# Patient Record
Sex: Female | Born: 1975 | Race: Black or African American | Hispanic: No | Marital: Married | Smoking: Current some day smoker
Health system: Southern US, Community
[De-identification: ages and names within clinical notes are randomized; demographics above are authoritative.]

## PROBLEM LIST (undated history)

## (undated) DIAGNOSIS — M549 Dorsalgia, unspecified: Secondary | ICD-10-CM

## (undated) DIAGNOSIS — Z720 Tobacco use: Secondary | ICD-10-CM

## (undated) DIAGNOSIS — R7303 Prediabetes: Secondary | ICD-10-CM

## (undated) DIAGNOSIS — R0683 Snoring: Secondary | ICD-10-CM

## (undated) DIAGNOSIS — J302 Other seasonal allergic rhinitis: Secondary | ICD-10-CM

## (undated) DIAGNOSIS — M2550 Pain in unspecified joint: Secondary | ICD-10-CM

## (undated) DIAGNOSIS — B9689 Other specified bacterial agents as the cause of diseases classified elsewhere: Secondary | ICD-10-CM

## (undated) DIAGNOSIS — N939 Abnormal uterine and vaginal bleeding, unspecified: Secondary | ICD-10-CM

## (undated) DIAGNOSIS — D219 Benign neoplasm of connective and other soft tissue, unspecified: Secondary | ICD-10-CM

## (undated) DIAGNOSIS — N760 Acute vaginitis: Secondary | ICD-10-CM

## (undated) DIAGNOSIS — K5900 Constipation, unspecified: Secondary | ICD-10-CM

## (undated) DIAGNOSIS — E049 Nontoxic goiter, unspecified: Secondary | ICD-10-CM

## (undated) DIAGNOSIS — G4730 Sleep apnea, unspecified: Secondary | ICD-10-CM

## (undated) DIAGNOSIS — T7840XA Allergy, unspecified, initial encounter: Secondary | ICD-10-CM

## (undated) HISTORY — DX: Abnormal uterine and vaginal bleeding, unspecified: N93.9

## (undated) HISTORY — DX: Sleep apnea, unspecified: G47.30

## (undated) HISTORY — DX: Allergy, unspecified, initial encounter: T78.40XA

## (undated) HISTORY — DX: Pain in unspecified joint: M25.50

## (undated) HISTORY — DX: Tobacco use: Z72.0

## (undated) HISTORY — DX: Dorsalgia, unspecified: M54.9

## (undated) HISTORY — DX: Acute vaginitis: B96.89

## (undated) HISTORY — DX: Snoring: R06.83

## (undated) HISTORY — DX: Nontoxic goiter, unspecified: E04.9

## (undated) HISTORY — DX: Prediabetes: R73.03

## (undated) HISTORY — DX: Other specified bacterial agents as the cause of diseases classified elsewhere: N76.0

## (undated) HISTORY — PX: TUBAL LIGATION: SHX77

## (undated) HISTORY — DX: Constipation, unspecified: K59.00

---

## 2017-12-04 DIAGNOSIS — H40033 Anatomical narrow angle, bilateral: Secondary | ICD-10-CM | POA: Diagnosis not present

## 2017-12-04 DIAGNOSIS — H16223 Keratoconjunctivitis sicca, not specified as Sjogren's, bilateral: Secondary | ICD-10-CM | POA: Diagnosis not present

## 2017-12-05 DIAGNOSIS — H5213 Myopia, bilateral: Secondary | ICD-10-CM | POA: Diagnosis not present

## 2017-12-22 DIAGNOSIS — H1013 Acute atopic conjunctivitis, bilateral: Secondary | ICD-10-CM | POA: Diagnosis not present

## 2017-12-22 DIAGNOSIS — H5203 Hypermetropia, bilateral: Secondary | ICD-10-CM | POA: Diagnosis not present

## 2017-12-25 ENCOUNTER — Encounter (HOSPITAL_COMMUNITY): Payer: Self-pay | Admitting: Emergency Medicine

## 2017-12-25 ENCOUNTER — Ambulatory Visit (HOSPITAL_COMMUNITY)
Admission: EM | Admit: 2017-12-25 | Discharge: 2017-12-25 | Disposition: A | Discharge status: Home / Self Care | Payer: Medicaid Other | Attending: Emergency Medicine | Admitting: Emergency Medicine

## 2017-12-25 DIAGNOSIS — N898 Other specified noninflammatory disorders of vagina: Secondary | ICD-10-CM | POA: Diagnosis not present

## 2017-12-25 DIAGNOSIS — N760 Acute vaginitis: Secondary | ICD-10-CM

## 2017-12-25 DIAGNOSIS — Z792 Long term (current) use of antibiotics: Secondary | ICD-10-CM | POA: Diagnosis not present

## 2017-12-25 DIAGNOSIS — F17200 Nicotine dependence, unspecified, uncomplicated: Secondary | ICD-10-CM | POA: Insufficient documentation

## 2017-12-25 DIAGNOSIS — J029 Acute pharyngitis, unspecified: Secondary | ICD-10-CM

## 2017-12-25 LAB — POCT URINALYSIS DIP (DEVICE)
Bilirubin Urine: NEGATIVE
Glucose, UA: NEGATIVE mg/dL
Hgb urine dipstick: NEGATIVE
Ketones, ur: NEGATIVE mg/dL
Leukocytes, UA: NEGATIVE
Nitrite: NEGATIVE
Protein, ur: NEGATIVE mg/dL
Specific Gravity, Urine: >=1.03 (ref 1.005–1.030)
Urobilinogen, UA: 0.2 mg/dL (ref 0.0–1.0)
pH: 5.5 (ref 5.0–8.0)

## 2017-12-25 MED ORDER — METRONIDAZOLE 500 MG PO TABS: 500.0000 mg | ORAL_TABLET | Freq: Two times a day (BID) | ORAL | 0 refills | Status: AC

## 2017-12-25 NOTE — ED Triage Notes (Signed)
Pt c/o sore throat and vaginal discharge

## 2017-12-25 NOTE — Discharge Instructions (Addendum)
Negative rapid strep here in clinic tonight. Culture has been sent and is in process to confirm this. This appears likely a viral pharyngitis.  Throat lozenges, gargles, chloraseptic spray, warm teas, popsicles etc to help with throat pain.   Your urine is completely normal for indication of bladder or urinary tract infection. Vaginal cytology is pending. Will notify you of any positive findings and if any changes to treatment are needed.  I have sent for BV treatment to be started in the mean time. Do not drink alcohol while taking.

## 2017-12-25 NOTE — ED Provider Notes (Signed)
[place]    CSN: [id number] Arrival date & time: 12/25/17  1752     History   Chief Complaint Chief Complaint  Patient presents with  . Sore Throat  . Vaginal Discharge    HPI Chelsea Henson is a 42 y.o. female.   Chelsea Henson presents witch complaints of sore throat. Started out as itching approximately 1 week ago with runny nose and runny eyes. Felt well over the past few days but today at work her throat increased in pain, up to 6/10 in severity. Felt similar to strep she has had in the past. No known fevers. No gi/gu complaints. No cough. No ear pain. Mild back ache. Took tylenol yesterday which helped. She works in a [employer] and has had ill coworkers.   Also complaints of brown vaginal discharge which started 1 week ago. Similar to BV she has had in the past. States she is sensitive to public toilet paper and had to use it prior to onset of symptoms. No specific urinary symptoms. Denies concerns for STDs.     ROS per HPI.      History reviewed. No pertinent past medical history.  There are no active problems to display for this patient.   History reviewed. No pertinent surgical history.  OB History   None      Home Medications    Prior to Admission medications   Medication Sig Start Date End Date Taking? Authorizing Provider  metroNIDAZOLE (FLAGYL) 500 MG tablet Take 1 tablet (500 mg total) by mouth 2 (two) times daily for 7 days. 12/25/17 01/01/18  Zigmund Gottron, NP    Family History Family History  Problem Relation Age of Onset  . Healthy Mother   . Healthy Father     Social History Social History   Tobacco Use  . Smoking status: Current Some Day Smoker  Substance Use Topics  . Alcohol use: Yes  . Drug use: Never     Allergies   Patient has no known allergies.   Review of Systems Review of Systems   Physical Exam Triage Vital Signs ED Triage Vitals [12/25/17 1837]  Enc Vitals Group     BP 127/80     Pulse Rate  60     Resp 16     Temp 98 F (36.7 C)     Temp src      SpO2 100 %     Weight      Height      Head Circumference      Peak Flow      Pain Score 7     Pain Loc      Pain Edu?      Excl. in [place]?    No data found.  Updated Vital Signs BP 127/80   Pulse 60   Temp 98 F (36.7 C)   Resp 16   SpO2 100%    Physical Exam  Constitutional: She is oriented to person, place, and time. She appears well-developed and well-nourished. No distress.  HENT:  Head: Normocephalic and atraumatic.  Right Ear: Tympanic membrane, external ear and ear canal normal.  Left Ear: Tympanic membrane, external ear and ear canal normal.  Nose: Nose normal.  Mouth/Throat: Uvula is midline, oropharynx is clear and moist and mucous membranes are normal. Tonsils are 1+ on the right. Tonsils are 1+ on the left. No tonsillar exudate.  Eyes: Pupils are equal, round, and reactive to light. Conjunctivae and EOM are normal.  Cardiovascular:  Normal rate, regular rhythm and normal heart sounds.  Pulmonary/Chest: Effort normal and breath sounds normal.  Abdominal: Soft. There is no tenderness. There is no rigidity, no rebound, no guarding and no CVA tenderness.  Genitourinary:  Genitourinary Comments: Denies sores, lesions, vaginal bleeding; no pelvic pain; gu exam deferred at this time, vaginal self swab collected.    Neurological: She is alert and oriented to person, place, and time.  Skin: Skin is warm and dry.     UC Treatments / Results  Labs (all labs ordered are listed, but only abnormal results are displayed) Labs Reviewed  CULTURE, GROUP A STREP [facility])  POCT URINALYSIS DIP (DEVICE)  CERVICOVAGINAL ANCILLARY ONLY    EKG None  Radiology No results found.  Procedures Procedures (including critical care time)  Medications Ordered in UC Medications - No data to display  Initial Impression / Assessment and Plan / UC Course  I have reviewed the triage vital signs and the nursing  notes.  Pertinent labs & imaging results that were available during my care of the patient were reviewed by me and considered in my medical decision making (see chart for details).     Negative rapid strep, consistent with exam. History and physical consistent with viral illness.  Supportive cares recommended. Concern for BV, vaginal cytology pending, flagyl initiated. If symptoms worsen or do not improve in the next week to return to be seen or to follow up with PCP.  Patient verbalized understanding and agreeable to plan.   Final Clinical Impressions(s) / UC Diagnoses   Final diagnoses:  Pharyngitis, unspecified etiology  Acute vaginitis     Discharge Instructions     Negative rapid strep here in clinic tonight. Culture has been sent and is in process to confirm this. This appears likely a viral pharyngitis.  Throat lozenges, gargles, chloraseptic spray, warm teas, popsicles etc to help with throat pain.   Your urine is completely normal for indication of bladder or urinary tract infection. Vaginal cytology is pending. Will notify you of any positive findings and if any changes to treatment are needed.  I have sent for BV treatment to be started in the mean time. Do not drink alcohol while taking.      ED Prescriptions    Medication Sig Dispense Auth. Provider   metroNIDAZOLE (FLAGYL) 500 MG tablet Take 1 tablet (500 mg total) by mouth 2 (two) times daily for 7 days. 14 tablet Zigmund Gottron, NP     Controlled Substance Prescriptions [place] Controlled Substance Registry consulted? Not Applicable   Zigmund Gottron, NP 12/25/17 [phone]

## 2017-12-26 LAB — POCT RAPID STREP A: Streptococcus, Group A Screen (Direct): NEGATIVE

## 2017-12-27 LAB — CERVICOVAGINAL ANCILLARY ONLY
Bacterial vaginitis: NEGATIVE
Candida vaginitis: NEGATIVE
Chlamydia: NEGATIVE
Neisseria Gonorrhea: NEGATIVE
Trichomonas: NEGATIVE

## 2017-12-28 LAB — CULTURE, GROUP A STREP (THRC)

## 2018-02-26 ENCOUNTER — Other Ambulatory Visit: Payer: Self-pay

## 2018-02-26 ENCOUNTER — Ambulatory Visit (HOSPITAL_COMMUNITY)
Admission: EM | Admit: 2018-02-26 | Discharge: 2018-02-26 | Disposition: A | Discharge status: Home / Self Care | Payer: Medicaid Other | Attending: Family Medicine | Admitting: Family Medicine

## 2018-02-26 DIAGNOSIS — N760 Acute vaginitis: Secondary | ICD-10-CM | POA: Insufficient documentation

## 2018-02-26 MED ORDER — FLUCONAZOLE 150 MG PO TABS: 150.0000 mg | ORAL_TABLET | Freq: Every day | ORAL | 0 refills | Status: DC

## 2018-02-26 MED ORDER — METRONIDAZOLE 500 MG PO TABS: 500.0000 mg | ORAL_TABLET | Freq: Two times a day (BID) | ORAL | 1 refills | Status: DC

## 2018-02-26 NOTE — Discharge Instructions (Signed)
We will go ahead and treat you for BV today based on symptoms and history Also given Diflucan for yeast infection Follow up as needed for continued or worsening symptoms

## 2018-02-26 NOTE — ED Triage Notes (Signed)
Pt cc she thinks she has BV.

## 2018-02-28 NOTE — ED Provider Notes (Signed)
[place]    CSN: [id number] Arrival date & time: 02/26/18  1651     History   Chief Complaint Chief Complaint  Patient presents with  . Vaginitis    HPI Chelsea Henson is a 43 y.o. female.   Pt is a 43 year old that presents with vaginal discharge and odor. She has a hx of BV and this is a recurrent problem for her. Her symptoms started 2 days ago after using a specific toilet paper that she reports is allergic to. This has happened before. She has also had some mild itching and irritation. Her menstrual cycles are irregular and she is premenopausal. She is currently sexually active but denies any concern for STDs. She is not having any abdominal pain, back pain, pelvic pain. No fever, chill, nausea. No urinary symptoms.   ROS per HPI      No past medical history on file.  There are no active problems to display for this patient.   No past surgical history on file.  OB History   No obstetric history on file.      Home Medications    Prior to Admission medications   Medication Sig Start Date End Date Taking? Authorizing Provider  fluconazole (DIFLUCAN) 150 MG tablet Take 1 tablet (150 mg total) by mouth daily. 02/26/18   Loura Halt A, NP  metroNIDAZOLE (FLAGYL) 500 MG tablet Take 1 tablet (500 mg total) by mouth 2 (two) times daily. 02/26/18   Orvan July, NP    Family History Family History  Problem Relation Age of Onset  . Healthy Mother   . Healthy Father     Social History Social History   Tobacco Use  . Smoking status: Current Some Day Smoker  Substance Use Topics  . Alcohol use: Yes  . Drug use: Never     Allergies   Patient has no known allergies.   Review of Systems Review of Systems   Physical Exam Triage Vital Signs ED Triage Vitals  Enc Vitals Group     BP 02/26/18 1750 130/68     Pulse Rate 02/26/18 1750 68     Resp 02/26/18 1750 18     Temp 02/26/18 1750 97.9 F (36.6 C)     Temp src --      SpO2 02/26/18  1750 100 %     Weight 02/26/18 1749 220 lb (99.8 kg)     Height --      Head Circumference --      Peak Flow --      Pain Score 02/26/18 1750 6     Pain Loc --      Pain Edu? --      Excl. in [place]? --    No data found.  Updated Vital Signs BP 130/68 (BP Location: Right Arm)   Pulse 68   Temp 97.9 F (36.6 C)   Resp 18   Wt 224 lb (101.6 kg)   SpO2 100%   Visual Acuity Right Eye Distance:   Left Eye Distance:   Bilateral Distance:    Right Eye Near:   Left Eye Near:    Bilateral Near:     Physical Exam Vitals signs and nursing note reviewed.  Constitutional:      General: She is not in acute distress.    Appearance: She is well-developed.  HENT:     Head: Normocephalic and atraumatic.  Eyes:     Conjunctiva/sclera: Conjunctivae normal.  Neck:  Musculoskeletal: Neck supple.  Cardiovascular:     Rate and Rhythm: Normal rate and regular rhythm.     Heart sounds: No murmur.  Pulmonary:     Effort: Pulmonary effort is normal. No respiratory distress.     Breath sounds: Normal breath sounds.  Abdominal:     Palpations: Abdomen is soft.     Tenderness: There is no abdominal tenderness.  Genitourinary:    Comments: deferred Musculoskeletal: Normal range of motion.  Skin:    General: Skin is warm and dry.  Neurological:     Mental Status: She is alert.  Psychiatric:        Mood and Affect: Mood normal.      UC Treatments / Results  Labs (all labs ordered are listed, but only abnormal results are displayed) Labs Reviewed - No data to display  EKG None  Radiology No results found.  Procedures Procedures (including critical care time)  Medications Ordered in UC Medications - No data to display  Initial Impression / Assessment and Plan / UC Course  I have reviewed the triage vital signs and the nursing notes.  Pertinent labs & imaging results that were available during my care of the patient were reviewed by me and considered in my medical  decision making (see chart for details).     Treating for BV and yeast today No testing performed.  Follow up as needed for continued or worsening symptoms  Final Clinical Impressions(s) / UC Diagnoses   Final diagnoses:  Vaginitis and vulvovaginitis     Discharge Instructions     We will go ahead and treat you for BV today based on symptoms and history Also given Diflucan for yeast infection Follow up as needed for continued or worsening symptoms     ED Prescriptions    Medication Sig Dispense Auth. Provider   metroNIDAZOLE (FLAGYL) 500 MG tablet Take 1 tablet (500 mg total) by mouth 2 (two) times daily. 14 tablet Bast, Traci A, NP   fluconazole (DIFLUCAN) 150 MG tablet Take 1 tablet (150 mg total) by mouth daily. 2 tablet Orvan July, NP     Controlled Substance Prescriptions [place] Controlled Substance Registry consulted? no   Orvan July, NP 02/28/18 1501

## 2018-08-31 ENCOUNTER — Encounter (HOSPITAL_COMMUNITY): Payer: Self-pay

## 2018-08-31 ENCOUNTER — Ambulatory Visit (HOSPITAL_COMMUNITY)
Admission: EM | Admit: 2018-08-31 | Discharge: 2018-08-31 | Disposition: A | Discharge status: Home / Self Care | Payer: Medicaid Other | Attending: Family Medicine | Admitting: Family Medicine

## 2018-08-31 DIAGNOSIS — M542 Cervicalgia: Secondary | ICD-10-CM

## 2018-08-31 DIAGNOSIS — R0789 Other chest pain: Secondary | ICD-10-CM | POA: Diagnosis not present

## 2018-08-31 MED ORDER — TIZANIDINE HCL 4 MG PO TABS: 4.0000 mg | ORAL_TABLET | Freq: Four times a day (QID) | ORAL | 0 refills | Status: DC | PRN

## 2018-08-31 MED ORDER — NAPROXEN 500 MG PO TABS: 500.0000 mg | ORAL_TABLET | Freq: Two times a day (BID) | ORAL | 0 refills | Status: DC

## 2018-08-31 NOTE — ED Provider Notes (Signed)
[place]    CSN: [id number] Arrival date & time: 08/31/18  1146      History   Chief Complaint Chief Complaint  Patient presents with  . Chest Pain    HPI Chelsea Henson is a 43 y.o. female.   HPI Patient admits to a lot of stress at her job.  She states for the last several days she got stiffness in her neck.  Neck and shoulders.  Pain moving her neck.  She states this felt like muscle soreness.  She had to turn her whole body in order to look from one direction to the other.  No numbness or weakness into the arms.  No headache.  No injury.  She states that a couple days ago the pain seemed to radiate to her chest.  She gets a sharp pain that comes and goes.  Not associate with shortness of breath.  No radiation.  Not exertional. She has no cardiac risk factors.  Non-smoking.  No family history.  No hypertension diabetes or high cholesterol. History reviewed. No pertinent past medical history.  There are no active problems to display for this patient.   History reviewed. No pertinent surgical history.  OB History   No obstetric history on file.      Home Medications    Prior to Admission medications   Medication Sig Start Date End Date Taking? Authorizing Provider  naproxen (NAPROSYN) 500 MG tablet Take 1 tablet (500 mg total) by mouth 2 (two) times daily. 08/31/18   Raylene Everts, MD  tiZANidine (ZANAFLEX) 4 MG tablet Take 1-2 tablets (4-8 mg total) by mouth every 6 (six) hours as needed for muscle spasms. 08/31/18   Raylene Everts, MD    Family History Family History  Problem Relation Age of Onset  . Healthy Mother   . Healthy Father     Social History Social History   Tobacco Use  . Smoking status: Current Some Day Smoker  . Smokeless tobacco: Current User  Substance Use Topics  . Alcohol use: Yes  . Drug use: Never     Allergies   Patient has no known allergies.   Review of Systems Review of Systems  Constitutional: Negative  for chills and fever.  HENT: Negative for ear pain and sore throat.   Eyes: Negative for pain and visual disturbance.  Respiratory: Negative for cough and shortness of breath.   Cardiovascular: Positive for chest pain. Negative for palpitations and leg swelling.  Gastrointestinal: Negative for abdominal pain and vomiting.  Genitourinary: Negative for dysuria and hematuria.  Musculoskeletal: Positive for neck pain and neck stiffness. Negative for arthralgias and back pain.  Skin: Negative for color change and rash.  Neurological: Negative for seizures and syncope.  All other systems reviewed and are negative.    Physical Exam Triage Vital Signs ED Triage Vitals  Enc Vitals Group     BP 08/31/18 1227 107/65     Pulse Rate 08/31/18 1227 65     Resp -- 16     Temp 08/31/18 1227 99.1 F (37.3 C)     Temp Source 08/31/18 1227 Oral     SpO2 08/31/18 1227 99 %     Weight --      Height --      Head Circumference --      Peak Flow --      Pain Score 08/31/18 1304 0     Pain Loc --      Pain Edu? --  Excl. in [place]? --    No data found.  Updated Vital Signs BP 107/65 (BP Location: Left Arm)   Pulse 65   Temp 99.1 F (37.3 C) (Oral)   SpO2 99%      Physical Exam Constitutional:      General: She is not in acute distress.    Appearance: She is well-developed.     Comments: Overweight.  Calm in no distress  HENT:     Head: Normocephalic and atraumatic.  Eyes:     Conjunctiva/sclera: Conjunctivae normal.     Pupils: Pupils are equal, round, and reactive to light.  Neck:     Musculoskeletal: Normal range of motion.     Thyroid: No thyromegaly.     Comments: Slow but full range of motion.  Tenderness in the upper body of the trapezius bilaterally and in the paraspinous cervical muscles Cardiovascular:     Rate and Rhythm: Normal rate and regular rhythm.  No extrasystoles are present.    Heart sounds: Normal heart sounds. No murmur.     Comments: No chest wall tenderness  Pulmonary:     Effort: Pulmonary effort is normal. No respiratory distress.     Breath sounds: Normal breath sounds.  Abdominal:     General: Bowel sounds are normal. There is no distension.     Palpations: Abdomen is soft.     Tenderness: There is no abdominal tenderness.  Musculoskeletal: Normal range of motion.  Skin:    General: Skin is warm and dry.  Neurological:     General: No focal deficit present.     Mental Status: She is alert.  Psychiatric:        Behavior: Behavior normal.      UC Treatments / Results  Labs (all labs ordered are listed, but only abnormal results are displayed) Labs Reviewed - No data to display  EKG EKG is normal sinus rhythm.  Normal intervals.  No ST or T wave changes   Radiology No results found.  Procedures Procedures (including critical care time)  Medications Ordered in UC Medications - No data to display  Initial Impression / Assessment and Plan / UC Course  I have reviewed the triage vital signs and the nursing notes.  Pertinent labs & imaging results that were available during my care of the patient were reviewed by me and considered in my medical decision making (see chart for details).     I explained to the patient that this is not typical for cardiac chest pain.  I believe she is having musculoskeletal pain.  I think is related to her work stress.  Advised her to treat this conservatively.  She is going to be off for a couple of days.  To come to the ER if she is worse instead of better Final Clinical Impressions(s) / UC Diagnoses   Final diagnoses:  Chest wall pain  Musculoskeletal neck pain     Discharge Instructions     Take naproxen 2 times a day with food Take muscle relaxer at bedtime.  May use during the day if needed.  Caution drowsiness Ice or heat to painful muscles Call your doctor if not better by next week   ED Prescriptions    Medication Sig Dispense Auth. Provider   tiZANidine (ZANAFLEX) 4 MG  tablet Take 1-2 tablets (4-8 mg total) by mouth every 6 (six) hours as needed for muscle spasms. 21 tablet Raylene Everts, MD   naproxen (NAPROSYN) 500 MG tablet Take  1 tablet (500 mg total) by mouth 2 (two) times daily. 30 tablet Raylene Everts, MD     Controlled Substance Prescriptions [place] Controlled Substance Registry consulted? Not Applicable   Raylene Everts, MD 08/31/18 2046

## 2018-08-31 NOTE — ED Triage Notes (Signed)
Pt said her neck got stiff and started hurting then the pain radiated to the middle of her chest. A sharp pain that started about a week ago.

## 2018-08-31 NOTE — Discharge Instructions (Signed)
Take naproxen 2 times a day with food Take muscle relaxer at bedtime.  May use during the day if needed.  Caution drowsiness Ice or heat to painful muscles Call your doctor if not better by next week

## 2018-09-17 ENCOUNTER — Encounter: Payer: Self-pay | Admitting: Family Medicine

## 2018-09-17 ENCOUNTER — Ambulatory Visit: Payer: Medicaid Other | Admitting: Family Medicine

## 2018-09-17 ENCOUNTER — Other Ambulatory Visit: Payer: Self-pay

## 2018-09-17 VITALS — BP 112/78 | HR 71 | Ht 66.0 in | Wt 222.4 lb

## 2018-09-17 DIAGNOSIS — Z87891 Personal history of nicotine dependence: Secondary | ICD-10-CM

## 2018-09-17 DIAGNOSIS — R7303 Prediabetes: Secondary | ICD-10-CM | POA: Insufficient documentation

## 2018-09-17 DIAGNOSIS — E049 Nontoxic goiter, unspecified: Secondary | ICD-10-CM

## 2018-09-17 DIAGNOSIS — N6459 Other signs and symptoms in breast: Secondary | ICD-10-CM

## 2018-09-17 DIAGNOSIS — Z720 Tobacco use: Secondary | ICD-10-CM

## 2018-09-17 DIAGNOSIS — Z0000 Encounter for general adult medical examination without abnormal findings: Secondary | ICD-10-CM | POA: Insufficient documentation

## 2018-09-17 DIAGNOSIS — Z1239 Encounter for other screening for malignant neoplasm of breast: Secondary | ICD-10-CM

## 2018-09-17 DIAGNOSIS — N939 Abnormal uterine and vaginal bleeding, unspecified: Secondary | ICD-10-CM | POA: Diagnosis not present

## 2018-09-17 DIAGNOSIS — N611 Abscess of the breast and nipple: Secondary | ICD-10-CM | POA: Insufficient documentation

## 2018-09-17 DIAGNOSIS — N63 Unspecified lump in unspecified breast: Secondary | ICD-10-CM

## 2018-09-17 LAB — POCT GLYCOSYLATED HEMOGLOBIN (HGB A1C): Hemoglobin A1C: 5.8 % — AB (ref 4.0–5.6)

## 2018-09-17 NOTE — Assessment & Plan Note (Signed)
Currently in the action stage.  Will check in at each appointment to see if she has continued to abstain from smoking.  Will offer other resources as needed.

## 2018-09-17 NOTE — Patient Instructions (Addendum)
It was nice seeing you today Chelsea Henson!  Today, we are getting several labs, including checking your blood count, checking your cholesterol, checking your hemoglobin A1c, checking your kidney function, and checking for HIV.  We will discuss the results of these labs at your visit on Thursday.  I have also referred you to get a mammogram.  We will do a pelvic exam and a breast exam on Thursday and will likely order an ultrasound for further investigation of your abnormal uterine bleeding.  If you have any questions or concerns, please feel free to call the clinic.   Be well,  Dr. Shan Levans

## 2018-09-17 NOTE — Assessment & Plan Note (Addendum)
Since this was a new patient appointment, unable to perform pelvic exam today.  Have scheduled her to return on 8/13 for this exam to be performed.  Patient may also need an ultrasound for further evaluation.  We will also perform a wet prep and STI testing on 8/13.  Will obtain a CBC today.

## 2018-09-17 NOTE — Assessment & Plan Note (Signed)
Will perform breast exam on 8/13.  I have also referred patient to get a mammogram for further evaluation.

## 2018-09-17 NOTE — Assessment & Plan Note (Addendum)
Will obtain hemoglobin A1c today and use this result to guide whether or not patient should restart metformin or continue with lifestyle changes.  Will obtain BMP to assess kidney function as well.

## 2018-09-17 NOTE — Progress Notes (Signed)
Subjective:    Chelsea Henson - 43 y.o. female MRN [id number]  Date of birth: [birth year]  CC:  Chelsea Henson is here to establish care.  She would also like to discuss R breast lump and tenderness, frequent bacterial vaginosis, abnormal uterine bleeding.  HPI:  R breast lump Says that she knocked her right breast against the side of a door a couple weeks ago, and she has an area close to her nipple that is still tender and a little swollen  Frequent BV Has had frequent infections and will get a yeast infection when treated with metronidazole.  Would like a pelvic exam to check for BV.  Abnormal uterine bleeding Has been told that she may be perimenopausal, and she has had irregular menstrual periods since fall of last year.  However, she says that she is bleeding much more than normal now and periods will last up to 8 days.  Sometimes she will go through an overnight pad and as quickly as 15 minutes.  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  Smoked cigarettes off and on for the last 25 years, working on quitting now, using vape pen to quit currently, drinks alcohol occasionally, less than 3 drinks per week  Health Maintenance:  - patient was provided with mammogram information Health Maintenance Due  Topic Date Due  . HIV Screening  03/17/1989  . TETANUS/TDAP  03/17/1993  . INFLUENZA VACCINE  09/08/2018    -  reports that she has been smoking. She uses smokeless tobacco. Review of Systems  Constitutional: Negative for chills, fever and weight loss.  HENT: Negative for congestion.   Respiratory:  Negative for cough.   Cardiovascular: Negative for chest pain.  Gastrointestinal: Negative for abdominal pain.  Genitourinary: Negative for dysuria.  Musculoskeletal: Positive for neck pain.  Neurological: Negative for weakness.  Psychiatric/Behavioral: Negative for depression.     - Past Medical History: Patient Active Problem List   Diagnosis Date Noted  . Abnormal uterine bleeding   . Tobacco abuse   . Prediabetes   . Enlarged thyroid   . Breast lump   . Healthcare maintenance    - Medications: reviewed and updated   Objective:   Physical Exam BP 112/78   Pulse 71   Ht 5\' 6"  (1.676 m)   Wt 222 lb 6.4 oz (100.9 kg)   SpO2 96%   BMI 35.90 kg/m  Gen: NAD, alert, cooperative with exam, well-appearing, pleasant, obese HEENT: NCAT, PERRL, clear conjunctiva,  supple neck CV: RRR, good S1/S2, no murmur, no edema, capillary refill brisk  Resp: CTABL, no wheezes, non-labored Abd: SNTND, BS present, no guarding or organomegaly Skin: no rashes, normal turgor  Neuro: no gross deficits.  Psych: good insight, alert and oriented        Assessment & Plan:   Prediabetes Will obtain hemoglobin A1c today and use this result to guide whether or not patient should restart metformin or continue with lifestyle changes.  Will obtain BMP to assess kidney function as well.  Tobacco abuse Currently in the action stage.  Will check in at each  appointment to see if she has continued to abstain from smoking.  Will offer other resources as needed.  Abnormal uterine bleeding Since this was a new patient appointment, unable to perform pelvic exam today.  Have scheduled her to return on 8/13 for this exam to be performed.  Patient may also need an ultrasound for further evaluation.  We will also perform a wet prep and STI testing on 8/13.  Will obtain a CBC today.  Breast lump Will perform breast exam on 8/13.  I have also referred patient to get a mammogram for further evaluation.   Healthcare maintenance Will obtain BMP, CBC, TSH, lipid profile, hemoglobin A1c, and HIV today.    Maia Breslow, M.D. 09/17/2018, 11:51 AM PGY-3, [facility]

## 2018-09-17 NOTE — Assessment & Plan Note (Signed)
Will obtain BMP, CBC, TSH, lipid profile, hemoglobin A1c, and HIV today.

## 2018-09-18 LAB — BASIC METABOLIC PANEL
BUN/Creatinine Ratio: 20 (ref 9–23)
BUN: 14 mg/dL (ref 6–24)
CO2: 26 mmol/L (ref 20–29)
Calcium: 9.6 mg/dL (ref 8.7–10.2)
Chloride: 105 mmol/L (ref 96–106)
Creatinine, Ser: 0.71 mg/dL (ref 0.57–1.00)
GFR calc Af Amer: 120 mL/min/{1.73_m2} (ref >59)
GFR calc non Af Amer: 104 mL/min/{1.73_m2} (ref >59)
Glucose: 96 mg/dL (ref 65–99)
Potassium: 5.3 mmol/L — ABNORMAL HIGH (ref 3.5–5.2)
Sodium: 140 mmol/L (ref 134–144)

## 2018-09-18 LAB — LIPID PANEL
Chol/HDL Ratio: 4.7 ratio — ABNORMAL HIGH (ref 0.0–4.4)
Cholesterol, Total: 150 mg/dL (ref 100–199)
HDL: 32 mg/dL — ABNORMAL LOW (ref >39)
LDL Calculated: 101 mg/dL — ABNORMAL HIGH (ref 0–99)
Triglycerides: 86 mg/dL (ref 0–149)
VLDL Cholesterol Cal: 17 mg/dL (ref 5–40)

## 2018-09-18 LAB — CBC
Hematocrit: 35.2 % (ref 34.0–46.6)
Hemoglobin: 11.1 g/dL (ref 11.1–15.9)
MCH: 26.6 pg (ref 26.6–33.0)
MCHC: 31.5 g/dL (ref 31.5–35.7)
MCV: 84 fL (ref 79–97)
Platelets: 334 10*3/uL (ref 150–450)
RBC: 4.18 x10E6/uL (ref 3.77–5.28)
RDW: 13.9 % (ref 11.7–15.4)
WBC: 4.8 10*3/uL (ref 3.4–10.8)

## 2018-09-18 LAB — TSH: TSH: 0.397 u[IU]/mL — ABNORMAL LOW (ref 0.450–4.500)

## 2018-09-18 LAB — HIV ANTIBODY (ROUTINE TESTING W REFLEX): HIV Screen 4th Generation wRfx: NONREACTIVE

## 2018-09-20 ENCOUNTER — Other Ambulatory Visit: Payer: Self-pay | Admitting: Family Medicine

## 2018-09-20 ENCOUNTER — Encounter: Payer: Self-pay | Admitting: Family Medicine

## 2018-09-20 ENCOUNTER — Other Ambulatory Visit: Payer: Self-pay

## 2018-09-20 ENCOUNTER — Other Ambulatory Visit (HOSPITAL_COMMUNITY)
Admission: RE | Admit: 2018-09-20 | Discharge: 2018-09-20 | Disposition: A | Discharge status: Home / Self Care | Payer: Medicaid Other | Source: Ambulatory Visit | Attending: Family Medicine | Admitting: Family Medicine

## 2018-09-20 ENCOUNTER — Ambulatory Visit: Payer: Medicaid Other | Admitting: Family Medicine

## 2018-09-20 VITALS — BP 104/60 | HR 73 | Wt 219.4 lb

## 2018-09-20 DIAGNOSIS — Z113 Encounter for screening for infections with a predominantly sexual mode of transmission: Secondary | ICD-10-CM

## 2018-09-20 DIAGNOSIS — N939 Abnormal uterine and vaginal bleeding, unspecified: Secondary | ICD-10-CM | POA: Diagnosis not present

## 2018-09-20 DIAGNOSIS — Z0000 Encounter for general adult medical examination without abnormal findings: Secondary | ICD-10-CM

## 2018-09-20 DIAGNOSIS — E049 Nontoxic goiter, unspecified: Secondary | ICD-10-CM | POA: Diagnosis not present

## 2018-09-20 DIAGNOSIS — L680 Hirsutism: Secondary | ICD-10-CM

## 2018-09-20 DIAGNOSIS — N63 Unspecified lump in unspecified breast: Secondary | ICD-10-CM

## 2018-09-20 LAB — POCT WET PREP (WET MOUNT)
Clue Cells Wet Prep Whiff POC: NEGATIVE
Trichomonas Wet Prep HPF POC: ABSENT

## 2018-09-20 MED ORDER — FLUCONAZOLE 150 MG PO TABS: ORAL_TABLET | ORAL | 0 refills | Status: DC

## 2018-09-20 NOTE — Addendum Note (Signed)
Addended by: Maia Breslow C on: 09/20/2018 09:31 AM   Modules accepted: Orders

## 2018-09-20 NOTE — Assessment & Plan Note (Signed)
Hirsutism may be due to PCOS or an undiagnosed metabolic disorder.  Patient is prediabetic, which could support a diagnosis of PCOS.  Transvaginal ultrasound ultrasound will also help visualize any cysts in the ovaries.  Will obtain testosterone level today.

## 2018-09-20 NOTE — Assessment & Plan Note (Signed)
Reviewed patient's labs from her prior visit, including CBC, BMP, HIV, and lipid profile.  Although HDL is slightly low and LDL is slightly high, her ASCVD risk score is only about 1 to 2%, and this was reviewed with patient.  A1c was 5.8, so she does not currently need to start metformin.  Will obtain RPR today per patient request.

## 2018-09-20 NOTE — Patient Instructions (Addendum)
It was nice seeing you today Chelsea Henson!  Today, we tested you for bacterial vaginosis, yeast infection, trichomonas, gonorrhea, chlamydia, and syphilis.  We will also test your thyroid level and your testosterone level.  I will let you know what these results are when they return.  If you wish to have your unwanted hair permanently removed, [facility] at [address] [place], [place] [address] does laser hair removal, although this is not covered by insurance.   We have scheduled your transvaginal ultrasound to investigate for any source of your uterine bleeding.  Please schedule your mammogram at your convenience to further investigate your right breast pain.  If you have any questions or concerns, please feel free to call the clinic.   Be well,  Dr. Shan Levans

## 2018-09-20 NOTE — Assessment & Plan Note (Signed)
Wet prep and GC/chlamydia were collected today.  Transvaginal ultrasound was scheduled.  We will follow-up with patient regarding these results.

## 2018-09-20 NOTE — Progress Notes (Signed)
Subjective:    Chelsea Henson - 43 y.o. female MRN [id number]  Date of birth: December 19, 1975  CC:  Chelsea Henson is here for follow-up of abnormal uterine bleeding and right breast pain as well as discussing unwanted body hair.  We will also be discussing her recent labs.  HPI: Abnormal uterine bleeding Patient says that she continues to have a fishy odor and dark brown bleeding that lingers long after her period is over.  She frequently has bacterial vaginosis and thinks this could be a possible cause of her symptoms.  She denies abdominal pain or lightheadedness.  She is not currently sexually active but would like to be tested for STIs.  Right breast pain Patient continues to have some pain around her right areole a.  There are no skin changes, but she says that she can feel a small lump in that area.  She thinks that this is from hitting her breast against a door a few weeks ago but is concerned that her pain has lingered this long.  She denies nipple discharge.  Unwanted body hair Patient says that she has had hair on her neck and chin as well as on her chest for several years.  She says that her mother has also had some unwanted facial hair.  She currently waxes these areas.  Health Maintenance:  Health Maintenance Due  Topic Date Due  . TETANUS/TDAP  03/17/1993  . INFLUENZA VACCINE  09/08/2018    -  reports that she has been smoking. She uses smokeless tobacco. - Review of Systems: Per HPI. - Past Medical History: Patient Active Problem List   Diagnosis Date Noted  . Hirsutism 09/20/2018  . Abnormal uterine bleeding   . Tobacco abuse   . Prediabetes   . Enlarged thyroid   . Breast lump   . Healthcare maintenance    - Medications: reviewed and updated   Objective:   Physical Exam BP 104/60   Pulse 73   Wt 219 lb 6 oz (99.5 kg)   SpO2 98%   BMI 35.41 kg/m  Gen: NAD, alert, cooperative with exam, well-appearing, pleasant, obese Breast: Right and left  breasts appear normal, although some hair is noted on each breast.  No skin changes or nipple discharge is noted.  Some irregularity is noted in both breasts, but no discrete lumps are identified.  There is some tenderness to palpation on the upper and lower outer quadrants of the right breast.  Left breast is nontender to palpation.  No axillary lymphadenopathy is palpated. GU: Normal vulva, vagina, and cervix without lesions.  Scant amount of light brown discharge is noted from the cervical os.  No cervical motion tenderness.   Assessment & Plan:   Enlarged thyroid TSH is slightly low at 0.397.  Will obtain free T4 today.  Abnormal uterine bleeding Wet prep and GC/chlamydia were collected today.  Transvaginal ultrasound was scheduled.  We will follow-up with patient regarding these results.  Hirsutism Hirsutism may be due to PCOS or an undiagnosed metabolic disorder.  Patient is prediabetic, which could support a diagnosis of PCOS.  Transvaginal ultrasound ultrasound will also help visualize any cysts in the ovaries.  Will obtain testosterone level today.   Healthcare maintenance Reviewed patient's labs from her prior visit, including CBC, BMP, HIV, and lipid profile.  Although HDL is slightly low and LDL is slightly high, her ASCVD risk score is only about 1 to 2%, and this was reviewed with patient.  A1c was  5.8, so she does not currently need to start metformin.  Will obtain RPR today per patient request.    Maia Breslow, M.D. 09/20/2018, 9:23 AM PGY-3, [place]

## 2018-09-20 NOTE — Assessment & Plan Note (Signed)
TSH is slightly low at 0.397.  Will obtain free T4 today.

## 2018-09-21 LAB — CERVICOVAGINAL ANCILLARY ONLY
Chlamydia: NEGATIVE
Neisseria Gonorrhea: NEGATIVE

## 2018-09-21 LAB — RPR: RPR Ser Ql: NONREACTIVE

## 2018-09-21 LAB — TESTOSTERONE: Testosterone: 13 ng/dL (ref 8–48)

## 2018-09-21 LAB — T4, FREE: Free T4: 1.74 ng/dL (ref 0.82–1.77)

## 2018-09-24 ENCOUNTER — Telehealth: Payer: Self-pay

## 2018-09-24 NOTE — Telephone Encounter (Signed)
-----   Message from Kathrene Alu, MD sent at 09/24/2018  2:47 PM EDT ----- Please let Ms. Piloto know that her results are all normal.  Thanks.

## 2018-09-24 NOTE — Telephone Encounter (Signed)
LVM for pt to call the office. If pt calls, please have her speak to white team member or let her know her results are normal. Ottis Stain, [place]

## 2018-09-25 ENCOUNTER — Telehealth: Payer: Self-pay

## 2018-09-25 NOTE — Telephone Encounter (Signed)
Pt called back and I informed her of below and she said that she was already aware and also there were 2 different notes about this.  Also while on phone pt wanted to schedule an appointment and she is scheduled on 10/01/2018. April Zimmerman Rumple, CMA

## 2018-09-25 NOTE — Telephone Encounter (Signed)
2nd attempt to reach pt to inform her of below.April Zimmerman Rumple, CMA

## 2018-09-25 NOTE — Telephone Encounter (Signed)
LVM for pt to call the office. If pt calls, please let Ms. Demeter know that her results are all normal. Ottis Stain, CMA

## 2018-09-25 NOTE — Telephone Encounter (Signed)
-----   Message from Kathrene Alu, MD sent at 09/24/2018  2:47 PM EDT ----- Please let Ms. Doubrava know that her results are all normal.  Thanks.

## 2018-09-25 NOTE — Telephone Encounter (Signed)
Pt informed.  Jessica Fleeger, CMA  

## 2018-09-27 ENCOUNTER — Other Ambulatory Visit: Payer: Medicaid Other

## 2018-10-01 ENCOUNTER — Ambulatory Visit: Payer: Medicaid Other | Admitting: Family Medicine

## 2018-10-01 ENCOUNTER — Ambulatory Visit (INDEPENDENT_AMBULATORY_CARE_PROVIDER_SITE_OTHER): Payer: Medicaid Other | Admitting: Family Medicine

## 2018-10-01 ENCOUNTER — Other Ambulatory Visit: Payer: Self-pay

## 2018-10-01 ENCOUNTER — Encounter: Payer: Self-pay | Admitting: Family Medicine

## 2018-10-01 DIAGNOSIS — L299 Pruritus, unspecified: Secondary | ICD-10-CM

## 2018-10-01 MED ORDER — CETIRIZINE HCL 10 MG PO TABS: 10.0000 mg | ORAL_TABLET | Freq: Every day | ORAL | 1 refills | Status: DC

## 2018-10-01 NOTE — Progress Notes (Signed)
   Subjective:    Chelsea Henson - 43 y.o. female MRN [id number]  Date of birth: 01-05-1976  CC:  Chelsea Henson is here for episodic itching.  HPI: Has occurred for several years Is very intermittent without any pattern that patient can recall Sometimes exacerbated by a hot shower, but unknown if there are other exacerbating factors No rash appears when she has these episodes Feels like she is itching "from the inside out" Also will involve her vulvar area after she uses a certain type of toilet paper Has tried Benadryl in the past to alleviate her symptoms, which has been somewhat effective  Health Maintenance: Health Maintenance Due  Topic Date Due  . TETANUS/TDAP  03/17/1993  . INFLUENZA VACCINE  09/08/2018    -  reports that she has been smoking. She uses smokeless tobacco. - Review of Systems: Per HPI. - Past Medical History: Patient Active Problem List   Diagnosis Date Noted  . Itching 10/02/2018  . Hirsutism 09/20/2018  . Abnormal uterine bleeding   . Tobacco abuse   . Prediabetes   . Enlarged thyroid   . Breast lump   . Healthcare maintenance    - Medications: reviewed and updated   Objective:   Physical Exam BP 120/72   Pulse 64   Ht 5\' 6"  (1.676 m)   Wt 219 lb (99.3 kg)   SpO2 94%   BMI 35.35 kg/m  Gen: NAD, alert, cooperative with exam, well-appearing CV: RRR, good S1/S2, no murmur Resp: CTABL, no wheezes, non-labored Skin: no rashes, normal turgor  Psych: good insight, alert and oriented, mildly anxious affect     Assessment & Plan:   Itching Asked patient to record her itching episodes and write down what she was doing prior to developing them so that we can possibly find a few common causes.  Also discussed with her that anxiety could be playing a role, to which she agreed.  Will recommend Zyrtec 10 mg daily for now, and we can possibly try Atarax in the future to help with the anxiety component.    Maia Breslow, M.D.  10/02/2018, 8:34 AM PGY-3, [facility];

## 2018-10-02 ENCOUNTER — Ambulatory Visit
Admission: RE | Admit: 2018-10-02 | Discharge: 2018-10-02 | Disposition: A | Discharge status: Home / Self Care | Payer: Medicaid Other | Source: Ambulatory Visit | Attending: Family Medicine | Admitting: Family Medicine

## 2018-10-02 DIAGNOSIS — N939 Abnormal uterine and vaginal bleeding, unspecified: Secondary | ICD-10-CM

## 2018-10-02 DIAGNOSIS — L299 Pruritus, unspecified: Secondary | ICD-10-CM | POA: Insufficient documentation

## 2018-10-02 DIAGNOSIS — D259 Leiomyoma of uterus, unspecified: Secondary | ICD-10-CM | POA: Diagnosis not present

## 2018-10-02 NOTE — Assessment & Plan Note (Signed)
Asked patient to record her itching episodes and write down what she was doing prior to developing them so that we can possibly find a few common causes.  Also discussed with her that anxiety could be playing a role, to which she agreed.  Will recommend Zyrtec 10 mg daily for now, and we can possibly try Atarax in the future to help with the anxiety component.

## 2018-11-22 ENCOUNTER — Other Ambulatory Visit: Payer: Self-pay | Admitting: Family Medicine

## 2019-01-14 ENCOUNTER — Other Ambulatory Visit: Payer: Self-pay | Admitting: *Deleted

## 2019-01-15 ENCOUNTER — Telehealth: Payer: Self-pay | Admitting: Family Medicine

## 2019-01-15 MED ORDER — FLUCONAZOLE 150 MG PO TABS: ORAL_TABLET | ORAL | 0 refills | Status: DC

## 2019-01-15 NOTE — Telephone Encounter (Signed)
Please let the patient know that I will send in fluconazole for possible yeast infection, but if her symptoms persist, I would like to have her come in for testing.  I prefer not to send in an antibiotic without knowing if she has an infection.  Thanks.

## 2019-01-18 ENCOUNTER — Telehealth: Payer: Self-pay | Admitting: *Deleted

## 2019-01-18 NOTE — Telephone Encounter (Signed)
I would like to hold off on sending this antibiotic until patient can be tested.  Thanks.

## 2019-01-18 NOTE — Telephone Encounter (Signed)
Received fax requesting refill on metronidazole 500 mg tablet. Did not see on current med list. Chinyere Galiano Katharina Caper, CMA

## 2019-01-18 NOTE — Telephone Encounter (Signed)
LVM for pt to call office to inform her of below.Orvis Stann Zimmerman Rumple, CMA  

## 2019-01-21 NOTE — Telephone Encounter (Signed)
Contacted pt and scheduled appt on ATC for Wednesday.Chelsea Henson, CMA

## 2019-01-23 ENCOUNTER — Other Ambulatory Visit: Payer: Self-pay

## 2019-01-23 ENCOUNTER — Telehealth (INDEPENDENT_AMBULATORY_CARE_PROVIDER_SITE_OTHER): Payer: Medicaid Other | Admitting: Student in an Organized Health Care Education/Training Program

## 2019-01-23 DIAGNOSIS — N898 Other specified noninflammatory disorders of vagina: Secondary | ICD-10-CM

## 2019-01-23 DIAGNOSIS — N760 Acute vaginitis: Secondary | ICD-10-CM | POA: Insufficient documentation

## 2019-01-23 DIAGNOSIS — H43393 Other vitreous opacities, bilateral: Secondary | ICD-10-CM | POA: Diagnosis not present

## 2019-01-23 HISTORY — DX: Acute vaginitis: N76.0

## 2019-01-23 MED ORDER — METRONIDAZOLE 0.75 % VA GEL: 1.0000 | Freq: Two times a day (BID) | VAGINAL | 0 refills | Status: AC

## 2019-01-23 NOTE — Progress Notes (Signed)
[facility] Telemedicine Visit  Patient consented to have virtual visit. Method of visit: Video was attempted, but technology challenges prevented patient from using video, so visit was conducted via telephone.  Attempted to video call patient but patient only has home phone.  Encounter participants: Patient: Chelsea Henson - located at home Provider: Richarda Osmond - located at [facility] Others (if applicable): none  Chief Complaint: vaginal discharge  HPI: Was recently treated for yeast infection which improved symptoms temporarily and has had change in vaginal discharge character. Continues to have moist vaginal environment which is constantly draining with a slight odor described as sterile or fishy smell which is similar to previous BV infections.  Avoids scented soaps and only uses certain toilet paper, does not douche.  October last sexual encounter. No concerns for STDs, no period for 5 months. Has h/o fibroids. Denies dysuria/frequency/urgency, vaginal lesions, bleeding, cramping. Patient is just fed up with having frequent infections and would like to be seen by a specialist. Requests a referral for [facility].   ROS: per HPI  Pertinent PMHx: borderline diabetes, frequent yeast infections and BV.  Exam:  Respiratory: no conversational dyspnea.   Assessment/Plan:  Vaginal discharge Frequently occurring and similar to past infections. Recently treated for yeast and PCP preferred to not treat with Abx without testing, however, patient was screened from in-person appointment today for sore throat. Prescribed metrogel and referred to gyn as requested.  Discussed preventative measures for recurrent infections and patient would like to try boric acid.     Time spent during visit with patient: 13 minutes

## 2019-01-23 NOTE — Assessment & Plan Note (Signed)
Frequently occurring and similar to past infections. Recently treated for yeast and PCP preferred to not treat with Abx without testing, however, patient was screened from in-person appointment today for sore throat. Prescribed metrogel and referred to gyn as requested.  Discussed preventative measures for recurrent infections and patient would like to try boric acid.

## 2019-01-28 ENCOUNTER — Telehealth: Payer: Self-pay | Admitting: Radiology

## 2019-01-28 NOTE — Telephone Encounter (Signed)
Left message to call [facility] to schedule appointment from referral

## 2019-02-21 ENCOUNTER — Ambulatory Visit (INDEPENDENT_AMBULATORY_CARE_PROVIDER_SITE_OTHER): Payer: Medicaid Other | Admitting: Obstetrics & Gynecology

## 2019-02-21 ENCOUNTER — Other Ambulatory Visit: Payer: Self-pay

## 2019-02-21 ENCOUNTER — Other Ambulatory Visit (HOSPITAL_COMMUNITY)
Admission: RE | Admit: 2019-02-21 | Discharge: 2019-02-21 | Disposition: A | Discharge status: Home / Self Care | Payer: Medicaid Other | Source: Ambulatory Visit | Attending: Obstetrics & Gynecology | Admitting: Obstetrics & Gynecology

## 2019-02-21 ENCOUNTER — Encounter: Payer: Self-pay | Admitting: Obstetrics & Gynecology

## 2019-02-21 VITALS — BP 116/78 | HR 72 | Wt 237.1 lb

## 2019-02-21 DIAGNOSIS — Z01419 Encounter for gynecological examination (general) (routine) without abnormal findings: Secondary | ICD-10-CM

## 2019-02-21 DIAGNOSIS — Z0000 Encounter for general adult medical examination without abnormal findings: Secondary | ICD-10-CM | POA: Diagnosis not present

## 2019-02-21 DIAGNOSIS — D219 Benign neoplasm of connective and other soft tissue, unspecified: Secondary | ICD-10-CM | POA: Insufficient documentation

## 2019-02-21 DIAGNOSIS — N760 Acute vaginitis: Secondary | ICD-10-CM | POA: Insufficient documentation

## 2019-02-21 DIAGNOSIS — B9689 Other specified bacterial agents as the cause of diseases classified elsewhere: Secondary | ICD-10-CM

## 2019-02-21 DIAGNOSIS — N951 Menopausal and female climacteric states: Secondary | ICD-10-CM

## 2019-02-21 MED ORDER — GABAPENTIN 600 MG PO TABS: 600.0000 mg | ORAL_TABLET | Freq: Every day | ORAL | 3 refills | Status: DC

## 2019-02-21 MED ORDER — BORIC ACID CRYS: 600.0000 mg | CRYSTALS | Freq: Every day | 5 refills | Status: DC

## 2019-02-21 NOTE — Progress Notes (Signed)
GYNECOLOGY ANNUAL PREVENTATIVE CARE ENCOUNTER NOTE  History:     Chelsea Henson is a 44 y.o. female here for a routine annual gynecologic exam.  Current complaints: recurrent vaginitis episodes for ten years.  Observes proper vulvar hygiene but feels that her vagina is very sensitive.  Multiple courses of antifungal and antibiotics, but has not tried boric acid therapy.  Also reports bothersome hot flashes and night sweats, accompanied by mood swings. Also some abnormal hair growth under chin and chest.  No period in 6 months, was skipping periods prior to this. Feels she is about to go through menopause, other female members of her family went through same symptoms.   Denies abnormal vaginal bleeding, pelvic pain, problems with intercourse or other gynecologic concerns.    Gynecologic History No LMP recorded (lmp unknown). Patient is premenopausal. Contraception: tubal ligation Last Pap: 2019?[facility] Results were: normal Last mammogram: 2019. Results were: normal  Obstetric History OB History  No obstetric history on file.    Past Medical History:  Diagnosis Date  . Abnormal uterine bleeding   . Bacterial vaginosis   . Enlarged thyroid   . Prediabetes   . Tobacco abuse     Past Surgical History:  Procedure Laterality Date  . CESAREAN SECTION    . TUBAL LIGATION      Current Outpatient Medications on File Prior to Visit  Medication Sig Dispense Refill  . cetirizine (ZYRTEC) 10 MG tablet Take 1 tablet (10 mg total) by mouth daily. (Patient not taking: Reported on 02/21/2019) 30 tablet 1  . fluconazole (DIFLUCAN) 150 MG tablet These take first tablet on day 1 then the second tablet in 72 hours if symptoms persist. (Patient not taking: Reported on 02/21/2019) 2 tablet 0  . naproxen (NAPROSYN) 500 MG tablet Take 1 tablet (500 mg total) by mouth 2 (two) times daily. (Patient not taking: Reported on 02/21/2019) 30 tablet 0  . tiZANidine (ZANAFLEX) 4 MG tablet Take 1-2 tablets (4-8  mg total) by mouth every 6 (six) hours as needed for muscle spasms. (Patient not taking: Reported on 02/21/2019) 21 tablet 0   No current facility-administered medications on file prior to visit.    No Known Allergies  Social History:  reports that she has been smoking. She uses smokeless tobacco. She reports current alcohol use. She reports that she does not use drugs.  Family History  Problem Relation Age of Onset  . Hypertension Mother   . Diabetes Sister   . Depression Paternal Grandmother     The following portions of the patient's history were reviewed and updated as appropriate: allergies, current medications, past family history, past medical history, past social history, past surgical history and problem list.  Review of Systems Pertinent items noted in HPI and remainder of comprehensive ROS otherwise negative.  Physical Exam:  BP 116/78   Pulse 72   Wt 237 lb 1.6 oz (107.5 kg)   LMP  (LMP Unknown)   BMI 38.27 kg/m  CONSTITUTIONAL: Well-developed, well-nourished female in no acute distress.  HENT:  Normocephalic, atraumatic, External right and left ear normal. Oropharynx is clear and moist EYES: Conjunctivae and EOM are normal. Pupils are equal, round, and reactive to light. No scleral icterus.  NECK: Normal range of motion, supple, no masses.  Normal thyroid.  SKIN: Skin is warm and dry. No rash noted. Not diaphoretic. No erythema. No pallor. MUSCULOSKELETAL: Normal range of motion. No tenderness.  No cyanosis, clubbing, or edema.  2+ distal pulses.  NEUROLOGIC: Alert and oriented to person, place, and time. Normal reflexes, muscle tone coordination.  PSYCHIATRIC: Normal mood and affect. Normal behavior. Normal judgment and thought content. CARDIOVASCULAR: Normal heart rate noted, regular rhythm RESPIRATORY: Clear to auscultation bilaterally. Effort and breath sounds normal, no problems with respiration noted. BREASTS: Symmetric in size. No masses, tenderness, skin  changes, nipple drainage, or lymphadenopathy bilaterally. ABDOMEN: Soft, no distention noted.  No tenderness, rebound or guarding.  PELVIC: Normal appearing external genitalia and urethral meatus; normal appearing vaginal mucosa and cervix.  Clear discharge noted, testing sample obtained.  Pap smear obtained.  Unable to palpate uterus fully secondary to habitus, but no abnormalities noted.  No other palpable masses, no uterine or adnexal tenderness.   Assessment and Plan:    1. Recurrent vaginitis Proper vulvar hygiene re-emphasized.  Will check discharge analysis today, but had a long discussion about re-establishing proper vaginal pH balance.  Boric acid therapy prescribed, will reevaluate in one month. - Cervicovaginal ancillary only( [facility]) - Boric Acid CRYS; Place 600 mg vaginally at bedtime. Use vaginally every night for two weeks then twice a week for six months  Dispense: 500 g; Refill: 5  2. Perimenopausal vasomotor symptoms Patient with bothersome menopausal vasomotor symptoms. Discussed lifestyle interventions such as wearing light clothing, remaining in cool environments, having fan/air conditioner in the room, avoiding hot beverages etc.  Discussed using hormone therapy and concerns about increased risk of heart disease, cerebrovascular disease, thromboembolic disease,  andcancer.  Also discussed other medical options such as antidepressants or Neurontin.   Also discussed alternative therapies such as herbal remedies but cautioned that most of the products contained phytoestrogens (plant estrogens) in unregulated amounts which can have the same effects on the body as the pharmaceutical estrogen preparations. She wanted Neurontin for now, considering hormone therapy too given. - gabapentin (NEURONTIN) 600 MG tablet; Take 1 tablet (600 mg total) by mouth at bedtime.  Dispense: 30 tablet; Refill: 3  3. Well woman exam with routine gynecological exam - Cytology+HPV test - PAP ([facility]) Will follow up results of pap smear and manage accordingly. Mammogram is up to date. Routine preventative health maintenance measures emphasized. Please refer to After Visit Summary for other counseling recommendations.      Verita Schneiders, MD, [place] for [employer], [place]

## 2019-02-21 NOTE — Patient Instructions (Signed)
Vaginitis Vaginitis is a condition in which the vaginal tissue swells and becomes red (inflamed). This condition is most often caused by a change in the normal balance of bacteria and yeast that live in the vagina. This change causes an overgrowth of certain bacteria or yeast, which causes the inflammation. There are different types of vaginitis, but the most common types are:  Bacterial vaginosis.  Yeast infection (candidiasis).  Trichomoniasis vaginitis. This is a sexually transmitted disease (STD).  Viral vaginitis.  Atrophic vaginitis.  Allergic vaginitis. What are the causes? The cause of this condition depends on the type of vaginitis. It can be caused by:  Bacteria (bacterial vaginosis).  Yeast, which is a fungus (yeast infection).  A parasite (trichomoniasis vaginitis).  A virus (viral vaginitis).  Low hormone levels (atrophic vaginitis). Low hormone levels can occur during pregnancy, breastfeeding, or after menopause.  Irritants, such as bubble baths, scented tampons, and feminine sprays (allergic vaginitis). Other factors can change the normal balance of the yeast and bacteria that live in the vagina. These include:  Antibiotic medicines.  Poor hygiene.  Diaphragms, vaginal sponges, spermicides, birth control pills, and intrauterine devices (IUD).  Sex.  Infection.  Uncontrolled diabetes.  A weakened defense (immune) system. What increases the risk? This condition is more likely to develop in women who:  Smoke.  Use vaginal douches, scented tampons, or scented sanitary pads.  Wear tight-fitting pants.  Wear thong underwear.  Use oral birth control pills or an IUD.  Have sex without a condom.  Have multiple sex partners.  Have an STD.  Frequently use the spermicide nonoxynol-9.  Eat lots of foods high in sugar.  Have uncontrolled diabetes.  Have low estrogen levels.  Have a weakened immune system from an immune disorder or medical  treatment.  Are pregnant or breastfeeding. What are the signs or symptoms? Symptoms vary depending on the cause of the vaginitis. Common symptoms include:  Abnormal vaginal discharge. ? The discharge is white, gray, or yellow with bacterial vaginosis. ? The discharge is thick, white, and cheesy with a yeast infection. ? The discharge is frothy and yellow or greenish with trichomoniasis.  A bad vaginal smell. The smell is fishy with bacterial vaginosis.  Vaginal itching, pain, or swelling.  Sex that is painful.  Pain or burning when urinating. Sometimes there are no symptoms. How is this diagnosed? This condition is diagnosed based on your symptoms and medical history. A physical exam, including a pelvic exam, will also be done. You may also have other tests, including:  Tests to determine the pH level (acidity or alkalinity) of your vagina.  A whiff test, to assess the odor that results when a sample of your vaginal discharge is mixed with a potassium hydroxide solution.  Tests of vaginal fluid. A sample will be examined under a microscope. How is this treated? Treatment varies depending on the type of vaginitis you have. Your treatment may include:  Antibiotic creams or pills to treat bacterial vaginosis and trichomoniasis.  Antifungal medicines, such as vaginal creams or suppositories, to treat a yeast infection.  Medicine to ease discomfort if you have viral vaginitis. Your sexual partner should also be treated.  Estrogen delivered in a cream, pill, suppository, or vaginal ring to treat atrophic vaginitis. If vaginal dryness occurs, lubricants and moisturizing creams may help. You may need to avoid scented soaps, sprays, or douches.  Stopping use of a product that is causing allergic vaginitis. Then using a vaginal cream to treat the symptoms. Follow   these instructions at home: Lifestyle  Keep your genital area clean and dry. Avoid soap, and only rinse the area with  water.  Do not douche or use tampons until your health care provider says it is okay to do so. Use sanitary pads, if needed.  Do not have sex until your health care provider approves. When you can return to sex, practice safe sex and use condoms.  Wipe from front to back. This avoids the spread of bacteria from the rectum to the vagina. General instructions  Take over-the-counter and prescription medicines only as told by your health care provider.  If you were prescribed an antibiotic medicine, take or use it as told by your health care provider. Do not stop taking or using the antibiotic even if you start to feel better.  Keep all follow-up visits as told by your health care provider. This is important. How is this prevented?  Use mild, non-scented products. Do not use things that can irritate the vagina, such as fabric softeners. Avoid the following products if they are scented: ? Feminine sprays. ? Detergents. ? Tampons. ? Feminine hygiene products. ? Soaps or bubble baths.  Let air reach your genital area. ? Wear cotton underwear to reduce moisture buildup. ? Avoid wearing underwear while you sleep. ? Avoid wearing tight pants and underwear or nylons without a cotton panel. ? Avoid wearing thong underwear.  Take off any wet clothing, such as bathing suits, as soon as possible.  Practice safe sex and use condoms. Contact a health care provider if:  You have abdominal pain.  You have a fever.  You have symptoms that last for more than 2-3 days. Get help right away if:  You have a fever and your symptoms suddenly get worse. Summary  Vaginitis is a condition in which the vaginal tissue becomes inflamed.This condition is most often caused by a change in the normal balance of bacteria and yeast that live in the vagina.  Treatment varies depending on the type of vaginitis you have.  Do not douche, use tampons , or have sex until your health care provider approves. When  you can return to sex, practice safe sex and use condoms. This information is not intended to replace advice given to you by your health care provider. Make sure you discuss any questions you have with your health care provider. Document Revised: 01/06/2017 Document Reviewed: 03/01/2016 Elsevier Patient Education  2020 [place] is the normal time of life before and after menstrual periods stop completely (menopause). Perimenopause can begin 2-8 years before menopause, and it usually lasts for 1 year after menopause. During perimenopause, the ovaries may or may not produce an egg. What are the causes? This condition is caused by a natural change in hormone levels that happens as you get older. What increases the risk? This condition is more likely to start at an earlier age if you have certain medical conditions or treatments, including:  A tumor of the pituitary gland in the brain.  A disease that affects the ovaries and hormone production.  Radiation treatment for cancer.  Certain cancer treatments, such as chemotherapy or hormone (anti-estrogen) therapy.  Heavy smoking and excessive alcohol use.  Family history of early menopause. What are the signs or symptoms? Perimenopausal changes affect each woman differently. Symptoms of this condition may include:  Hot flashes.  Night sweats.  Irregular menstrual periods.  Decreased sex drive.  Vaginal dryness.  Headaches.  Mood swings.  Depression.  Memory problems or trouble concentrating.  Irritability.  Tiredness.  Weight gain.  Anxiety.  Trouble getting pregnant. How is this diagnosed? This condition is diagnosed based on your medical history, a physical exam, your age, your menstrual history, and your symptoms. Hormone tests may also be done. How is this treated? In some cases, no treatment is needed. You and your health care provider should make a decision together about  whether treatment is necessary. Treatment will be based on your individual condition and preferences. Various treatments are available, such as:  Menopausal hormone therapy (MHT).  Medicines to treat specific symptoms.  Acupuncture.  Vitamin or herbal supplements. Before starting treatment, make sure to let your health care provider know if you have a personal or family history of:  Heart disease.  Breast cancer.  Blood clots.  Diabetes.  Osteoporosis. Follow these instructions at home: Lifestyle  Do not use any products that contain nicotine or tobacco, such as cigarettes and e-cigarettes. If you need help quitting, ask your health care provider.  Eat a balanced diet that includes fresh fruits and vegetables, whole grains, soybeans, eggs, lean meat, and low-fat dairy.  Get at least 30 minutes of physical activity on 5 or more days each week.  Avoid alcoholic and caffeinated beverages, as well as spicy foods. This may help prevent hot flashes.  Get 7-8 hours of sleep each night.  Dress in layers that can be removed to help you manage hot flashes.  Find ways to manage stress, such as deep breathing, meditation, or journaling. General instructions  Keep track of your menstrual periods, including: ? When they occur. ? How heavy they are and how long they last. ? How much time passes between periods.  Keep track of your symptoms, noting when they start, how often you have them, and how long they last.  Take over-the-counter and prescription medicines only as told by your health care provider.  Take vitamin supplements only as told by your health care provider. These may include calcium, vitamin E, and vitamin D.  Use vaginal lubricants or moisturizers to help with vaginal dryness and improve comfort during sex.  Talk with your health care provider before starting any herbal supplements.  Keep all follow-up visits as told by your health care provider. This is  important. This includes any group therapy or counseling. Contact a health care provider if:  You have heavy vaginal bleeding or pass blood clots.  Your period lasts more than 2 days longer than normal.  Your periods are recurring sooner than 21 days.  You bleed after having sex. Get help right away if:  You have chest pain, trouble breathing, or trouble talking.  You have severe depression.  You have pain when you urinate.  You have severe headaches.  You have vision problems. Summary  Perimenopause is the time when a woman's body begins to move into menopause. This may happen naturally or as a result of other health problems or medical treatments.  Perimenopause can begin 2-8 years before menopause, and it usually lasts for 1 year after menopause.  Perimenopausal symptoms can be managed through medicines, lifestyle changes, and complementary therapies such as acupuncture. This information is not intended to replace advice given to you by your health care provider. Make sure you discuss any questions you have with your health care provider. Document Revised: 01/06/2017 Document Reviewed: 03/01/2016 Elsevier Patient Education  2020 [employer].

## 2019-02-22 LAB — CERVICOVAGINAL ANCILLARY ONLY
Bacterial Vaginitis (gardnerella): POSITIVE — AB
Candida Glabrata: NEGATIVE
Candida Vaginitis: NEGATIVE
Chlamydia: NEGATIVE
Comment: NEGATIVE
Comment: NEGATIVE
Comment: NEGATIVE
Comment: NEGATIVE
Comment: NEGATIVE
Comment: NORMAL
Neisseria Gonorrhea: NEGATIVE
Trichomonas: NEGATIVE

## 2019-02-22 LAB — CYTOLOGY - PAP
Comment: NEGATIVE
Diagnosis: NEGATIVE
High risk HPV: NEGATIVE

## 2019-02-26 MED ORDER — METRONIDAZOLE 500 MG PO TABS: 500.0000 mg | ORAL_TABLET | Freq: Two times a day (BID) | ORAL | 0 refills | Status: DC

## 2019-02-26 NOTE — Addendum Note (Signed)
Addended by: Verita Schneiders A on: 02/26/2019 08:57 AM   Modules accepted: Orders

## 2019-03-13 DIAGNOSIS — H04123 Dry eye syndrome of bilateral lacrimal glands: Secondary | ICD-10-CM | POA: Diagnosis not present

## 2019-03-26 ENCOUNTER — Telehealth: Payer: Self-pay | Admitting: Radiology

## 2019-03-26 ENCOUNTER — Telehealth: Payer: Medicaid Other | Admitting: Obstetrics & Gynecology

## 2019-03-26 NOTE — Progress Notes (Deleted)
   Patient did not show up today for her scheduled appointment.   Jabir Dahlem, MD, FACOG Obstetrician & Gynecologist, [facility], [facility]  

## 2019-03-26 NOTE — Telephone Encounter (Signed)
Called patient to start virtual visit with Dr Harolyn Rutherford, patient did not answer the phone, voicemail left for patient to call [id number].

## 2019-04-10 ENCOUNTER — Telehealth: Payer: Self-pay

## 2019-04-10 NOTE — Telephone Encounter (Signed)
Received call from patient, she reports starting her period yesterday after several months of not having one. She is concern with the amount of bleeding she is having  at this time. She reports soaking 2 overnight pads every 20 minutes and is requesting a megace be called into her pharmacy. I reach out to [facility] and she has advised patient to go to ED to be check out and to schedule a follow up with our office once she has been seen.

## 2019-04-15 ENCOUNTER — Other Ambulatory Visit: Payer: Self-pay

## 2019-04-15 ENCOUNTER — Emergency Department (HOSPITAL_COMMUNITY)
Admission: EM | Admit: 2019-04-15 | Discharge: 2019-04-15 | Disposition: A | Discharge status: Home / Self Care | Payer: Medicaid Other | Attending: Emergency Medicine | Admitting: Emergency Medicine

## 2019-04-15 DIAGNOSIS — F17228 Nicotine dependence, chewing tobacco, with other nicotine-induced disorders: Secondary | ICD-10-CM | POA: Insufficient documentation

## 2019-04-15 DIAGNOSIS — R42 Dizziness and giddiness: Secondary | ICD-10-CM | POA: Diagnosis not present

## 2019-04-15 DIAGNOSIS — N939 Abnormal uterine and vaginal bleeding, unspecified: Secondary | ICD-10-CM | POA: Insufficient documentation

## 2019-04-15 DIAGNOSIS — F17200 Nicotine dependence, unspecified, uncomplicated: Secondary | ICD-10-CM | POA: Diagnosis not present

## 2019-04-15 LAB — COMPREHENSIVE METABOLIC PANEL
ALT: 20 U/L (ref 0–44)
AST: 20 U/L (ref 15–41)
Albumin: 3.7 g/dL (ref 3.5–5.0)
Alkaline Phosphatase: 63 U/L (ref 38–126)
Anion gap: 4 — ABNORMAL LOW (ref 5–15)
BUN: 9 mg/dL (ref 6–20)
CO2: 27 mmol/L (ref 22–32)
Calcium: 8.6 mg/dL — ABNORMAL LOW (ref 8.9–10.3)
Chloride: 107 mmol/L (ref 98–111)
Creatinine, Ser: 0.59 mg/dL (ref 0.44–1.00)
GFR calc Af Amer: >60 mL/min (ref >60)
GFR calc non Af Amer: >60 mL/min (ref >60)
Glucose, Bld: 95 mg/dL (ref 70–99)
Potassium: 3.9 mmol/L (ref 3.5–5.1)
Sodium: 138 mmol/L (ref 135–145)
Total Bilirubin: 0.7 mg/dL (ref 0.3–1.2)
Total Protein: 6.5 g/dL (ref 6.5–8.1)

## 2019-04-15 LAB — CBC
HCT: 32.4 % — ABNORMAL LOW (ref 36.0–46.0)
Hemoglobin: 10.1 g/dL — ABNORMAL LOW (ref 12.0–15.0)
MCH: 28.1 pg (ref 26.0–34.0)
MCHC: 31.2 g/dL (ref 30.0–36.0)
MCV: 90 fL (ref 80.0–100.0)
Platelets: 268 10*3/uL (ref 150–400)
RBC: 3.6 MIL/uL — ABNORMAL LOW (ref 3.87–5.11)
RDW: 16 % — ABNORMAL HIGH (ref 11.5–15.5)
WBC: 5.3 10*3/uL (ref 4.0–10.5)
nRBC: 0 % (ref 0.0–0.2)

## 2019-04-15 LAB — I-STAT BETA HCG BLOOD, ED (MC, WL, AP ONLY): I-stat hCG, quantitative: <5 m[IU]/mL (ref <5)

## 2019-04-15 LAB — TYPE AND SCREEN
ABO/RH(D): O POS
Antibody Screen: NEGATIVE

## 2019-04-15 LAB — ABO/RH: ABO/RH(D): O POS

## 2019-04-15 MED ORDER — MEGESTROL ACETATE 40 MG PO TABS
40.0000 mg | ORAL_TABLET | Freq: Every day | ORAL | Status: DC
  Administered 2019-04-15: 40 mg via ORAL
  Filled 2019-04-15: qty 1

## 2019-04-15 MED ORDER — MEGESTROL ACETATE 40 MG PO TABS: 40.0000 mg | ORAL_TABLET | Freq: Every day | ORAL | 0 refills | Status: AC

## 2019-04-15 NOTE — ED Triage Notes (Signed)
Pt here for evaluation of heavy vaginal bleeding x 2 weeks. Followed by gyn for same-has not had period in 7 months d/t being premenopausal, but bleeding has returned. Hx fibroids as well. Passed clot larger than a golf ball this morning, feeling lightheaded, dizzy, and fatigued.

## 2019-04-15 NOTE — ED Provider Notes (Signed)
[facility] EMERGENCY DEPARTMENT Provider Note   CSN: [id number] Arrival date & time: 04/15/19  1248     History Chief Complaint  Patient presents with  . Vaginal Bleeding    Chelsea Henson is a 44 y.o. female with a history of uterine fibroids and heavy menstrual bleeding presenting to the ED with vaginal bleeding and passing clots.  Patient reports that she has not had a menstrual period in 7 months.  She was told that she was perimenopausal.  However beginning in mid-February of this year, she began having vaginal bleeding.  This occurs on a near daily basis.  Some days are heavier than others.  She reports passing clots frequently.  Today she was at work and pass a very sizable blood clot and became concerned.  She does at times feel lightheaded.  She does not feel short of breath.  She does not currently have any lightheadedness, shortness of breath or chest pain.  She has never needed a blood transfusion in the past.  [facility] and does have an OB/GYN doctor.  Her next appointment scheduled April 1.  Her doctor had offered her Megace during her last conversation, but she had declined the treatment.  She did have a pelvic U/S in late 2020 which showed fibroids and leiomyomas of the uterus  She reports a "normal" pap smear in the past year.   HPI     Past Medical History:  Diagnosis Date  . Abnormal uterine bleeding   . Bacterial vaginosis   . Enlarged thyroid   . Prediabetes   . Tobacco abuse     Patient Active Problem List   Diagnosis Date Noted  . Fibroids 02/21/2019  . Recurrent vaginitis 01/23/2019  . Hirsutism 09/20/2018  . Abnormal uterine bleeding   . Tobacco abuse   . Prediabetes   . Enlarged thyroid   . Breast lump     Past Surgical History:  Procedure Laterality Date  . CESAREAN SECTION    . TUBAL LIGATION       OB History   No obstetric history on file.     Family History  Problem  Relation Age of Onset  . Hypertension Mother   . Diabetes Sister   . Depression Paternal Grandmother     Social History   Tobacco Use  . Smoking status: Current Some Day Smoker  . Smokeless tobacco: Current User  Substance Use Topics  . Alcohol use: Yes  . Drug use: Never    Home Medications Prior to Admission medications   Medication Sig Start Date End Date Taking? Authorizing Provider  Boric Acid [facility] [facility] 600 mg vaginally at bedtime. Use vaginally every night for two weeks then twice a week for six months 02/21/19   Anyanwu, Sallyanne Havers, MD  cetirizine (ZYRTEC) 10 MG tablet Take 1 tablet (10 mg total) by mouth daily. Patient not taking: Reported on 02/21/2019 10/01/18   Kathrene Alu, MD  fluconazole (DIFLUCAN) 150 MG tablet These take first tablet on day 1 then the second tablet in 72 hours if symptoms persist. Patient not taking: Reported on 02/21/2019 01/15/19   Kathrene Alu, MD  gabapentin (NEURONTIN) 600 MG tablet Take 1 tablet (600 mg total) by mouth at bedtime. 02/21/19   Anyanwu, Sallyanne Havers, MD  megestrol (MEGACE) 40 MG tablet Take 1 tablet (40 mg total) by mouth daily for 14 days. 04/16/19 04/30/19  Wyvonnia Dusky, MD  metroNIDAZOLE (FLAGYL) 500  MG tablet Take 1 tablet (500 mg total) by mouth 2 (two) times daily. 02/26/19   Anyanwu, Sallyanne Havers, MD  naproxen (NAPROSYN) 500 MG tablet Take 1 tablet (500 mg total) by mouth 2 (two) times daily. Patient not taking: Reported on 02/21/2019 08/31/18   Raylene Everts, MD  tiZANidine (ZANAFLEX) 4 MG tablet Take 1-2 tablets (4-8 mg total) by mouth every 6 (six) hours as needed for muscle spasms. Patient not taking: Reported on 02/21/2019 08/31/18   Raylene Everts, MD    Allergies    Patient has no known allergies.  Review of Systems   Review of Systems  Constitutional: Negative for chills and fever.  Respiratory: Negative for cough and shortness of breath.   Cardiovascular: Negative for chest pain and palpitations.    Gastrointestinal: Negative for abdominal pain, nausea and vomiting.  Genitourinary: Positive for menstrual problem and vaginal bleeding. Negative for dysuria, hematuria, vaginal discharge and vaginal pain.  Musculoskeletal: Negative for arthralgias and back pain.  Skin: Negative for color change and rash.  Neurological: Positive for light-headedness. Negative for syncope.  All other systems reviewed and are negative.   Physical Exam Updated Vital Signs BP 125/71 (BP Location: Right Arm)   Pulse (!) 54   Temp 98.4 F (36.9 C) (Oral)   Resp 18   SpO2 100%   Physical Exam Vitals and nursing note reviewed.  Constitutional:      General: She is not in acute distress.    Appearance: She is well-developed.  HENT:     Head: Normocephalic and atraumatic.  Eyes:     Conjunctiva/sclera: Conjunctivae normal.  Cardiovascular:     Rate and Rhythm: Normal rate and regular rhythm.     Pulses: Normal pulses.     Heart sounds: No murmur.  Pulmonary:     Effort: Pulmonary effort is normal. No respiratory distress.     Breath sounds: Normal breath sounds.  Abdominal:     General: There is no distension.     Palpations: Abdomen is soft.     Tenderness: There is no abdominal tenderness. There is no guarding.  Musculoskeletal:     Cervical back: Neck supple.  Skin:    General: Skin is warm and dry.  Neurological:     General: No focal deficit present.     Mental Status: She is alert and oriented to person, place, and time.  Psychiatric:        Mood and Affect: Mood normal.        Behavior: Behavior normal.     ED Results / Procedures / Treatments   Labs (all labs ordered are listed, but only abnormal results are displayed) Labs Reviewed  COMPREHENSIVE METABOLIC PANEL - Abnormal; Notable for the following components:      Result Value   Calcium 8.6 (*)    Anion gap 4 (*)    All other components within normal limits  CBC - Abnormal; Notable for the following components:   RBC  3.60 (*)    Hemoglobin 10.1 (*)    HCT 32.4 (*)    RDW 16.0 (*)    All other components within normal limits  I-STAT BETA HCG BLOOD, ED ([facility], [facility], [facility] ONLY)  TYPE AND SCREEN  ABO/RH    EKG None  Radiology No results found.  Procedures Procedures (including critical care time)  Medications Ordered in ED Medications  megestrol (MEGACE) tablet 40 mg (40 mg Oral Given 04/15/19 1556)    ED Course  I  have reviewed the triage vital signs and the nursing notes.  Pertinent labs & imaging results that were available during my care of the patient were reviewed by me and considered in my medical decision making (see chart for details).  This is a pleasant 44 yo female presenting with suspected abnormal uterine bleeding for nearly a month, after a period of 7 months without menstrual bleeding.  She is quite upset to see this happening, and she tells me she was dealing with this situation in the past, and felt like she was "done with it all" for the past few months.  She follows with Dr Harolyn Rutherford and appears to be in discussions about initiating hormone therapy (eg. Megace) or surgical intervention for her uterine fibroids, confirmed on 2020 pelvic ultrasound.  I am strongly suspicious of these fibroids as the source of her bleeding, particularly with her passing large clots.  Her pregnancy is negative Her hgb is > 10 here.  With nearly 1 month of symptoms, I doubt that she is losing a significant amount of blood, and I don't feel she needs a transfusion at this time.  Her reports a normal pap smear recently.  We discussed further management for AUB, which will largely be managed by her OBGYN.  I did offer to start her on megace as this was also suggested by her provider.  We discussed the risk of clot formation vs the benefits of managing her bleeding, which she tells me is interfering with her life and her work.  She is amenable to trying a course of megace.    * Final Clinical Impression(s) /  ED Diagnoses Final diagnoses:  Abnormal uterine bleeding (AUB)    Rx / DC Orders ED Discharge Orders         Ordered    megestrol (MEGACE) 40 MG tablet  Daily     04/15/19 1538           Wyvonnia Dusky, MD 04/15/19 [phone]

## 2019-04-15 NOTE — Discharge Instructions (Signed)
Please follow up with your OBGYN provider's office by phone tomorrow to let them know about your ER visit today.   I prescribed you a hormone called Megace which may help with your uterine bleeding.  I prescribed you 2 weeks of this medication.  Your OBGYN doctor should guide your therapy beyond this period.

## 2019-04-24 ENCOUNTER — Other Ambulatory Visit: Payer: Self-pay

## 2019-04-24 ENCOUNTER — Ambulatory Visit (INDEPENDENT_AMBULATORY_CARE_PROVIDER_SITE_OTHER): Payer: Medicaid Other | Admitting: Family Medicine

## 2019-04-24 ENCOUNTER — Telehealth: Payer: Self-pay | Admitting: *Deleted

## 2019-04-24 VITALS — BP 120/70 | HR 65 | Ht 66.0 in | Wt 230.4 lb

## 2019-04-24 DIAGNOSIS — Z1231 Encounter for screening mammogram for malignant neoplasm of breast: Secondary | ICD-10-CM | POA: Diagnosis not present

## 2019-04-24 DIAGNOSIS — N611 Abscess of the breast and nipple: Secondary | ICD-10-CM | POA: Diagnosis not present

## 2019-04-24 NOTE — Telephone Encounter (Signed)
Left message, returning pt's call from this AM

## 2019-04-24 NOTE — Progress Notes (Signed)
    SUBJECTIVE:   CHIEF COMPLAINT: knot on breast  HPI:   Developed itching along breast this past weekend then developed what looked like a pimple just above her R nipple on Saturday which progressed with subsequent pain on Monday. She has been trying hot compresses and showers with some relief but denies active drainage. She has had to put cushion in her bra to take pressure off of it. Denies fevers, nipple discharge. Denies immediate FH of breast cancer (has non-blood related aunt with h/o breast cancer). Wants screening mammogram.   PERTINENT  PMH / PSH: KUB, tobacco use, prediabetes, fibroids, hirsutism  OBJECTIVE:   BP 120/70   Pulse 65   Ht 5\' 6"  (1.676 m)   Wt 230 lb 6 oz (104.5 kg)   SpO2 99%   BMI 37.18 kg/m   Gen: well appearing, in NAD Breasts: left breast normal without mass, skin or nipple changes or axillary nodes. 0.2cm fluctuant and indurated area just above R nipple within the areola. No other skin changes, dimpling, rash, nipple discharge, masses or axillary lymph nodes noted to R breast.   Diagnosis: abscess - Location: R breast Procedure: Incision & drainage Informed consent:  Discussed risks (bleeding, bruising, numbness, and recurrence of the condition) and benefits of the procedure, as well as the alternatives.  Informed consent was obtained. Anesthesia: local w/ 2% lidocaide w/o epi The area was prepared and draped in a standard fashion. After assessing anesthesia, a single puncture was made with an 11 blade scapel with immediate return of blood and purulent material. The wound was left open and covered with sterile dressing.  The patient tolerated the procedure well without immediate complications. The patient was instructed on post-op care.   ASSESSMENT/PLAN:   Breast abscess I&D performed without complication, see above procedure note. Post-op care instructions provided. Likely developed from bacterial introduction during local trauma from scratching  due to chronic itching. Breast exam otherwise wnl. Will order screening mammogram, instructed patient to wait at least 2 weeks prior to making appointment to allow sufficient healing. Follow up prn.    Rory Percy, [place]

## 2019-04-24 NOTE — Patient Instructions (Signed)
It was great to see you!  Our plans for today:  - We lanced your abscess today. Continue to use warm compresses as needed. You can take tylenol or ibuprofen as needed for pain. - Come back as needed. - If you develop redness, swelling, or fevers, let us know.  Take care and seek immediate care sooner if you develop any concerns.   Dr. [facility]

## 2019-04-24 NOTE — Assessment & Plan Note (Addendum)
I&D performed without complication, see above procedure note. Post-op care instructions provided. Likely developed from bacterial introduction during local trauma from scratching due to chronic itching. Breast exam otherwise wnl. Will order screening mammogram, instructed patient to wait at least 2 weeks prior to making appointment to allow sufficient healing. Follow up prn.

## 2019-05-01 ENCOUNTER — Other Ambulatory Visit: Payer: Self-pay

## 2019-05-01 ENCOUNTER — Ambulatory Visit
Admission: RE | Admit: 2019-05-01 | Discharge: 2019-05-01 | Disposition: A | Discharge status: Home / Self Care | Payer: Medicaid Other | Source: Ambulatory Visit | Attending: Family Medicine | Admitting: Family Medicine

## 2019-05-01 DIAGNOSIS — Z1231 Encounter for screening mammogram for malignant neoplasm of breast: Secondary | ICD-10-CM | POA: Diagnosis not present

## 2019-05-07 ENCOUNTER — Telehealth (INDEPENDENT_AMBULATORY_CARE_PROVIDER_SITE_OTHER): Payer: Medicaid Other | Admitting: Family Medicine

## 2019-05-07 ENCOUNTER — Other Ambulatory Visit: Payer: Self-pay

## 2019-05-07 ENCOUNTER — Other Ambulatory Visit: Payer: Medicaid Other

## 2019-05-07 DIAGNOSIS — N760 Acute vaginitis: Secondary | ICD-10-CM

## 2019-05-07 DIAGNOSIS — D219 Benign neoplasm of connective and other soft tissue, unspecified: Secondary | ICD-10-CM | POA: Diagnosis not present

## 2019-05-07 DIAGNOSIS — B9689 Other specified bacterial agents as the cause of diseases classified elsewhere: Secondary | ICD-10-CM

## 2019-05-07 DIAGNOSIS — N939 Abnormal uterine and vaginal bleeding, unspecified: Secondary | ICD-10-CM | POA: Diagnosis not present

## 2019-05-07 MED ORDER — METRONIDAZOLE 500 MG PO TABS: 500.0000 mg | ORAL_TABLET | Freq: Two times a day (BID) | ORAL | 0 refills | Status: DC

## 2019-05-07 MED ORDER — MEGESTROL ACETATE 40 MG PO TABS: 40.0000 mg | ORAL_TABLET | Freq: Every day | ORAL | 1 refills | Status: DC

## 2019-05-07 NOTE — Addendum Note (Signed)
Addended by: Danna Hefty on: 05/07/2019 10:31 AM   Modules accepted: Orders

## 2019-05-07 NOTE — Assessment & Plan Note (Addendum)
Patient returning today for very heavy menstrual bleeding that is persistent in the setting of known fibroids seen on pelvic ultrasound back in August 2020.  She has done a trial of Megace with improvement in her symptoms but her bleeding has since returned.  She also is developing symptoms concerning for anemia secondary to acute blood loss.  During virtual encounter today patient received phone call from Dr. Arther Abbott office and opted to call them back and try to get seen by them as soon as possible.  I do recommend that if she cannot be seen by Dr. Arther Abbott office today, she come in for evaluation and lab work.  Can consider another trial of Megace 40mg  BID x 14 days to help with her heavy bleeding.  Would likely benefit from long-term hormonal treatment to help with symptoms if she is going to defer definitive treatment. Given length of heavy bleeding, also recommend patient start an OTC iron supplement and be sure to stay well hydrated.

## 2019-05-07 NOTE — Telephone Encounter (Signed)
Received call from patient she is still bleeding and has did not start the megace as it was recommended by her gyn. She also thinks she has BV and would like something called into the pharmacy to clear this up as well. I have advised her to go ahead and restart the megace and continue taking it. She has a upcoming appointment on 05/09/2019. Refill on megace and flagyl

## 2019-05-07 NOTE — Progress Notes (Addendum)
[facility] Visit  Patient consented to have virtual visit. Method of visit: Video  Encounter participants: Patient: Chelsea Henson - located at home Provider: Danna Hefty - located at [facility] Others (if applicable): None  Chief Complaint: Heavy menstrual bleeding  HPI: Patient saw Dr. Shan Levans back in August 2020 and noted she had not had a period since July 2020. She was referred to Dr. Harolyn Rutherford and was found to have fibroids on pelvic ultrasound in 09/2018.  She notes she since then she has had heavy menstrual bleeding ever since 03/2019. They discussed management options including surgery vs hormonal treatment. She opted against treatment at that time and opted to just treat with vaginal boric acid to help maintain vaginal pH level. However her bleeding has persisted. She notes she went to the ED in March and was given Megace with much improvement in her symptoms. She developed a "Boil on my breast", she thought that was due to the Megace and discontinued it. Now the bleeding has restarted and is having heavy bleeding with large clots. She is also starting to have dizziness when she moves around too quickly.   Prior Imaging:  Pelvic Ultrasound: 10/02/18:  IMPRESSION: Multiple uterine leiomyomata within a mildly enlarged uterus.  CBC 04/15/19: Hgb 10.1  ROS: per HPI  Pertinent PMHx: Perimenopausal, known leiomyoma, abnormal uterine bleeding  Exam:  Respiratory: Breathing comfortably on room air, speaking in full sentences.  Assessment/Plan:  Abnormal uterine bleeding Patient returning today for very heavy menstrual bleeding that is persistent in the setting of known fibroids seen on pelvic ultrasound back in August 2020.  She has done a trial of Megace with improvement in her symptoms but her bleeding has since returned.  She also is developing symptoms concerning for anemia secondary to acute blood loss.  During virtual encounter today  patient received phone call from Dr. Arther Abbott office and opted to call them back and try to get seen by them as soon as possible.  I do recommend that if she cannot be seen by Dr. Arther Abbott office today, she come in for evaluation and lab work.  Can consider another trial of Megace 40mg  BID x 14 days to help with her heavy bleeding.  Would likely benefit from long-term hormonal treatment to help with symptoms if she is going to defer definitive treatment. Given length of heavy bleeding, also recommend patient start an OTC iron supplement and be sure to stay well hydrated.   Addendum: Patient was unable to be seen by Dr. Loni Muse until Thursday, but they have been called in Megace for patient. Patient to have CBC today to evaluate her level of anemia given extent of her heavy bleeding. As above recommended patient start iron supplement. She voiced understanding and agreement with plan.  Total time: 20 minutes. This includes time spent with the patient during the visit as well as time spent before and after the visit reviewing the chart, documenting the encounter, making phone calls, reviewing studies, etc.  Mina Marble, [place], PGY2 05/07/19

## 2019-05-08 LAB — CBC
Hematocrit: 30.3 % — ABNORMAL LOW (ref 34.0–46.6)
Hemoglobin: 9.7 g/dL — ABNORMAL LOW (ref 11.1–15.9)
MCH: 27.4 pg (ref 26.6–33.0)
MCHC: 32 g/dL (ref 31.5–35.7)
MCV: 86 fL (ref 79–97)
Platelets: 347 10*3/uL (ref 150–450)
RBC: 3.54 x10E6/uL — ABNORMAL LOW (ref 3.77–5.28)
RDW: 14.7 % (ref 11.7–15.4)
WBC: 6.4 10*3/uL (ref 3.4–10.8)

## 2019-05-09 ENCOUNTER — Ambulatory Visit (INDEPENDENT_AMBULATORY_CARE_PROVIDER_SITE_OTHER): Payer: Medicaid Other | Admitting: Obstetrics & Gynecology

## 2019-05-09 ENCOUNTER — Encounter: Payer: Self-pay | Admitting: Obstetrics & Gynecology

## 2019-05-09 ENCOUNTER — Encounter: Payer: Self-pay | Admitting: Radiology

## 2019-05-09 ENCOUNTER — Other Ambulatory Visit (HOSPITAL_COMMUNITY)
Admission: RE | Admit: 2019-05-09 | Discharge: 2019-05-09 | Disposition: A | Discharge status: Home / Self Care | Payer: Medicaid Other | Source: Ambulatory Visit | Attending: Obstetrics & Gynecology | Admitting: Obstetrics & Gynecology

## 2019-05-09 ENCOUNTER — Other Ambulatory Visit: Payer: Self-pay

## 2019-05-09 VITALS — BP 128/78 | HR 63 | Wt 225.0 lb

## 2019-05-09 DIAGNOSIS — D219 Benign neoplasm of connective and other soft tissue, unspecified: Secondary | ICD-10-CM | POA: Diagnosis not present

## 2019-05-09 DIAGNOSIS — N939 Abnormal uterine and vaginal bleeding, unspecified: Secondary | ICD-10-CM

## 2019-05-09 DIAGNOSIS — D500 Iron deficiency anemia secondary to blood loss (chronic): Secondary | ICD-10-CM | POA: Diagnosis not present

## 2019-05-09 MED ORDER — MEGESTROL ACETATE 40 MG PO TABS: 80.0000 mg | ORAL_TABLET | Freq: Every day | ORAL | 5 refills | Status: DC

## 2019-05-09 MED ORDER — ASCORBIC ACID 500 MG PO TABS: 500.0000 mg | ORAL_TABLET | Freq: Every day | ORAL | 2 refills | Status: DC

## 2019-05-09 MED ORDER — FERROUS SULFATE 325 (65 FE) MG PO TABS: 325.0000 mg | ORAL_TABLET | Freq: Two times a day (BID) | ORAL | 3 refills | Status: DC

## 2019-05-09 MED ORDER — MEGESTROL ACETATE 40 MG PO TABS: 40.0000 mg | ORAL_TABLET | Freq: Every day | ORAL | 1 refills | Status: DC

## 2019-05-09 MED ORDER — NAPROXEN 500 MG PO TABS: 500.0000 mg | ORAL_TABLET | Freq: Two times a day (BID) | ORAL | 2 refills | Status: DC

## 2019-05-09 MED ORDER — METRONIDAZOLE 500 MG PO TABS: 500.0000 mg | ORAL_TABLET | Freq: Two times a day (BID) | ORAL | 0 refills | Status: DC

## 2019-05-09 MED ORDER — DOCUSATE SODIUM 100 MG PO CAPS: 100.0000 mg | ORAL_CAPSULE | Freq: Two times a day (BID) | ORAL | 2 refills | Status: DC | PRN

## 2019-05-09 NOTE — Progress Notes (Signed)
GYNECOLOGY OFFICE VISIT NOTE  History:   Chelsea Henson is a 44 y.o. F here today for discussion about management of AUB.  Had did not have any periods for seven months and thought she was perimenopausal, but then bleeding restarted since 03/29/19 and has not stopped.  Ranges from spotting to heavy bleeding with large clots. Called prior to this appointment and was prescribed 40 mg daily which she reports only helped a little bit.  Also feels she has recurrent BV, feels the bleeding is disrupting her vaginal pH balance which she worked hard to restore recently with boric acid therapy.  She denies any other concerns.    Past Medical History:  Diagnosis Date  . Abnormal uterine bleeding   . Bacterial vaginosis   . Enlarged thyroid   . Prediabetes   . Tobacco abuse     Past Surgical History:  Procedure Laterality Date  . CESAREAN SECTION    . TUBAL LIGATION      The following portions of the patient's history were reviewed and updated as appropriate: allergies, current medications, past family history, past medical history, past social history, past surgical history and problem list.   Health Maintenance:  Normal pap and negative HRHPV on 02/21/2019.  Normal mammogram on 05/01/2019.   Review of Systems:  Pertinent items noted in HPI and remainder of comprehensive ROS otherwise negative.  Physical Exam:  BP 128/78   Pulse 63   Wt 225 lb (102.1 kg)   LMP 05/01/2019   BMI 36.32 kg/m  CONSTITUTIONAL: Well-developed, well-nourished female in no acute distress.  HEENT:  Normocephalic, atraumatic. External right and left ear normal. No scleral icterus.  NECK: Normal range of motion, supple, no masses noted on observation SKIN: No rash noted. Not diaphoretic. No erythema. No pallor. MUSCULOSKELETAL: Normal range of motion. No edema noted. NEUROLOGIC: Alert and oriented to person, place, and time. Normal muscle tone coordination. No cranial nerve deficit noted. PSYCHIATRIC: Normal  mood and affect. Normal behavior. Normal judgment and thought content. CARDIOVASCULAR: Normal heart rate noted RESPIRATORY: Effort and breath sounds normal, no problems with respiration noted ABDOMEN: No masses noted. No other overt distention noted.   PELVIC: Normal appearing external genitalia; normal urethral meatus; normal appearing vaginal mucosa and cervix.  Small amount of blood and clots in vagina.  No uterine or adnexal tenderness. Performed in the presence of a chaperone  Labs and Imaging Results for orders placed or performed in visit on 05/07/19 (from the past 168 hour(s))  CBC   Collection Time: 05/07/19  3:25 PM  Result Value Ref Range   WBC 6.4 3.4 - 10.8 x10E3/uL   RBC 3.54 (L) 3.77 - 5.28 x10E6/uL   Hemoglobin 9.7 (L) 11.1 - 15.9 g/dL   Hematocrit 30.3 (L) 34.0 - 46.6 %   MCV 86 79 - 97 fL   MCH 27.4 26.6 - 33.0 pg   MCHC 32.0 31.5 - 35.7 g/dL   RDW 14.7 11.7 - 15.4 %   Platelets 347 150 - 450 x10E3/uL   MM DIGITAL SCREENING BILATERAL  Result Date: 05/02/2019 CLINICAL DATA:  Screening. EXAM: DIGITAL SCREENING BILATERAL MAMMOGRAM WITH CAD COMPARISON:  None. ACR Breast Density Category b: There are scattered areas of fibroglandular density. FINDINGS: There are no findings suspicious for malignancy. Images were processed with CAD. IMPRESSION: No mammographic evidence of malignancy. A result letter of this screening mammogram will be mailed directly to the patient. RECOMMENDATION: Screening mammogram in one year. (Code:SM-B-01Y) BI-RADS CATEGORY  1: Negative.  Electronically Signed   By: Audie Pinto M.D.   On: 05/02/2019 09:33    10/02/2018 TRANSABDOMINAL AND TRANSVAGINAL ULTRASOUND OF PELVIS  CLINICAL DATA:  Abnormal uterine bleeding, heavy bleeding  TECHNIQUE: Both transabdominal and transvaginal ultrasound examinations of the pelvis were performed. Transabdominal technique was performed for global imaging of the pelvis including uterus, ovaries, adnexal regions,  and pelvic cul-de-sac. It was necessary to proceed with endovaginal exam following the transabdominal exam to visualize the uterus, endometrium and LEFT ovary.  COMPARISON:  None  FINDINGS: Uterus  Measurements: 11.7 x 5.4 x 7.3 cm = volume: 243 mL. Heterogeneous echogenicity. At least 3 uterine leiomyomata are identified. Large central anterior uterine leiomyoma likely submucosal 4.5 x 3.4 x 3.8 cm. Posterior subserosal leiomyoma mid uterus 2.4 x 1.8 x 1.7 cm. Anterior leiomyoma 3.5 x 2.5 x 2.7 cm.  Endometrium  Thickness: 9 mm. Tiny cyst within endometrium 5 mm diameter. No additional endometrial fluid.  Right ovary  Measurements: 3.7 x 1.5 x 2.4 cm = volume: 6.8 mL. Normal morphology without mass  Left ovary  Measurements: 2.9 x 1.3 x 2.1 cm = volume: 4.1 mL. Normal morphology without mass  Other findings  No free pelvic fluid.  No adnexal masses.  IMPRESSION: Multiple uterine leiomyomata within a mildly enlarged uterus.  No additional significant abnormalities.  Electronically Signed   By: Lavonia Dana M.D.   On: 10/02/2018 17:02      Assessment and Plan:    1. Iron deficiency anemia due to chronic blood loss Oral iron therapy prescribed for patient. Advise to take at same time as Vitamin C as this helps with iron absorption.  Colace as needed for constipation. - ferrous sulfate (FERROUSUL) 325 (65 FE) MG tablet; Take 1 tablet (325 mg total) by mouth 2 (two) times daily.  Dispense: 60 tablet; Refill: 3 - docusate sodium (COLACE) 100 MG capsule; Take 1 capsule (100 mg total) by mouth 2 (two) times daily as needed for mild constipation or moderate constipation.  Dispense: 30 capsule; Refill: 2 - vitamin C (VITAMIN C) 500 MG tablet; Take 1 tablet (500 mg total) by mouth daily.  Dispense: 30 tablet; Refill: 2  2. Abnormal uterine bleeding (AUB) 3. Fibroids Discussed medical management options for abnormal uterine bleeding including NSAIDs (Naproxen)  and oral progesterone.  Patient desires surgical management too, does not want hysterectomy. History of cesarean section and bilateral tubal ligation.  Recommended Hysteroscopic myomectomy, Dilation and Curettage, Hydrothermal Endometrial Ablation for possible submucosal fibroid, abnormal uterine bleeding.  She was told that she will be contacted by our surgical scheduler regarding the time and date of her surgery; routine preoperative instructions will be given to her by the preoperative nursing team.   She is aware of need for preoperative COVID testing; she will be given further preoperative instructions at that [place] screening visit.  Megace and Naproxen prescribed for now, bleeding precautions reviewed. Metronidazole also prescribed for recurrent BV, will also follow up cervicovaginal ancillary results. - megestrol (MEGACE) 40 MG tablet; Take 2 tablets (80 mg total) by mouth daily. Can increase to two tablets twice a day in the event of heavy bleeding  Dispense: 60 tablet; Refill: 5 - naproxen (NAPROSYN) 500 MG tablet; Take 1 tablet (500 mg total) by mouth 2 (two) times daily with a meal. As needed for pain/bleeding.  Dispense: 60 tablet; Refill: 2 - Cervicovaginal ancillary only( [place]) - metroNIDAZOLE (FLAGYL) 500 MG tablet; Take 1 tablet (500 mg total) by mouth 2 (two) times daily.  Dispense: 14 tablet; Refill: 0  Please refer to After Visit Summary for other counseling recommendations.   Return for any gynecologic concerns.    Total face-to-face time with patient: 30 minutes.  Over 50% of encounter was spent on counseling and coordination of care.   Verita Schneiders, MD, [place] for [employer] [facility], [place]

## 2019-05-09 NOTE — H&P (View-Only) (Signed)
GYNECOLOGY OFFICE VISIT NOTE  History:   Chelsea Henson is a 43 y.o. F here today for discussion about management of AUB.  Had did not have any periods for seven months and thought she was perimenopausal, but then bleeding restarted since 03/29/19 and has not stopped.  Ranges from spotting to heavy bleeding with large clots. Called prior to this appointment and was prescribed 40 mg daily which she reports only helped a little bit.  Also feels she has recurrent BV, feels the bleeding is disrupting her vaginal pH balance which she worked hard to restore recently with boric acid therapy.  She denies any other concerns.    Past Medical History:  Diagnosis Date  . Abnormal uterine bleeding   . Bacterial vaginosis   . Enlarged thyroid   . Prediabetes   . Tobacco abuse     Past Surgical History:  Procedure Laterality Date  . CESAREAN SECTION    . TUBAL LIGATION      The following portions of the patient's history were reviewed and updated as appropriate: allergies, current medications, past family history, past medical history, past social history, past surgical history and problem list.   Health Maintenance:  Normal pap and negative HRHPV on 02/21/2019.  Normal mammogram on 05/01/2019.   Review of Systems:  Pertinent items noted in HPI and remainder of comprehensive ROS otherwise negative.  Physical Exam:  BP 128/78   Pulse 63   Wt 225 lb (102.1 kg)   LMP 05/01/2019   BMI 36.32 kg/m  CONSTITUTIONAL: Well-developed, well-nourished female in no acute distress.  HEENT:  Normocephalic, atraumatic. External right and left ear normal. No scleral icterus.  NECK: Normal range of motion, supple, no masses noted on observation SKIN: No rash noted. Not diaphoretic. No erythema. No pallor. MUSCULOSKELETAL: Normal range of motion. No edema noted. NEUROLOGIC: Alert and oriented to person, place, and time. Normal muscle tone coordination. No cranial nerve deficit noted. PSYCHIATRIC: Normal  mood and affect. Normal behavior. Normal judgment and thought content. CARDIOVASCULAR: Normal heart rate noted RESPIRATORY: Effort and breath sounds normal, no problems with respiration noted ABDOMEN: No masses noted. No other overt distention noted.   PELVIC: Normal appearing external genitalia; normal urethral meatus; normal appearing vaginal mucosa and cervix.  Small amount of blood and clots in vagina.  No uterine or adnexal tenderness. Performed in the presence of a chaperone  Labs and Imaging Results for orders placed or performed in visit on 05/07/19 (from the past 168 hour(s))  CBC   Collection Time: 05/07/19  3:25 PM  Result Value Ref Range   WBC 6.4 3.4 - 10.8 x10E3/uL   RBC 3.54 (L) 3.77 - 5.28 x10E6/uL   Hemoglobin 9.7 (L) 11.1 - 15.9 g/dL   Hematocrit 30.3 (L) 34.0 - 46.6 %   MCV 86 79 - 97 fL   MCH 27.4 26.6 - 33.0 pg   MCHC 32.0 31.5 - 35.7 g/dL   RDW 14.7 11.7 - 15.4 %   Platelets 347 150 - 450 x10E3/uL   MM DIGITAL SCREENING BILATERAL  Result Date: 05/02/2019 CLINICAL DATA:  Screening. EXAM: DIGITAL SCREENING BILATERAL MAMMOGRAM WITH CAD COMPARISON:  None. ACR Breast Density Category b: There are scattered areas of fibroglandular density. FINDINGS: There are no findings suspicious for malignancy. Images were processed with CAD. IMPRESSION: No mammographic evidence of malignancy. A result letter of this screening mammogram will be mailed directly to the patient. RECOMMENDATION: Screening mammogram in one year. (Code:SM-B-01Y) BI-RADS CATEGORY  1: Negative.  Electronically Signed   By: Audie Pinto M.D.   On: 05/02/2019 09:33    10/02/2018 TRANSABDOMINAL AND TRANSVAGINAL ULTRASOUND OF PELVIS  CLINICAL DATA:  Abnormal uterine bleeding, heavy bleeding  TECHNIQUE: Both transabdominal and transvaginal ultrasound examinations of the pelvis were performed. Transabdominal technique was performed for global imaging of the pelvis including uterus, ovaries, adnexal regions,  and pelvic cul-de-sac. It was necessary to proceed with endovaginal exam following the transabdominal exam to visualize the uterus, endometrium and LEFT ovary.  COMPARISON:  None  FINDINGS: Uterus  Measurements: 11.7 x 5.4 x 7.3 cm = volume: 243 mL. Heterogeneous echogenicity. At least 3 uterine leiomyomata are identified. Large central anterior uterine leiomyoma likely submucosal 4.5 x 3.4 x 3.8 cm. Posterior subserosal leiomyoma mid uterus 2.4 x 1.8 x 1.7 cm. Anterior leiomyoma 3.5 x 2.5 x 2.7 cm.  Endometrium  Thickness: 9 mm. Tiny cyst within endometrium 5 mm diameter. No additional endometrial fluid.  Right ovary  Measurements: 3.7 x 1.5 x 2.4 cm = volume: 6.8 mL. Normal morphology without mass  Left ovary  Measurements: 2.9 x 1.3 x 2.1 cm = volume: 4.1 mL. Normal morphology without mass  Other findings  No free pelvic fluid.  No adnexal masses.  IMPRESSION: Multiple uterine leiomyomata within a mildly enlarged uterus.  No additional significant abnormalities.  Electronically Signed   By: Lavonia Dana M.D.   On: 10/02/2018 17:02      Assessment and Plan:    1. Iron deficiency anemia due to chronic blood loss Oral iron therapy prescribed for patient. Advise to take at same time as Vitamin C as this helps with iron absorption.  Colace as needed for constipation. - ferrous sulfate (FERROUSUL) 325 (65 FE) MG tablet; Take 1 tablet (325 mg total) by mouth 2 (two) times daily.  Dispense: 60 tablet; Refill: 3 - docusate sodium (COLACE) 100 MG capsule; Take 1 capsule (100 mg total) by mouth 2 (two) times daily as needed for mild constipation or moderate constipation.  Dispense: 30 capsule; Refill: 2 - vitamin C (VITAMIN C) 500 MG tablet; Take 1 tablet (500 mg total) by mouth daily.  Dispense: 30 tablet; Refill: 2  2. Abnormal uterine bleeding (AUB) 3. Fibroids Discussed medical management options for abnormal uterine bleeding including NSAIDs (Naproxen)  and oral progesterone.  Patient desires surgical management too, does not want hysterectomy. History of cesarean section and bilateral tubal ligation.  Recommended Hysteroscopic myomectomy, Dilation and Curettage, Hydrothermal Endometrial Ablation for possible submucosal fibroid, abnormal uterine bleeding.  She was told that she will be contacted by our surgical scheduler regarding the time and date of her surgery; routine preoperative instructions will be given to her by the preoperative nursing team.   She is aware of need for preoperative COVID testing; she will be given further preoperative instructions at that [place] screening visit.  Megace and Naproxen prescribed for now, bleeding precautions reviewed. Metronidazole also prescribed for recurrent BV, will also follow up cervicovaginal ancillary results. - megestrol (MEGACE) 40 MG tablet; Take 2 tablets (80 mg total) by mouth daily. Can increase to two tablets twice a day in the event of heavy bleeding  Dispense: 60 tablet; Refill: 5 - naproxen (NAPROSYN) 500 MG tablet; Take 1 tablet (500 mg total) by mouth 2 (two) times daily with a meal. As needed for pain/bleeding.  Dispense: 60 tablet; Refill: 2 - Cervicovaginal ancillary only( [place]) - metroNIDAZOLE (FLAGYL) 500 MG tablet; Take 1 tablet (500 mg total) by mouth 2 (two) times daily.  Dispense: 14 tablet; Refill: 0  Please refer to After Visit Summary for other counseling recommendations.   Return for any gynecologic concerns.    Total face-to-face time with patient: 30 minutes.  Over 50% of encounter was spent on counseling and coordination of care.   Verita Schneiders, MD, [place] for [employer] [facility] [employer], [place]

## 2019-05-09 NOTE — Patient Instructions (Signed)
Myomectomy    Myomectomy is a surgery in which a non-cancerous fibroid (myoma) is removed from the uterus. Myomas are tumors made up of fibrous tissue. They are often called fibroid tumors. Fibroid tumors can range from the size of a pea to the size of a grapefruit. In a myomectomy, the fibroid tumor is removed without removing the uterus. Because these tumors are rarely cancerous, this surgery is usually done only if the tumor is growing or causing symptoms such as pain, pressure, bleeding, or pain with intercourse.  Tell a health care provider about:  · Any allergies you have.  · All medicines you are taking, including vitamins, herbs, eye drops, creams, and over-the-counter medicines.  · Any problems you or family members have had with anesthetic medicines.  · Any blood disorders you have.  · Any surgeries you have had.  · Any medical conditions you have.  What are the risks?  Generally, this is a safe procedure. However, problems may occur, including:  · Bleeding.  · Infection.  · Allergic reactions to medicines.  · Damage to other structures or organs.  · Blood clots in the legs, chest, or brain.  · Scar tissue on other organs and in the pelvis. This may require another surgery to remove the scar tissue.  What happens before the procedure?  Staying hydrated  Follow instructions from your health care provider about hydration, which may include:  · Up to 2 hours before the procedure - you may continue to drink clear liquids, such as water, clear fruit juice, black coffee, and plain tea.  Eating and drinking restrictions  Follow instructions from your health care provider about eating and drinking, which may include:  · 8 hours before the procedure - stop eating heavy meals or foods such as meat, fried foods, or fatty foods.  · 6 hours before the procedure - stop eating light meals or foods, such as toast or cereal.  · 6 hours before the procedure - stop drinking milk or drinks that contain milk.  · 2 hours before  the procedure - stop drinking clear liquids.  General instructions  · Ask your health care provider about:  ? Changing or stopping your regular medicines. This is especially important if you are taking diabetes medicines or blood thinners.  ? Taking medicines such as aspirin and ibuprofen. These medicines can thin your blood. Do not take these medicines before your procedure if your health care provider instructs you not to.  · Do not drink alcohol the day before the surgery.  · Do not use any products that contain nicotine or tobacco, such as cigarettes and e-cigarettes, for 2 weeks before the procedure. If you need help quitting, ask your health care provider.  · Plan to have someone take you home from the hospital or clinic. Also arrange for someone to help you with activities during your recovery.  What happens during the procedure?  · To reduce your risk of infection:  ? Your health care team will wash or sanitize their hands.  ? Your skin will be washed with soap.  ? Hair may be removed from the surgical area.  · An IV tube will be inserted into one of your veins. Medicines will be able to flow directly into your body through this IV tube.  · You will be given one or more of the following:  ? A medicine to help you relax (sedative).  ? A medicine to make you fall asleep (general anesthetic).  ·   into your bladder to collect urine.  Your surgeon will use one of the following methods to perform the procedure. The method used will depend on the size, shape, location, and number of fibroids. **Hysteroscopic myomectomy** This method may be used when the fibroid tumor is inside the cavity of the uterus. A long, thin tube with a lens (hysteroscope) will be inserted into the  uterus through the vagina. A saline solution will be put into the uterus. This will expand the uterus and allow the surgeon to see the fibroids. Tools will be passed through the hysteroscope to remove the fibroid tumor in pieces. Laparoscopic myomectomy A few small incisions will be made in the lower abdomen. A thin, lighted tube with a camera (laparoscope) will be inserted through one of the incisions. This will give the surgeon a good view of the area. The fibroid tumor will be removed through the other incisions. The incisions will then be closed with stitches (sutures) or staples. Abdominal myomectomy This method is used when the fibroid tumor cannot be removed with a hysteroscope or laparoscope. The surgery will be done through a larger surgical incision in the abdomen. The fibroid tumor will be removed through this incision. The incision will be closed with sutures or staples. Recovery time will be longer if this method is used. The procedures may vary among health care providers and hospitals. What happens after the procedure?  Your blood pressure, heart rate, breathing rate, and blood oxygen level will be monitored until the medicines you were given have worn off.  The IV access tube and catheter will remain on your body for a period of time.  You may be given medicine for pain or to help you sleep.  You may be given an antibiotic medicine if needed.  Do not drive for 24 hours if you were given a sedative. Summary  Myomectomy is surgery to remove a noncancerous fibroid (myoma) from the uterus.  This surgery is usually done only if the tumor is growing or causing symptoms such as pain, pressure, bleeding, or pain during intercourse.  Follow instructions from your health care provider about eating and drinking before the procedure.  Recovery time from this procedure depends on the method. The abdominal method will require a longer recovery time. This information is not intended to  replace advice given to you by your health care provider. Make sure you discuss any questions you have with your health care provider. Document Revised: 05/18/2018 Document Reviewed: 02/25/2016 Elsevier Patient Education  2020 [place].     Endometrial Ablation Endometrial ablation is a procedure that destroys the thin inner layer of the lining of the uterus (endometrium). This procedure may be done:  To stop heavy periods.  To stop bleeding that is causing anemia.  To control irregular bleeding.  To treat bleeding caused by small tumors (fibroids) in the endometrium. This procedure is often an alternative to major surgery, such as removal of the uterus and cervix (hysterectomy). As a result of this procedure:  You may not be able to have children. However, if you are premenopausal (you have not gone through menopause): ? You may still have a small chance of getting pregnant. ? You will need to use a reliable method of birth control after the procedure to prevent pregnancy.  You may stop having a menstrual period, or you may have only a small amount of bleeding during your period. Menstruation may return several years after the procedure. Tell a health care provider about:  Any allergies you have.  All medicines you are taking, including vitamins, herbs, eye drops, creams, and over-the-counter medicines.  Any problems you or family members have had with the use of anesthetic medicines.  Any blood disorders you have.  Any surgeries you have had.  Any medical conditions you have. What are the risks? Generally, this is a safe procedure. However, problems may occur, including:  A hole (perforation) in the uterus or bowel.  Infection of the uterus, bladder, or vagina.  Bleeding.  Damage to other structures or organs.  An air bubble in the lung (air embolus).  Problems with pregnancy after the procedure.  Failure of the procedure.  Decreased ability to diagnose  cancer in the endometrium. What happens before the procedure?  You will have tests of your endometrium to make sure there are no pre-cancerous cells or cancer cells present.  You may have an ultrasound of the uterus.  You may be given medicines to thin the endometrium.  Ask your health care provider about: ? Changing or stopping your regular medicines. This is especially important if you take diabetes medicines or blood thinners. ? Taking medicines such as aspirin and ibuprofen. These medicines can thin your blood. Do not take these medicines before your procedure if your doctor tells you not to.  Plan to have someone take you home from the hospital or clinic. What happens during the procedure?   You will lie on an exam table with your feet and legs supported as in a pelvic exam.  To lower your risk of infection: ? Your health care team will wash or sanitize their hands and put on germ-free (sterile) gloves. ? Your genital area will be washed with soap.  An IV tube will be inserted into one of your veins.  You will be given a medicine to help you relax (sedative).  A surgical instrument with a light and camera (resectoscope) will be inserted into your vagina and moved into your uterus. This allows your surgeon to see inside your uterus.  Endometrial tissue will be removed using one of the following methods: ? Radiofrequency. This method uses a radiofrequency-alternating electric current to remove the endometrium. ? Cryotherapy. This method uses extreme cold to freeze the endometrium. ? Heated-free liquid. This method uses a heated saltwater (saline) solution to remove the endometrium. ? Microwave. This method uses high-energy microwaves to heat up the endometrium and remove it. ? Thermal balloon. This method involves inserting a catheter with a balloon tip into the uterus. The balloon tip is filled with heated fluid to remove the endometrium. The procedure may vary among health  care providers and hospitals. What happens after the procedure?  Your blood pressure, heart rate, breathing rate, and blood oxygen level will be monitored until the medicines you were given have worn off.  As tissue healing occurs, you may notice vaginal bleeding for 4-6 weeks after the procedure. You may also experience: ? Cramps. ? Thin, watery vaginal discharge that is light pink or brown in color. ? A need to urinate more frequently than usual. ? Nausea.  Do not drive for 24 hours if you were given a sedative.  Do not have sex or insert anything into your vagina until your health care provider approves. Summary  Endometrial ablation is done to treat the many causes of heavy menstrual bleeding.  The procedure may be done only after medications have been tried to control the bleeding.  Plan to have someone take you home from the hospital  or clinic. This information is not intended to replace advice given to you by your health care provider. Make sure you discuss any questions you have with your health care provider. Document Revised: 07/11/2017 Document Reviewed: 02/11/2016 Elsevier Patient Education  [place].

## 2019-05-10 LAB — CERVICOVAGINAL ANCILLARY ONLY
Bacterial Vaginitis (gardnerella): NEGATIVE
Candida Glabrata: NEGATIVE
Candida Vaginitis: NEGATIVE
Chlamydia: NEGATIVE
Comment: NEGATIVE
Comment: NEGATIVE
Comment: NEGATIVE
Comment: NEGATIVE
Comment: NEGATIVE
Comment: NORMAL
Neisseria Gonorrhea: NEGATIVE
Trichomonas: NEGATIVE

## 2019-05-15 ENCOUNTER — Other Ambulatory Visit: Payer: Self-pay

## 2019-05-15 ENCOUNTER — Encounter (HOSPITAL_BASED_OUTPATIENT_CLINIC_OR_DEPARTMENT_OTHER)
Admission: RE | Admit: 2019-05-15 | Discharge: 2019-05-15 | Disposition: A | Discharge status: Home / Self Care | Payer: Medicaid Other | Source: Ambulatory Visit | Attending: Obstetrics & Gynecology | Admitting: Obstetrics & Gynecology

## 2019-05-15 ENCOUNTER — Encounter (HOSPITAL_BASED_OUTPATIENT_CLINIC_OR_DEPARTMENT_OTHER): Payer: Self-pay | Admitting: Obstetrics & Gynecology

## 2019-05-15 DIAGNOSIS — Z01812 Encounter for preprocedural laboratory examination: Secondary | ICD-10-CM | POA: Insufficient documentation

## 2019-05-15 DIAGNOSIS — Z23 Encounter for immunization: Secondary | ICD-10-CM | POA: Diagnosis not present

## 2019-05-15 LAB — BASIC METABOLIC PANEL
Anion gap: 6 (ref 5–15)
BUN: 11 mg/dL (ref 6–20)
CO2: 24 mmol/L (ref 22–32)
Calcium: 8.8 mg/dL — ABNORMAL LOW (ref 8.9–10.3)
Chloride: 108 mmol/L (ref 98–111)
Creatinine, Ser: 0.73 mg/dL (ref 0.44–1.00)
GFR calc Af Amer: >60 mL/min (ref >60)
GFR calc non Af Amer: >60 mL/min (ref >60)
Glucose, Bld: 107 mg/dL — ABNORMAL HIGH (ref 70–99)
Potassium: 4.1 mmol/L (ref 3.5–5.1)
Sodium: 138 mmol/L (ref 135–145)

## 2019-05-15 LAB — CBC
HCT: 30.7 % — ABNORMAL LOW (ref 36.0–46.0)
Hemoglobin: 9.5 g/dL — ABNORMAL LOW (ref 12.0–15.0)
MCH: 26.3 pg (ref 26.0–34.0)
MCHC: 30.9 g/dL (ref 30.0–36.0)
MCV: 85 fL (ref 80.0–100.0)
Platelets: 332 10*3/uL (ref 150–400)
RBC: 3.61 MIL/uL — ABNORMAL LOW (ref 3.87–5.11)
RDW: 16.1 % — ABNORMAL HIGH (ref 11.5–15.5)
WBC: 7 10*3/uL (ref 4.0–10.5)
nRBC: 0 % (ref 0.0–0.2)

## 2019-05-15 LAB — POCT PREGNANCY, URINE: Preg Test, Ur: NEGATIVE

## 2019-05-15 NOTE — Progress Notes (Signed)

## 2019-05-15 NOTE — Progress Notes (Signed)
Reviewed patient's labs with Dr. Gifford Shave.  Surgical procedure will continue as scheduled.

## 2019-05-18 ENCOUNTER — Other Ambulatory Visit (HOSPITAL_COMMUNITY)
Admission: RE | Admit: 2019-05-18 | Discharge: 2019-05-18 | Disposition: A | Discharge status: Home / Self Care | Payer: Medicaid Other | Source: Ambulatory Visit | Attending: Obstetrics & Gynecology | Admitting: Obstetrics & Gynecology

## 2019-05-18 DIAGNOSIS — Z01812 Encounter for preprocedural laboratory examination: Secondary | ICD-10-CM | POA: Insufficient documentation

## 2019-05-18 DIAGNOSIS — Z20822 Contact with and (suspected) exposure to covid-19: Secondary | ICD-10-CM | POA: Insufficient documentation

## 2019-05-19 LAB — SARS CORONAVIRUS 2 (TAT 6-24 HRS): SARS Coronavirus 2: NEGATIVE

## 2019-05-22 ENCOUNTER — Ambulatory Visit (HOSPITAL_BASED_OUTPATIENT_CLINIC_OR_DEPARTMENT_OTHER): Payer: Medicaid Other | Admitting: Anesthesiology

## 2019-05-22 ENCOUNTER — Ambulatory Visit (HOSPITAL_BASED_OUTPATIENT_CLINIC_OR_DEPARTMENT_OTHER)
Admission: RE | Admit: 2019-05-22 | Discharge: 2019-05-22 | Disposition: A | Discharge status: Home / Self Care | Payer: Medicaid Other | Attending: Obstetrics & Gynecology | Admitting: Obstetrics & Gynecology

## 2019-05-22 ENCOUNTER — Encounter (HOSPITAL_BASED_OUTPATIENT_CLINIC_OR_DEPARTMENT_OTHER)
Admission: RE | Disposition: A | Discharge status: Home / Self Care | Payer: Self-pay | Source: Home / Self Care | Attending: Obstetrics & Gynecology

## 2019-05-22 ENCOUNTER — Encounter (HOSPITAL_BASED_OUTPATIENT_CLINIC_OR_DEPARTMENT_OTHER): Payer: Self-pay | Admitting: Obstetrics & Gynecology

## 2019-05-22 ENCOUNTER — Other Ambulatory Visit: Payer: Self-pay

## 2019-05-22 DIAGNOSIS — R7303 Prediabetes: Secondary | ICD-10-CM | POA: Diagnosis not present

## 2019-05-22 DIAGNOSIS — D219 Benign neoplasm of connective and other soft tissue, unspecified: Secondary | ICD-10-CM | POA: Diagnosis present

## 2019-05-22 DIAGNOSIS — F17200 Nicotine dependence, unspecified, uncomplicated: Secondary | ICD-10-CM | POA: Diagnosis not present

## 2019-05-22 DIAGNOSIS — D500 Iron deficiency anemia secondary to blood loss (chronic): Secondary | ICD-10-CM | POA: Insufficient documentation

## 2019-05-22 DIAGNOSIS — N939 Abnormal uterine and vaginal bleeding, unspecified: Secondary | ICD-10-CM | POA: Diagnosis not present

## 2019-05-22 DIAGNOSIS — D250 Submucous leiomyoma of uterus: Secondary | ICD-10-CM | POA: Diagnosis not present

## 2019-05-22 HISTORY — PX: HYSTEROSCOPY: SHX211

## 2019-05-22 HISTORY — PX: DILATATION & CURETTAGE/HYSTEROSCOPY WITH MYOSURE: SHX6511

## 2019-05-22 HISTORY — DX: Other seasonal allergic rhinitis: J30.2

## 2019-05-22 SURGERY — ABLATION, ENDOMETRIUM, HYSTEROSCOPIC
Anesthesia: General | Site: Vagina

## 2019-05-22 MED ORDER — LIDOCAINE 2% (20 MG/ML) 5 ML SYRINGE
INTRAMUSCULAR | Status: AC
  Filled 2019-05-22: qty 5

## 2019-05-22 MED ORDER — SUCCINYLCHOLINE CHLORIDE 200 MG/10ML IV SOSY
PREFILLED_SYRINGE | INTRAVENOUS | Status: AC
  Filled 2019-05-22: qty 10

## 2019-05-22 MED ORDER — ACETAMINOPHEN 500 MG PO TABS
1000.0000 mg | ORAL_TABLET | Freq: Once | ORAL | Status: AC
  Administered 2019-05-22: 1000 mg via ORAL

## 2019-05-22 MED ORDER — LIDOCAINE 2% (20 MG/ML) 5 ML SYRINGE
INTRAMUSCULAR | Status: DC | PRN
  Administered 2019-05-22: 80 mg via INTRAVENOUS

## 2019-05-22 MED ORDER — FENTANYL CITRATE (PF) 100 MCG/2ML IJ SOLN
INTRAMUSCULAR | Status: AC
  Filled 2019-05-22: qty 2

## 2019-05-22 MED ORDER — GABAPENTIN 300 MG PO CAPS
ORAL_CAPSULE | ORAL | Status: AC
  Filled 2019-05-22: qty 1

## 2019-05-22 MED ORDER — FENTANYL CITRATE (PF) 100 MCG/2ML IJ SOLN
25.0000 ug | INTRAMUSCULAR | Status: DC | PRN
  Administered 2019-05-22: 50 ug via INTRAVENOUS

## 2019-05-22 MED ORDER — GABAPENTIN 300 MG PO CAPS
300.0000 mg | ORAL_CAPSULE | ORAL | Status: AC
  Administered 2019-05-22: 300 mg via ORAL

## 2019-05-22 MED ORDER — OXYCODONE-ACETAMINOPHEN 5-325 MG PO TABS: 1.0000 | ORAL_TABLET | Freq: Four times a day (QID) | ORAL | 0 refills | Status: DC | PRN

## 2019-05-22 MED ORDER — DIPHENHYDRAMINE HCL 50 MG/ML IJ SOLN
INTRAMUSCULAR | Status: AC
  Filled 2019-05-22: qty 1

## 2019-05-22 MED ORDER — PROPOFOL 10 MG/ML IV BOLUS
INTRAVENOUS | Status: DC | PRN
  Administered 2019-05-22: 200 mg via INTRAVENOUS

## 2019-05-22 MED ORDER — SOD CITRATE-CITRIC ACID 500-334 MG/5ML PO SOLN
ORAL | Status: AC
  Filled 2019-05-22: qty 30

## 2019-05-22 MED ORDER — PHENYLEPHRINE HCL (PRESSORS) 10 MG/ML IV SOLN
INTRAVENOUS | Status: DC | PRN
  Administered 2019-05-22: 40 ug via INTRAVENOUS

## 2019-05-22 MED ORDER — DEXAMETHASONE SODIUM PHOSPHATE 10 MG/ML IJ SOLN
INTRAMUSCULAR | Status: AC
  Filled 2019-05-22: qty 1

## 2019-05-22 MED ORDER — DIPHENHYDRAMINE HCL 50 MG/ML IJ SOLN
INTRAMUSCULAR | Status: DC | PRN
  Administered 2019-05-22: 12.5 mg via INTRAVENOUS

## 2019-05-22 MED ORDER — PHENYLEPHRINE 40 MCG/ML (10ML) SYRINGE FOR IV PUSH (FOR BLOOD PRESSURE SUPPORT)
PREFILLED_SYRINGE | INTRAVENOUS | Status: AC
  Filled 2019-05-22: qty 10

## 2019-05-22 MED ORDER — ONDANSETRON HCL 4 MG/2ML IJ SOLN
INTRAMUSCULAR | Status: AC
  Filled 2019-05-22: qty 2

## 2019-05-22 MED ORDER — OXYCODONE HCL 5 MG PO TABS
5.0000 mg | ORAL_TABLET | Freq: Once | ORAL | Status: AC
  Administered 2019-05-22: 5 mg via ORAL

## 2019-05-22 MED ORDER — KETOROLAC TROMETHAMINE 30 MG/ML IJ SOLN
INTRAMUSCULAR | Status: DC | PRN
  Administered 2019-05-22: 30 mg via INTRAVENOUS

## 2019-05-22 MED ORDER — DEXAMETHASONE SODIUM PHOSPHATE 4 MG/ML IJ SOLN
INTRAMUSCULAR | Status: DC | PRN
  Administered 2019-05-22: 10 mg via INTRAVENOUS

## 2019-05-22 MED ORDER — KETOROLAC TROMETHAMINE 30 MG/ML IJ SOLN
INTRAMUSCULAR | Status: AC
  Filled 2019-05-22: qty 1

## 2019-05-22 MED ORDER — BUPIVACAINE HCL 0.5 % IJ SOLN
INTRAMUSCULAR | Status: DC | PRN
  Administered 2019-05-22: 30 mL

## 2019-05-22 MED ORDER — FENTANYL CITRATE (PF) 100 MCG/2ML IJ SOLN
50.0000 ug | INTRAMUSCULAR | Status: DC | PRN
  Administered 2019-05-22: 100 ug via INTRAVENOUS
  Administered 2019-05-22: 50 ug via INTRAVENOUS

## 2019-05-22 MED ORDER — PROPOFOL 500 MG/50ML IV EMUL
INTRAVENOUS | Status: AC
  Filled 2019-05-22: qty 50

## 2019-05-22 MED ORDER — LACTATED RINGERS IV SOLN: INTRAVENOUS | Status: DC

## 2019-05-22 MED ORDER — SOD CITRATE-CITRIC ACID 500-334 MG/5ML PO SOLN
30.0000 mL | ORAL | Status: AC
  Administered 2019-05-22: 30 mL via ORAL

## 2019-05-22 MED ORDER — SODIUM CHLORIDE 0.9 % IR SOLN
Status: DC | PRN
  Administered 2019-05-22 (×2): 3000 mL

## 2019-05-22 MED ORDER — ONDANSETRON HCL 4 MG/2ML IJ SOLN
INTRAMUSCULAR | Status: DC | PRN
  Administered 2019-05-22: 4 mg via INTRAVENOUS

## 2019-05-22 MED ORDER — MIDAZOLAM HCL 2 MG/2ML IJ SOLN
1.0000 mg | INTRAMUSCULAR | Status: DC | PRN
  Administered 2019-05-22: 2 mg via INTRAVENOUS

## 2019-05-22 MED ORDER — MIDAZOLAM HCL 2 MG/2ML IJ SOLN
INTRAMUSCULAR | Status: AC
  Filled 2019-05-22: qty 2

## 2019-05-22 MED ORDER — OXYCODONE HCL 5 MG PO TABS
ORAL_TABLET | ORAL | Status: AC
  Filled 2019-05-22: qty 1

## 2019-05-22 MED ORDER — EPHEDRINE SULFATE 50 MG/ML IJ SOLN
INTRAMUSCULAR | Status: DC | PRN
  Administered 2019-05-22: 10 mg via INTRAVENOUS

## 2019-05-22 MED ORDER — ACETAMINOPHEN 500 MG PO TABS
ORAL_TABLET | ORAL | Status: AC
  Filled 2019-05-22: qty 2

## 2019-05-22 MED ORDER — DIPHENHYDRAMINE HCL 50 MG/ML IJ SOLN: INTRAMUSCULAR | Status: DC | PRN

## 2019-05-22 MED ORDER — EPHEDRINE 5 MG/ML INJ
INTRAVENOUS | Status: AC
  Filled 2019-05-22: qty 10

## 2019-05-22 SURGICAL SUPPLY — 22 items
CANISTER SUCT 3000ML PPV (MISCELLANEOUS) ×3 IMPLANT
CATH ROBINSON RED A/P 16FR (CATHETERS) ×3 IMPLANT
DEVICE MYOSURE LITE (MISCELLANEOUS) IMPLANT
DEVICE MYOSURE REACH (MISCELLANEOUS) ×3 IMPLANT
GAUZE 4X4 16PLY RFD (DISPOSABLE) ×3 IMPLANT
GLOVE BIOGEL PI IND STRL 7.0 (GLOVE) ×6 IMPLANT
GLOVE BIOGEL PI INDICATOR 7.0 (GLOVE) ×3
GLOVE ECLIPSE 7.0 STRL STRAW (GLOVE) ×3 IMPLANT
GOWN STRL REUS W/TWL LRG LVL3 (GOWN DISPOSABLE) ×6 IMPLANT
HIBICLENS CHG 4% 4OZ BTL (MISCELLANEOUS) ×3 IMPLANT
KIT PROCEDURE FLUENT (KITS) ×3 IMPLANT
KIT TURNOVER KIT B (KITS) ×3 IMPLANT
MYOSURE XL FIBROID (MISCELLANEOUS)
NEEDLE SPNL 20GX3.5 QUINCKE YW (NEEDLE) ×3 IMPLANT
PACK VAGINAL MINOR WOMEN LF (CUSTOM PROCEDURE TRAY) ×3 IMPLANT
PAD OB MATERNITY 4.3X12.25 (PERSONAL CARE ITEMS) ×3 IMPLANT
PAD PREP 24X48 CUFFED NSTRL (MISCELLANEOUS) ×3 IMPLANT
SEAL ROD LENS SCOPE MYOSURE (ABLATOR) ×3 IMPLANT
SET GENESYS HTA PROCERVA (MISCELLANEOUS) ×3 IMPLANT
SLEEVE SCD COMPRESS KNEE MED (MISCELLANEOUS) ×3 IMPLANT
SYSTEM TISS REMOVAL MYOSURE XL (MISCELLANEOUS) IMPLANT
TOWEL GREEN STERILE FF (TOWEL DISPOSABLE) ×6 IMPLANT

## 2019-05-22 NOTE — Anesthesia Postprocedure Evaluation (Signed)
Anesthesia Post Note  Patient: Chelsea Henson  Procedure(s) Performed: Hysteroscopy with Hydrothermal Ablation  (N/A Vagina ) DILATATION & CURETTAGE/HYSTEROSCOPY WITH MYOSURE (N/A Vagina )     Patient location during evaluation: PACU Anesthesia Type: General Level of consciousness: awake and alert Pain management: pain level controlled Vital Signs Assessment: post-procedure vital signs reviewed and stable Respiratory status: spontaneous breathing, nonlabored ventilation, respiratory function stable and patient connected to nasal cannula oxygen Cardiovascular status: blood pressure returned to baseline and stable Postop Assessment: no apparent nausea or vomiting Anesthetic complications: no    Last Vitals:  Vitals:   05/22/19 1255 05/22/19 1350  BP: 138/90 (!) 144/90  Pulse: 60 61  Resp: 14 16  Temp:  36.7 C  SpO2: 100% 100%    Last Pain:  Vitals:   05/22/19 1350  TempSrc:   PainSc: 4                  Tonie Vizcarrondo L Elisah Parmer

## 2019-05-22 NOTE — Op Note (Signed)
PREOPERATIVE DIAGNOSES:  Abnormal uterine bleeding, submucosal fibroid POSTOPERATIVE DIAGNOSES: The same PROCEDURE: Hysteroscopy, Dilation and Curettage, Myosure Myomectomy, Hydrothermal Endometrial Ablation SURGEON:  Dr. Verita Schneiders  INDICATIONS: 44 y.o. F here for scheduled surgery for the aforementioned diagnoses. Risks of surgery were discussed with the patient including but not limited to: bleeding; infection which may require antibiotics; injury to uterus leading to risk of injury to surrounding intraperitoneal organs, burn injury to vagina or other organs, need for additional procedures including laparoscopy or laparotomy, inability to complete ablation due to uterine or mechanical anomaly, need for trial of another ablation modality, and other postoperative/anesthesia complications.  Patient was informed that there is a high likelihood of success of controlling her symptoms; however about 5% of patients may require further intervention.  Written informed consent was obtained.    FINDINGS:  A 11 week size uterus.  Diffuse proliferative endometrium. Small submucosal fibroid portion in central posterior portion of endometrium.  Normal ostia bilaterally.  ANESTHESIA:   General, paracervical block. ESTIMATED BLOOD LOSS:  10 ml SPECIMENS: Endometrial curettings sent to pathology COMPLICATIONS:  None immediate.  PROCEDURE DETAILS:  The patient was then taken to the operating room where general anesthesia was administered and was found to be adequate.  After an adequate timeout was performed, she was placed in the dorsal lithotomy position and examined; then prepped and draped in the sterile manner.   Her bladder was catheterized for clear, yellow urine. A speculum was then placed in the patient's vagina and a single tooth tenaculum was applied to the anterior lip of the cervix.   A paracervical block using 30 ml of 0.5% Marcaine was administered.  The cervix was sounded to 11 cm and dilated  manually with metal dilators to accommodate the Myosure hysteroscopic apparatus.   Once the cervix was dilated, the Myosure hysteroscope was inserted using normal saline as a suspension medium and the findings above were noted.  Using the Myosure REACH device under direct visualization,  the small submucosal portion of the fibroid was resected.  The device was also used to obtain curettings from all parts of the endometrial cavity under direct visualization.  The Myosure device and hysteroscope were then removed and replaced with the hydrothermal ablation hysteroscopic apparatus. The hysteroscope was inserted under direct visualization also using saline as a medium.  The hydrothermal ablation was then carried out as per protocol.   Complete ablation of the endometrium was observed and the hysteroscope was removed under direct visualization.  No complications were observed.  The tenaculum was removed from the anterior lip of the cervix, and the vaginal speculum was removed after noting good hemostasis.  The patient tolerated the procedure well and was taken to the recovery area awake, extubated and in stable condition.  The patient will be discharged to home as per PACU criteria.  Routine postoperative instructions given.  She was prescribed Percocet, Ibuprofen and Colace.  She will follow up in the office in 2-3 weeks for postoperative evaluation.    Verita Schneiders, MD, [place] for [facility], [place]

## 2019-05-22 NOTE — Discharge Instructions (Signed)
Hysteroscopy, Care After This sheet gives you information about how to care for yourself after your procedure. Your health care provider may also give you more specific instructions. If you have problems or questions, contact your health care provider. What can I expect after the procedure? After the procedure, it is common to have:  Cramping.  Bleeding. This can vary from light spotting to menstrual-like bleeding. Follow these instructions at home: Activity  Rest for 1-2 days after the procedure.  Do not douche, use tampons, or have sex for 2 weeks after the procedure, or until your health care provider approves.  Do not drive for 24 hours after the procedure, or for as long as told by your health care provider.  Do not drive, use heavy machinery, or drink alcohol while taking prescription pain medicines. Medicines   Take over-the-counter and prescription medicines only as told by your health care provider.  Do not take aspirin during recovery. It can increase the risk of bleeding. General instructions  Do not take baths, swim, or use a hot tub until your health care provider approves. Take showers instead of baths for 2 weeks, or for as long as told by your health care provider.  To prevent or treat constipation while you are taking prescription pain medicine, your health care provider may recommend that you: ? Drink enough fluid to keep your urine clear or pale yellow. ? Take over-the-counter or prescription medicines. ? Eat foods that are high in fiber, such as fresh fruits and vegetables, whole grains, and beans. ? Limit foods that are high in fat and processed sugars, such as fried and sweet foods.  Keep all follow-up visits as told by your health care provider. This is important. Contact a health care provider if:  You feel dizzy or lightheaded.  You feel nauseous.  You have abnormal vaginal discharge.  You have a rash.  You have pain that does not get better  with medicine.  You have chills. Get help right away if:  You have bleeding that is heavier than a normal menstrual period.  You have a fever.  You have pain or cramps that get worse.  You develop new abdominal pain.  You faint.  You have pain in your shoulders.  You have shortness of breath. Summary  After the procedure, you may have cramping and some vaginal bleeding.  Do not douche, use tampons, or have sex for 2 weeks after the procedure, or until your health care provider approves.  Do not take baths, swim, or use a hot tub until your health care provider approves. Take showers instead of baths for 2 weeks, or for as long as told by your health care provider.  Report any unusual symptoms to your health care provider.  Keep all follow-up visits as told by your health care provider. This is important. This information is not intended to replace advice given to you by your health care provider. Make sure you discuss any questions you have with your health care provider. Document Revised: 01/06/2017 Document Reviewed: 02/23/2016 Elsevier Patient Education  [employer].   Next Dose of Tylenol at 3:30 PM Next Dose of Ibuprofen at 5:30 PM    Post Anesthesia Home Care Instructions  Activity: Get plenty of rest for the remainder of the day. A responsible individual must stay with you for 24 hours following the procedure.  For the next 24 hours, DO NOT: -Drive a car -Paediatric nurse -Drink alcoholic beverages -Take any medication unless instructed  by your physician -Make any legal decisions or sign important papers.  Meals: Start with liquid foods such as gelatin or soup. Progress to regular foods as tolerated. Avoid greasy, spicy, heavy foods. If nausea and/or vomiting occur, drink only clear liquids until the nausea and/or vomiting subsides. Call your physician if vomiting continues.  Special Instructions/Symptoms: Your throat may feel dry or sore from the  anesthesia or the breathing tube placed in your throat during surgery. If this causes discomfort, gargle with warm salt water. The discomfort should disappear within 24 hours.  If you had a scopolamine patch placed behind your ear for the management of post- operative nausea and/or vomiting:  1. The medication in the patch is effective for 72 hours, after which it should be removed.  Wrap patch in a tissue and discard in the trash. Wash hands thoroughly with soap and water. 2. You may remove the patch earlier than 72 hours if you experience unpleasant side effects which may include dry mouth, dizziness or visual disturbances. 3. Avoid touching the patch. Wash your hands with soap and water after contact with the patch.

## 2019-05-22 NOTE — Anesthesia Procedure Notes (Signed)
Procedure Name: LMA Insertion Date/Time: 05/22/2019 10:58 AM Performed by: Willa Frater, CRNA Pre-anesthesia Checklist: Patient identified, Emergency Drugs available, Suction available and Patient being monitored Patient Re-evaluated:Patient Re-evaluated prior to induction Oxygen Delivery Method: Circle system utilized Preoxygenation: Pre-oxygenation with 100% oxygen Induction Type: IV induction Ventilation: Mask ventilation without difficulty LMA: LMA inserted LMA Size: 5.0 Number of attempts: 1 Airway Equipment and Method: Bite block Placement Confirmation: positive ETCO2 Tube secured with: Tape Dental Injury: Teeth and Oropharynx as per pre-operative assessment

## 2019-05-22 NOTE — Anesthesia Preprocedure Evaluation (Addendum)
Anesthesia Evaluation  Patient identified by MRN, date of birth, ID band Patient awake    Reviewed: Allergy & Precautions, NPO status , Patient's Chart, lab work & pertinent test results  Airway Mallampati: III  TM Distance: >3 FB Neck ROM: Full    Dental no notable dental hx. (+) Teeth Intact, Dental Advisory Given   Pulmonary neg pulmonary ROS, Current Smoker and Patient abstained from smoking.,    Pulmonary exam normal breath sounds clear to auscultation       Cardiovascular negative cardio ROS Normal cardiovascular exam Rhythm:Regular Rate:Normal     Neuro/Psych negative neurological ROS  negative psych ROS   GI/Hepatic negative GI ROS, Neg liver ROS,   Endo/Other  negative endocrine ROS  Renal/GU negative Renal ROS  negative genitourinary   Musculoskeletal negative musculoskeletal ROS (+)   Abdominal   Peds  Hematology  (+) Blood dyscrasia (Hgb 9.5), anemia ,   Anesthesia Other Findings   Reproductive/Obstetrics                            Anesthesia Physical Anesthesia Plan  ASA: II  Anesthesia Plan: General   Post-op Pain Management:    Induction: Intravenous  PONV Risk Score and Plan: 2 and Ondansetron, Dexamethasone and Midazolam  Airway Management Planned: LMA  Additional Equipment:   Intra-op Plan:   Post-operative Plan: Extubation in OR  Informed Consent: I have reviewed the patients History and Physical, chart, labs and discussed the procedure including the risks, benefits and alternatives for the proposed anesthesia with the patient or authorized representative who has indicated his/her understanding and acceptance.     Dental advisory given  Plan Discussed with: CRNA  Anesthesia Plan Comments:         Anesthesia Quick Evaluation

## 2019-05-22 NOTE — Transfer of Care (Signed)
Immediate Anesthesia Transfer of Care Note  Patient: Chelsea Henson  Procedure(s) Performed: DILATATION & CURETTAGE/HYSTEROSCOPY WITH HYDROTHERMAL ABLATION AND VAGINAL MYOMECTOMY WITH MYOSURE (N/A Vagina ) DILATATION & CURETTAGE/HYSTEROSCOPY WITH MYOSURE (N/A Vagina )  Patient Location: PACU  Anesthesia Type:General  Level of Consciousness: awake, alert , oriented and drowsy  Airway & Oxygen Therapy: Patient Spontanous Breathing and Patient connected to face mask oxygen  Post-op Assessment: Report given to RN and Post -op Vital signs reviewed and stable  Post vital signs: Reviewed and stable  Last Vitals:  Vitals Value Taken Time  BP    Temp    Pulse 75 05/22/19 1159  Resp    SpO2 99 % 05/22/19 1159  Vitals shown include unvalidated device data.  Last Pain:  Vitals:   05/22/19 0927  TempSrc: Oral  PainSc: 0-No pain      Patients Stated Pain Goal: 4 ([id number])  Complications: No apparent anesthesia complications

## 2019-05-22 NOTE — Interval H&P Note (Signed)
History and Physical Interval Note 05/22/2019 10:16 AM  Chelsea Henson  has presented today for surgery, with the diagnosis of SUBMUCOSAL FIBROID, AUB.  The various methods of treatment have been discussed with the patient and family. After consideration of risks, benefits and other options for treatment, the patient has consented to  Procedure(s) with comments: [place] as a surgical intervention.  The patient's history has been reviewed, patient examined, no change in status, stable for surgery.  I have reviewed the patient's chart and labs.  Questions were answered to the patient's satisfaction.  To OR when ready.   Verita Schneiders, MD, [place] for [facility], [place]

## 2019-05-23 LAB — SURGICAL PATHOLOGY

## 2019-05-24 ENCOUNTER — Encounter: Payer: Self-pay | Admitting: *Deleted

## 2019-05-29 ENCOUNTER — Encounter: Payer: Self-pay | Admitting: Family Medicine

## 2019-05-29 ENCOUNTER — Ambulatory Visit (INDEPENDENT_AMBULATORY_CARE_PROVIDER_SITE_OTHER): Payer: Medicaid Other | Admitting: Family Medicine

## 2019-05-29 ENCOUNTER — Telehealth: Payer: Self-pay

## 2019-05-29 ENCOUNTER — Other Ambulatory Visit: Payer: Self-pay

## 2019-05-29 VITALS — BP 112/78 | HR 65 | Ht 65.0 in | Wt 222.0 lb

## 2019-05-29 DIAGNOSIS — L508 Other urticaria: Secondary | ICD-10-CM

## 2019-05-29 DIAGNOSIS — E049 Nontoxic goiter, unspecified: Secondary | ICD-10-CM | POA: Diagnosis not present

## 2019-05-29 DIAGNOSIS — R7303 Prediabetes: Secondary | ICD-10-CM | POA: Diagnosis not present

## 2019-05-29 LAB — POCT GLYCOSYLATED HEMOGLOBIN (HGB A1C): Hemoglobin A1C: 5.9 % — AB (ref 4.0–5.6)

## 2019-05-29 MED ORDER — CETIRIZINE HCL 10 MG PO TABS: 10.0000 mg | ORAL_TABLET | Freq: Every day | ORAL | 11 refills | Status: DC

## 2019-05-29 MED ORDER — FAMOTIDINE 40 MG PO TABS: 40.0000 mg | ORAL_TABLET | Freq: Every day | ORAL | 11 refills | Status: DC

## 2019-05-29 NOTE — Progress Notes (Signed)
    SUBJECTIVE:   CHIEF COMPLAINT / HPI:   Hives Has been occurring off and on over the past 20 years Patient cannot identify any triggers Zyrtec was not helpful when prescribed previously Will be itchy, feels like it comes from the "inside" Accompanied by a burning sensation No swelling or shortness of breath Will "chew" benadryl for relief, which is effective  Enlarged thyroid Patient is concerned about her thyroid since she has a history of thyroid nodules.  She has had no heat or cold intolerance, hair or skin changes, or unintentional weight loss or gain.  She is interested in a referral to endocrinology.  PERTINENT  PMH / PSH: Prediabetes, enlarged thyroid, hirsutism, seasonal allergies, tobacco abuse  OBJECTIVE:   BP 112/78   Pulse 65   Ht 5\' 5"  (1.651 m)   Wt 222 lb (100.7 kg)   LMP 05/01/2019   SpO2 100%   BMI 36.94 kg/m   General: well appearing, appears stated age, obese HEENT: Mildly enlarged thyroid on exam, no goiter visualized Cardiac: RRR, no MRG Respiratory: CTAB, no rhonchi, rales, or wheezing, normal work of breathing Skin: no rashes or other lesions, warm and well perfused Psych: appropriate mood and affect   ASSESSMENT/PLAN:   Enlarged thyroid TSH was slightly low in 2020, although T4 was normal.  Will recheck TSH and T4 today.  Will refer to endocrinology per patient request.  Prediabetes Hemoglobin A1c today is 5.9, which continues to be in the prediabetic range.  Patient would be a candidate for Metformin, can discuss this at her next visit.  Chronic urticaria Counseled patient that unfortunately, a cause for chronic urticaria is often not found.  Reassured her that she does not have a history of life-threatening symptoms including swelling of the lips or throat or shortness of breath, but if she does experience the symptoms, she needs to go to the emergency department.  Discussed about medication such as H1 and H2 blockers can help treat this  condition, and she is willing to try them.  Will represcribe Zyrtec and prescribed Pepcid.  Per her request, will refer her to allergy for any further testing if they deem necessary.     Kathrene Alu, MD [place]

## 2019-05-29 NOTE — Telephone Encounter (Signed)
Received call from patient she reports some a new onset burning and pressure on her right side. This pain started two days ago. She has not taken any medication to help with this. I have advised patient to try Tylenol to see if she gets any relief for her pain. I will reach out to the provider to see if there is anything else that can done.

## 2019-05-29 NOTE — Patient Instructions (Signed)
It was nice seeing you today Ms. [place]!  Your hives may respond well to Pepcid and Zyrtec daily.  Although we often do not find a cause for hives, I will refer you to allergy today at your request.  I would also like for you to record each episode that you experience and write down what was occurring and what you ate prior to the episode.  We will use this to see if there are any common factors that could be causing your reaction.  We are checking thyroid levels and will refer you to endocrinology today.  We will also check your hemoglobin A1c.  I will be in touch regarding these results.  If you have any questions or concerns, please feel free to call the clinic.   Be well,  Dr. Shan Levans

## 2019-05-30 DIAGNOSIS — L508 Other urticaria: Secondary | ICD-10-CM | POA: Insufficient documentation

## 2019-05-30 LAB — TSH+FREE T4
Free T4: 1.7 ng/dL (ref 0.82–1.77)
TSH: 0.315 u[IU]/mL — ABNORMAL LOW (ref 0.450–4.500)

## 2019-05-30 NOTE — Assessment & Plan Note (Signed)
TSH was slightly low in 2020, although T4 was normal.  Will recheck TSH and T4 today.  Will refer to endocrinology per patient request.

## 2019-05-30 NOTE — Assessment & Plan Note (Signed)
Counseled patient that unfortunately, a cause for chronic urticaria is often not found.  Reassured her that she does not have a history of life-threatening symptoms including swelling of the lips or throat or shortness of breath, but if she does experience the symptoms, she needs to go to the emergency department.  Discussed about medication such as H1 and H2 blockers can help treat this condition, and she is willing to try them.  Will represcribe Zyrtec and prescribed Pepcid.  Per her request, will refer her to allergy for any further testing if they deem necessary.

## 2019-05-30 NOTE — Assessment & Plan Note (Signed)
Hemoglobin A1c today is 5.9, which continues to be in the prediabetic range.  Patient would be a candidate for Metformin, can discuss this at her next visit.

## 2019-05-31 NOTE — Progress Notes (Signed)
Left a voice message about patient's TSH and free T4.  Her TSH continues to be lower than the normal range, although her free T4 is normal.  This is consistent with a diagnosis of subclinical hyperthyroidism.  Since she is less than 89 years and age, does not have osteoporosis, and does not have heart disease, I would recommend continuing to observe this.  It is also possible that her TSH is normal for her body.  However, she has been referred to endocrinology, so further work-up can be performed there if they think that is necessary.

## 2019-05-31 NOTE — Progress Notes (Signed)
Left voice message regarding hemoglobin A1c result of 5.9.  Told patient that this is in the prediabetic range, so she may benefit from Metformin 500 mg once daily.  I told her that she can call and let me know if she would like for me to send in this medication, and I will be happy to do so.  We can also hold off and continue to observe her hemoglobin A1c.

## 2019-06-13 ENCOUNTER — Encounter: Payer: Self-pay | Admitting: Obstetrics & Gynecology

## 2019-06-13 ENCOUNTER — Ambulatory Visit (INDEPENDENT_AMBULATORY_CARE_PROVIDER_SITE_OTHER): Payer: Medicaid Other | Admitting: Obstetrics & Gynecology

## 2019-06-13 ENCOUNTER — Other Ambulatory Visit: Payer: Self-pay

## 2019-06-13 VITALS — BP 127/84 | HR 74 | Wt 222.0 lb

## 2019-06-13 DIAGNOSIS — Z4889 Encounter for other specified surgical aftercare: Secondary | ICD-10-CM

## 2019-06-13 DIAGNOSIS — N760 Acute vaginitis: Secondary | ICD-10-CM

## 2019-06-13 DIAGNOSIS — Z9889 Other specified postprocedural states: Secondary | ICD-10-CM

## 2019-06-13 MED ORDER — TINIDAZOLE 500 MG PO TABS: 2.0000 g | ORAL_TABLET | Freq: Every day | ORAL | 2 refills | Status: DC

## 2019-06-13 MED ORDER — BORIC ACID CRYS: 600.0000 mg | CRYSTALS | Freq: Every day | 7 refills | Status: DC

## 2019-06-13 NOTE — Progress Notes (Signed)
Subjective:     Chelsea Henson is a 44 y.o. female who presents to the clinic 3 weeks status post Hysteroscopy, Dilation and Curettage, Myosure Myomectomy, Hydrothermal Endometrial Ablation for abnormal uterine bleeding, submucosal fibroid. Having brown, foul-smelling discharge. Eating a regular diet without difficulty. Bowel movements are normal. The patient is not having any pain.  The following portions of the patient's history were reviewed and updated as appropriate: allergies, current medications, past family history, past medical history, past social history, past surgical history and problem list. Normal pap and negative HRHPV on 02/21/2019.  Review of Systems Pertinent items noted in HPI and remainder of comprehensive ROS otherwise negative.    Objective:    BP 127/84   Pulse 74   Wt 222 lb (100.7 kg)   BMI 36.94 kg/m  General:  alert and no distress  Abdomen: soft, bowel sounds active, non-tender  Pelvic:   brown, foul-smelling malodorous discharge   05/22/2019 Surgical Pathology ENDOMETRIUM, CURETTAGE:  - Benign endomyometrium with submucosal leiomyoma.     Assessment:    Doing well postoperatively. Has recurrent BV. Operative findings again reviewed. Pathology report discussed.    Plan:    1. Continue any current medications. 2. Wound care discussed. 3. Activity restrictions: none 4. Anticipated return to work: not applicable. 5.Metronidazole prescribed for recurrent BV. She will also get back to using boric acid therapy long term as prescribed prior to surgery given recurrent vaginitis.  5. Follow up as needed.     Verita Schneiders, MD, [place] for [employer], [place]

## 2019-06-19 ENCOUNTER — Other Ambulatory Visit: Payer: Self-pay | Admitting: Obstetrics & Gynecology

## 2019-06-19 DIAGNOSIS — D219 Benign neoplasm of connective and other soft tissue, unspecified: Secondary | ICD-10-CM

## 2019-06-19 DIAGNOSIS — N939 Abnormal uterine and vaginal bleeding, unspecified: Secondary | ICD-10-CM

## 2019-07-05 ENCOUNTER — Ambulatory Visit: Payer: Medicaid Other | Admitting: Allergy

## 2019-07-28 ENCOUNTER — Emergency Department (HOSPITAL_COMMUNITY)
Admission: EM | Admit: 2019-07-28 | Discharge: 2019-07-28 | Disposition: A | Discharge status: Home / Self Care | Payer: Medicaid Other | Attending: Emergency Medicine | Admitting: Emergency Medicine

## 2019-07-28 ENCOUNTER — Ambulatory Visit (HOSPITAL_COMMUNITY)
Admission: EM | Admit: 2019-07-28 | Discharge: 2019-07-28 | Disposition: A | Discharge status: Home / Self Care | Payer: Medicaid Other

## 2019-07-28 ENCOUNTER — Encounter (HOSPITAL_COMMUNITY): Payer: Self-pay

## 2019-07-28 ENCOUNTER — Emergency Department (HOSPITAL_COMMUNITY): Payer: Medicaid Other

## 2019-07-28 ENCOUNTER — Encounter (HOSPITAL_COMMUNITY): Payer: Self-pay | Admitting: Emergency Medicine

## 2019-07-28 ENCOUNTER — Other Ambulatory Visit: Payer: Self-pay

## 2019-07-28 DIAGNOSIS — R1013 Epigastric pain: Secondary | ICD-10-CM | POA: Diagnosis not present

## 2019-07-28 DIAGNOSIS — R1084 Generalized abdominal pain: Secondary | ICD-10-CM | POA: Diagnosis not present

## 2019-07-28 DIAGNOSIS — Z20822 Contact with and (suspected) exposure to covid-19: Secondary | ICD-10-CM | POA: Insufficient documentation

## 2019-07-28 DIAGNOSIS — F17228 Nicotine dependence, chewing tobacco, with other nicotine-induced disorders: Secondary | ICD-10-CM | POA: Diagnosis not present

## 2019-07-28 DIAGNOSIS — F17200 Nicotine dependence, unspecified, uncomplicated: Secondary | ICD-10-CM | POA: Diagnosis not present

## 2019-07-28 DIAGNOSIS — R1033 Periumbilical pain: Secondary | ICD-10-CM | POA: Insufficient documentation

## 2019-07-28 DIAGNOSIS — R001 Bradycardia, unspecified: Secondary | ICD-10-CM | POA: Diagnosis not present

## 2019-07-28 DIAGNOSIS — F1210 Cannabis abuse, uncomplicated: Secondary | ICD-10-CM | POA: Diagnosis not present

## 2019-07-28 DIAGNOSIS — R109 Unspecified abdominal pain: Secondary | ICD-10-CM | POA: Diagnosis not present

## 2019-07-28 DIAGNOSIS — Z03818 Encounter for observation for suspected exposure to other biological agents ruled out: Secondary | ICD-10-CM | POA: Diagnosis not present

## 2019-07-28 DIAGNOSIS — R1010 Upper abdominal pain, unspecified: Secondary | ICD-10-CM | POA: Diagnosis present

## 2019-07-28 HISTORY — DX: Benign neoplasm of connective and other soft tissue, unspecified: D21.9

## 2019-07-28 LAB — CBC
HCT: 39.5 % (ref 36.0–46.0)
Hemoglobin: 11.5 g/dL — ABNORMAL LOW (ref 12.0–15.0)
MCH: 23.5 pg — ABNORMAL LOW (ref 26.0–34.0)
MCHC: 29.1 g/dL — ABNORMAL LOW (ref 30.0–36.0)
MCV: 80.8 fL (ref 80.0–100.0)
Platelets: 313 10*3/uL (ref 150–400)
RBC: 4.89 MIL/uL (ref 3.87–5.11)
RDW: 20.1 % — ABNORMAL HIGH (ref 11.5–15.5)
WBC: 6 10*3/uL (ref 4.0–10.5)
nRBC: 0 % (ref 0.0–0.2)

## 2019-07-28 LAB — URINALYSIS, ROUTINE W REFLEX MICROSCOPIC
Bilirubin Urine: NEGATIVE
Glucose, UA: NEGATIVE mg/dL
Hgb urine dipstick: NEGATIVE
Ketones, ur: NEGATIVE mg/dL
Leukocytes,Ua: NEGATIVE
Nitrite: NEGATIVE
Protein, ur: NEGATIVE mg/dL
Specific Gravity, Urine: 1.025 (ref 1.005–1.030)
pH: 5 (ref 5.0–8.0)

## 2019-07-28 LAB — COMPREHENSIVE METABOLIC PANEL
ALT: 27 U/L (ref 0–44)
AST: 29 U/L (ref 15–41)
Albumin: 3.8 g/dL (ref 3.5–5.0)
Alkaline Phosphatase: 71 U/L (ref 38–126)
Anion gap: 7 (ref 5–15)
BUN: 9 mg/dL (ref 6–20)
CO2: 27 mmol/L (ref 22–32)
Calcium: 8.9 mg/dL (ref 8.9–10.3)
Chloride: 105 mmol/L (ref 98–111)
Creatinine, Ser: 0.7 mg/dL (ref 0.44–1.00)
GFR calc Af Amer: >60 mL/min (ref >60)
GFR calc non Af Amer: >60 mL/min (ref >60)
Glucose, Bld: 91 mg/dL (ref 70–99)
Potassium: 3.8 mmol/L (ref 3.5–5.1)
Sodium: 139 mmol/L (ref 135–145)
Total Bilirubin: 0.7 mg/dL (ref 0.3–1.2)
Total Protein: 7.1 g/dL (ref 6.5–8.1)

## 2019-07-28 LAB — LIPASE, BLOOD: Lipase: 33 U/L (ref 11–51)

## 2019-07-28 LAB — I-STAT BETA HCG BLOOD, ED (MC, WL, AP ONLY): I-stat hCG, quantitative: <5 m[IU]/mL (ref <5)

## 2019-07-28 LAB — SARS CORONAVIRUS 2 BY RT PCR (HOSPITAL ORDER, PERFORMED IN ~~LOC~~ HOSPITAL LAB): SARS Coronavirus 2: NEGATIVE

## 2019-07-28 MED ORDER — MORPHINE SULFATE (PF) 4 MG/ML IV SOLN
4.0000 mg | Freq: Once | INTRAVENOUS | Status: AC
  Administered 2019-07-28: 4 mg via INTRAVENOUS
  Filled 2019-07-28: qty 1

## 2019-07-28 MED ORDER — FENTANYL CITRATE (PF) 100 MCG/2ML IJ SOLN
50.0000 ug | Freq: Once | INTRAMUSCULAR | Status: AC
  Administered 2019-07-28: 50 ug via INTRAVENOUS
  Filled 2019-07-28: qty 2

## 2019-07-28 MED ORDER — SODIUM CHLORIDE 0.9% FLUSH
3.0000 mL | Freq: Once | INTRAVENOUS | Status: AC
  Administered 2019-07-28: 3 mL via INTRAVENOUS

## 2019-07-28 MED ORDER — HYDROCODONE-ACETAMINOPHEN 5-325 MG PO TABS: 1.0000 | ORAL_TABLET | Freq: Four times a day (QID) | ORAL | 0 refills | Status: DC | PRN

## 2019-07-28 MED ORDER — ONDANSETRON HCL 4 MG/2ML IJ SOLN
4.0000 mg | Freq: Once | INTRAMUSCULAR | Status: AC
  Administered 2019-07-28: 4 mg via INTRAVENOUS
  Filled 2019-07-28: qty 2

## 2019-07-28 MED ORDER — METHOCARBAMOL 500 MG PO TABS: 500.0000 mg | ORAL_TABLET | Freq: Two times a day (BID) | ORAL | 0 refills | Status: DC

## 2019-07-28 MED ORDER — IOHEXOL 300 MG/ML  SOLN
100.0000 mL | Freq: Once | INTRAMUSCULAR | Status: AC | PRN
  Administered 2019-07-28: 100 mL via INTRAVENOUS

## 2019-07-28 NOTE — ED Notes (Signed)
Pt was sent to the ED by the provider. Pt verbalized understanding and agreed. Pt wnet to the ED with family member.

## 2019-07-28 NOTE — ED Notes (Signed)
Urine sent down to lab WITH culture 

## 2019-07-28 NOTE — ED Provider Notes (Signed)
[facility]    CSN: [id number] Arrival date & time: 07/28/19  1238      History   Chief Complaint Chief Complaint  Patient presents with  . Abdominal Pain    HPI Chelsea Henson is a 43 y.o. female.   Patient reports for evaluation of abdominal pain.  She reports yesterday she was doing in the pool when she went to get out she abruptly began to have severe upper abdominal pain.  She reports this was a cramping and aching sensation.  She reports" 20 out of 10" at the time.  She reports she took some Tylenol and ibuprofen and ate some and then laid down for the night.  She reports she woke this morning with continued 8-9 out of 10 pain.  She has not had nausea or vomiting.  Food did not seem to change his pain.  Denies shortness of breath.  Denies being sweaty when having this.  Points directly to the epigastric and left and right upper quadrants regarding pain.  Certain movements make it worse.  Laying down makes it worse.  Denies feeling like it radiates through her.  Does have a somewhat recent laparoscopic procedure for fibroids.  Otherwise no abdominal surgeries.  She reports she has been moving her bowels as usual without much pain.     Past Medical History:  Diagnosis Date  . Abnormal uterine bleeding   . Bacterial vaginosis   . Enlarged thyroid   . Prediabetes   . Seasonal allergies   . Tobacco abuse     Patient Active Problem List   Diagnosis Date Noted  . Chronic urticaria 05/30/2019  . Iron deficiency anemia due to chronic blood loss 05/09/2019  . Fibroids 02/21/2019  . Recurrent vaginitis 01/23/2019  . Hirsutism 09/20/2018  . Abnormal uterine bleeding   . Tobacco abuse   . Prediabetes   . Enlarged thyroid   . Breast abscess     Past Surgical History:  Procedure Laterality Date  . CESAREAN SECTION    . DILATATION & CURETTAGE/HYSTEROSCOPY WITH MYOSURE N/A 05/22/2019   Procedure: DILATATION & CURETTAGE/HYSTEROSCOPY WITH MYOSURE;  Surgeon:  Osborne Oman, MD;  Location: [place] [facility];  Service: Gynecology;  Laterality: N/A;  . HYSTEROSCOPY N/A 05/22/2019   Procedure: Hysteroscopy with Hydrothermal Ablation ;  Surgeon: Osborne Oman, MD;  Location: [facility];  Service: Gynecology;  Laterality: N/A;  . TUBAL LIGATION      OB History   No obstetric history on file.      Home Medications    Prior to Admission medications   Medication Sig Start Date End Date Taking? Authorizing Provider  Boric Acid [facility] 600 mg vaginally at bedtime. Use vaginally every night for two weeks then twice a week 06/13/19   Anyanwu, Sallyanne Havers, MD  cetirizine (ZYRTEC) 10 MG tablet Take 1 tablet (10 mg total) by mouth daily. 05/29/19   Kathrene Alu, MD  tinidazole (TINDAMAX) 500 MG tablet Take 4 tablets (2,000 mg total) by mouth daily with breakfast. For two days 06/13/19   Anyanwu, Sallyanne Havers, MD    Family History Family History  Problem Relation Age of Onset  . Hypertension Mother   . Diabetes Sister   . Depression Paternal Grandmother     Social History Social History   Tobacco Use  . Smoking status: Current Some Day Smoker  . Smokeless tobacco: Current User  Substance Use Topics  . Alcohol use: Yes  Comment: rare  . Drug use: Yes    Types: Marijuana     Allergies   Patient has no known allergies.   Review of Systems Review of Systems   Physical Exam Triage Vital Signs ED Triage Vitals  Enc Vitals Group     BP 07/28/19 1353 136/72     Pulse Rate 07/28/19 1353 (!) 52     Resp 07/28/19 1353 18     Temp 07/28/19 1353 98.2 F (36.8 C)     Temp Source 07/28/19 1353 Oral     SpO2 07/28/19 1353 100 %     Weight --      Height --      Head Circumference --      Peak Flow --      Pain Score 07/28/19 1354 9     Pain Loc --      Pain Edu? --      Excl. in [place]? --    No data found.  Updated Vital Signs BP 136/72 (BP Location: Right Arm)   Pulse (!) 52   Temp 98.2 F (36.8 C)  (Oral)   Resp 18   SpO2 100%   Visual Acuity Right Eye Distance:   Left Eye Distance:   Bilateral Distance:    Right Eye Near:   Left Eye Near:    Bilateral Near:     Physical Exam Vitals and nursing note reviewed.  Constitutional:      General: She is not in acute distress.    Appearance: She is well-developed.     Comments: Patient is visibly in pain  HENT:     Head: Normocephalic and atraumatic.  Eyes:     Conjunctiva/sclera: Conjunctivae normal.  Cardiovascular:     Rate and Rhythm: Regular rhythm. Bradycardia present.     Heart sounds: No murmur heard.   Pulmonary:     Effort: Pulmonary effort is normal. No respiratory distress.     Breath sounds: Normal breath sounds. No wheezing, rhonchi or rales.  Abdominal:     General: Bowel sounds are normal.     Palpations: Abdomen is soft.     Tenderness: There is abdominal tenderness (Exquisitely tender across the upper abdomen and left quadrants.  Tender to light palpation.  ). There is guarding. There is no right CVA tenderness, left CVA tenderness or rebound.     Comments: Tenderness with light percussion around the abdomen.  It is soft however very tender.  Musculoskeletal:     Cervical back: Neck supple.  Skin:    General: Skin is warm and dry.  Neurological:     Mental Status: She is alert.      UC Treatments / Results  Labs (all labs ordered are listed, but only abnormal results are displayed) Labs Reviewed - No data to display  EKG   Radiology No results found.  Procedures Procedures (including critical care time)  Medications Ordered in UC Medications - No data to display  Initial Impression / Assessment and Plan / UC Course  I have reviewed the triage vital signs and the nursing notes.  Pertinent labs & imaging results that were available during my care of the patient were reviewed by me and considered in my medical decision making (see chart for details).     #Epigastric pain Patient is a  44 year old presenting with epigastric pain.  Given exam will refer to the emergency department for further evaluation of severe abdominal pain.  She is nonrigid, doubt surgical abdomen.  Stable vital signs.  Given tenderness on exam doubt cardiac.  However unable to rule out intra-abdominal pathology here in the urgent care setting, such as biliary or pancreatic causes of epigastric pain..  Discussed this with the patient and she agrees to report to [facility] emergency department following discharge.  Feel she can self transport with her husband there Final Clinical Impressions(s) / UC Diagnoses   Final diagnoses:  Epigastric pain     Discharge Instructions     Due to your severe abdominal pain you need to be further evaluated in the ED    ED Prescriptions    None     PDMP not reviewed this encounter.   Purnell Shoemaker, PA-C 07/28/19 1511

## 2019-07-28 NOTE — ED Triage Notes (Signed)
Pt present abdominal pain in the epigastric area. Pt states the pain came while at the pool and constantly getting tight. She also felt a knot in the epigastric area of her stomach.

## 2019-07-28 NOTE — ED Triage Notes (Signed)
C/o mid upper abd pain since yesterday.  Denies nausea, vomiting, and diarrhea.  States she was seen at [facility] and sent to ED.  Pain started yesterday while at the pool.

## 2019-07-28 NOTE — ED Provider Notes (Signed)
[facility] EMERGENCY DEPARTMENT Provider Note   CSN: [id number] Arrival date & time: 07/28/19  1458     History Chief Complaint  Patient presents with  . Abdominal Pain    Chelsea Henson is a 44 y.o. female past medical history of abnormal uterine bleeding, enlarged thyroid, fibroids, prediabetes who presents for evaluation of abdominal pain that began yesterday.  She states that she spent the day swimming with her family.  While she was in the pool, she went to get out and started having upper abdominal pain.  She reports a cramping and sharp pain in her upper abdomen that she felt like "felt like a charley horse in her stomach."  She got out of the pool and rested but the sensation continued.  She reports that since then, it has remained constant and has never gone away but "she was just able to get used to it."  She states that throughout the night, the pain persisted.  She was able to eat dinner and did not feel like eating affected her pain.  She had some mild nausea but denies any vomiting.  Her last bowel movement was this morning was normal.  She states she took Tylenol and ibuprofen with no improvement in her symptoms.  She states that her pain is worse when she moves, bends, when you touch it.  She went to urgent care and was evaluated and sent to the ER for further evaluation.  She denies any fevers, shortness of breath, chest pain, diarrhea, dysuria, hematuria.  She reports she had a recent surgery to remove some fibroids but states it was laparoscopic.  No other recent surgeries on abdomen.  She does not recall any specific trauma, injury preceding her symptoms.  The history is provided by the patient.       Past Medical History:  Diagnosis Date  . Abnormal uterine bleeding   . Bacterial vaginosis   . Enlarged thyroid   . Fibroids   . Prediabetes   . Seasonal allergies   . Tobacco abuse     Patient Active Problem List   Diagnosis Date Noted  .  Chronic urticaria 05/30/2019  . Iron deficiency anemia due to chronic blood loss 05/09/2019  . Fibroids 02/21/2019  . Recurrent vaginitis 01/23/2019  . Hirsutism 09/20/2018  . Abnormal uterine bleeding   . Tobacco abuse   . Prediabetes   . Enlarged thyroid   . Breast abscess     Past Surgical History:  Procedure Laterality Date  . CESAREAN SECTION    . DILATATION & CURETTAGE/HYSTEROSCOPY WITH MYOSURE N/A 05/22/2019   Procedure: DILATATION & CURETTAGE/HYSTEROSCOPY WITH MYOSURE;  Surgeon: Osborne Oman, MD;  Location: [place];  Service: Gynecology;  Laterality: N/A;  . HYSTEROSCOPY N/A 05/22/2019   Procedure: Hysteroscopy with Hydrothermal Ablation ;  Surgeon: Osborne Oman, MD;  Location: [place];  Service: Gynecology;  Laterality: N/A;  . TUBAL LIGATION       OB History   No obstetric history on file.     Family History  Problem Relation Age of Onset  . Hypertension Mother   . Diabetes Sister   . Depression Paternal Grandmother     Social History   Tobacco Use  . Smoking status: Current Some Day Smoker  . Smokeless tobacco: Current User  Substance Use Topics  . Alcohol use: Yes    Comment: rare  . Drug use: Yes    Types: Marijuana  Home Medications Prior to Admission medications   Medication Sig Start Date End Date Taking? Authorizing Provider  acetaminophen (TYLENOL) 500 MG tablet Take 500 mg by mouth daily as needed (pain).   Yes [provider]  Boric Acid CRYS Place 600 mg vaginally at bedtime. Use vaginally every night for two weeks then twice a week Patient taking differently: Place 600 mg vaginally at bedtime as needed (BV itching/odor). Compounded at [facility] 06/13/19  Yes Anyanwu, Sallyanne Havers, MD  cetirizine (ZYRTEC) 10 MG tablet Take 1 tablet (10 mg total) by mouth daily. Patient taking differently: Take 10 mg by mouth daily as needed for allergies.  05/29/19  Yes Winfrey, Alcario Drought, MD    diphenhydrAMINE (BENADRYL) 25 MG tablet Take 25 mg by mouth daily as needed for itching.   Yes [provider]  ibuprofen (ADVIL) 200 MG tablet Take 200 mg by mouth daily as needed (pain).   Yes [provider]  HYDROcodone-acetaminophen (NORCO/VICODIN) 5-325 MG tablet Take 1-2 tablets by mouth every 6 (six) hours as needed. 07/28/19   Volanda Napoleon, PA-C  methocarbamol (ROBAXIN) 500 MG tablet Take 1 tablet (500 mg total) by mouth 2 (two) times daily. 07/28/19   Volanda Napoleon, PA-C    Allergies    Patient has no known allergies.  Review of Systems   Review of Systems  Constitutional: Negative for fever.  Respiratory: Negative for cough and shortness of breath.   Cardiovascular: Negative for chest pain.  Gastrointestinal: Positive for abdominal pain and nausea. Negative for vomiting.  Genitourinary: Negative for dysuria and hematuria.  Neurological: Negative for weakness, numbness and headaches.  All other systems reviewed and are negative.   Physical Exam Updated Vital Signs BP 133/62   Pulse (!) 52   Temp 98.4 F (36.9 C) (Oral)   Resp 20   SpO2 100%   Physical Exam Vitals and nursing note reviewed.  Constitutional:      Appearance: Normal appearance. She is well-developed.     Comments: Appears uncomfortable but NAD.   HENT:     Head: Normocephalic and atraumatic.  Eyes:     General: Lids are normal.     Conjunctiva/sclera: Conjunctivae normal.     Pupils: Pupils are equal, round, and reactive to light.  Cardiovascular:     Rate and Rhythm: Normal rate and regular rhythm.     Pulses: Normal pulses.     Heart sounds: Normal heart sounds. No murmur heard.  No friction rub. No gallop.   Pulmonary:     Effort: Pulmonary effort is normal.     Breath sounds: Normal breath sounds.     Comments: Lungs clear to auscultation bilaterally.  Symmetric chest rise.  No wheezing, rales, rhonchi. Abdominal:     Palpations: Abdomen is soft. Abdomen is not  rigid.     Tenderness: There is abdominal tenderness in the epigastric area and periumbilical area. There is guarding. There is no right CVA tenderness or left CVA tenderness. Negative signs include Murphy's sign.     Comments: Abdomen is soft, nondistended.  Tenderness in epigastric, periumbilical region.  She does have some voluntary guarding noted.  No rigidity.  No CVA tenderness noted bilaterally.  Negative Murphy sign.  No tenderness noted McBurney's point.  Musculoskeletal:        General: Normal range of motion.     Cervical back: Full passive range of motion without pain.  Skin:    General: Skin is warm and dry.  Capillary Refill: Capillary refill takes less than 2 seconds.  Neurological:     Mental Status: She is alert and oriented to person, place, and time.  Psychiatric:        Speech: Speech normal.     ED Results / Procedures / Treatments   Labs (all labs ordered are listed, but only abnormal results are displayed) Labs Reviewed  CBC - Abnormal; Notable for the following components:      Result Value   Hemoglobin 11.5 (*)    MCH 23.5 (*)    MCHC 29.1 (*)    RDW 20.1 (*)    All other components within normal limits  URINALYSIS, ROUTINE W REFLEX MICROSCOPIC - Abnormal; Notable for the following components:   APPearance HAZY (*)    All other components within normal limits  SARS CORONAVIRUS 2 BY RT PCR (HOSPITAL ORDER, [facility])  LIPASE, BLOOD  COMPREHENSIVE METABOLIC PANEL  I-STAT BETA HCG BLOOD, ED ([facility], [facility], [facility] ONLY)    EKG EKG Interpretation  Date/Time:  Sunday July 28 2019 15:14:34 EDT Ventricular Rate:  56 PR Interval:  158 QRS Duration: 72 QT Interval:  420 QTC Calculation: 405 R Axis:   74 Text Interpretation: Sinus bradycardia Otherwise normal ECG No significant change since prior 7/20 Confirmed by Aletta Edouard [phone]) on 07/28/2019 5:17:15 PM   Radiology CT ABDOMEN PELVIS W CONTRAST  Result Date:  07/28/2019 CLINICAL DATA:  44 year old female with abdominal pain. EXAM: CT ABDOMEN AND PELVIS WITH CONTRAST TECHNIQUE: Multidetector CT imaging of the abdomen and pelvis was performed using the standard protocol following bolus administration of intravenous contrast. CONTRAST:  130mL OMNIPAQUE IOHEXOL 300 MG/ML  SOLN COMPARISON:  None. FINDINGS: Lower chest: The visualized lung bases are clear. No intra-abdominal free air or free fluid. Hepatobiliary: The liver is unremarkable. No intrahepatic biliary ductal dilatation. No calcified gallstone or pericholecystic fluid. Pancreas: Unremarkable. No pancreatic ductal dilatation or surrounding inflammatory changes. Spleen: Normal in size without focal abnormality. Adrenals/Urinary Tract: Mild left adrenal thickening/hyperplasia. The right adrenal gland is unremarkable. The kidneys, visualized ureters, and urinary bladder appear unremarkable. Stomach/Bowel: There is no bowel obstruction or active inflammation. The appendix is normal. Vascular/Lymphatic: The abdominal aorta and IVC unremarkable. No portal venous gas. There is no adenopathy. Reproductive: The uterus is anteverted. Multiple uterine fibroids noted. Left tubal ligation clips. The right tubal ligation clip is located in the posterior pelvis. No adnexal masses identified. Other: Small fat containing supraumbilical hernia. Musculoskeletal: No acute osseous pathology. IMPRESSION: 1. No acute intra-abdominal or pelvic pathology. No bowel obstruction. Normal appendix. 2. Uterine fibroids. Electronically Signed   By: Anner Crete M.D.   On: 07/28/2019 19:25    Procedures Procedures (including critical care time)  Medications Ordered in ED Medications  sodium chloride flush (NS) 0.9 % injection 3 mL (3 mLs Intravenous Given 07/28/19 1802)  ondansetron (ZOFRAN) injection 4 mg (4 mg Intravenous Given 07/28/19 1800)  morphine 4 MG/ML injection 4 mg (4 mg Intravenous Given 07/28/19 1759)  iohexol (OMNIPAQUE)  300 MG/ML solution 100 mL (100 mLs Intravenous Contrast Given 07/28/19 1842)  fentaNYL (SUBLIMAZE) injection 50 mcg (50 mcg Intravenous Given 07/28/19 1941)  ondansetron (ZOFRAN) injection 4 mg (4 mg Intravenous Given 07/28/19 1940)    ED Course  I have reviewed the triage vital signs and the nursing notes.  Pertinent labs & imaging results that were available during my care of the patient were reviewed by me and considered in my medical decision making (see  chart for details).    MDM Rules/Calculators/A&P                          44 year old female who presents for evaluation of abdominal pain that began yesterday.  She states she first noticed the pain while she was in the pool.  No preceding trauma, injury.  She states it has not been affected by food.  She has not any chest pain, difficulty breathing.  Denies any dysuria, hematuria.  She reports that she has continued experiencing pain.  She went to urgent care and was prompted to go to the ED for further evaluation.  On initially arrival, she is afebrile, nontoxic-appearing.  Vital signs are stable.  She does appear uncomfortable but no acute distress.  On exam, she has tenderness palpation noted to her epigastric and periumbilical region.  She does have some guarding noted but no rigidity.  Concern for infectious process though seems less likely versus musculoskeletal injury versus other intra-abdominal pathology.  Low suspicion for hepatobiliary etiology.  History/physical exam is not concerning for ACS etiology.  Do not suspect dissection.  Labs ordered at triage.  We will plan for CT on pelvis for further evaluation.  I-STAT beta negative.  CMP is unremarkable.  CBC shows no leukocytosis.  Hemoglobin stable at 11.5.  Lipase is normal.  UA is negative for any infectious etiology.  Will obtain CT on pelvis for evaluation of any intra abdominal pathology.  CT on pelvis shows no acute abnormalities.  No evidence of bowel obstruction.  Normal  appendix.  She does have mention of uterine fibroids.  At this time, suspect this may be musculoskeletal related.  She does report that the pain is worse when she moves, bends which leads me to think it is more musculoskeletal in etiology.  She is hemodynamically stable and otherwise well-appearing.  We will plan to treat with muscle relaxers, pain medication.  I discussed results with patient.  She is agreeable to plan.  Her repeat abdominal exam still show she has some mild tenderness.  She does report some improvement in pain after analgesics. At this time, patient exhibits no emergent life-threatening condition that require further evaluation in ED or admission. Discussed patient with Dr. Melina Copa who is agreeable. Patient had ample opportunity for questions and discussion. All patient's questions were answered with full understanding. Strict return precautions discussed. Patient expresses understanding and agreement to plan.   Portions of this note were generated with [employer]. Dictation errors may occur despite best attempts at proofreading.  Final Clinical Impression(s) / ED Diagnoses Final diagnoses:  Generalized abdominal pain    Rx / DC Orders ED Discharge Orders         Ordered    methocarbamol (ROBAXIN) 500 MG tablet  2 times daily     Discontinue  Reprint     07/28/19 2031    HYDROcodone-acetaminophen (NORCO/VICODIN) 5-325 MG tablet  Every 6 hours PRN     Discontinue  Reprint     07/28/19 2031           Volanda Napoleon, PA-C 07/28/19 2220    Hayden Rasmussen, MD 07/29/19 1150

## 2019-07-28 NOTE — Discharge Instructions (Signed)
As we discussed, your work-up today looked reassuring.  Your CT scan did not show any acute abnormalities that would be the cause of your symptoms.  Take Robaxin as prescribed. This medication will make you drowsy so do not drive or drink alcohol when taking it.  Take pain medications as directed for break through pain. Do not drive or operate machinery while taking this medication.   You can take Tylenol or Ibuprofen as directed for pain. You can alternate Tylenol and Ibuprofen every 4 hours. If you take Tylenol at 1pm, then you can take Ibuprofen at 5pm. Then you can take Tylenol again at 9pm.   Follow-up with your primary care doctor.  Return the emergency department for any worsening abdominal pain, vomiting, shortness of breath, fevers or any other worsening concerning symptoms.

## 2019-07-28 NOTE — Discharge Instructions (Addendum)
Due to your severe abdominal pain you need to be further evaluated in the ED

## 2019-07-28 NOTE — ED Notes (Signed)
Discharge instructions including prescription and pain management discussed with pt. Pt verbalized understanding with no questions at this time. Pt to go home with spouse.

## 2019-09-13 ENCOUNTER — Ambulatory Visit: Payer: Medicaid Other | Admitting: Allergy

## 2019-10-04 ENCOUNTER — Ambulatory Visit: Payer: Self-pay

## 2019-10-04 ENCOUNTER — Other Ambulatory Visit: Payer: Self-pay

## 2019-10-04 ENCOUNTER — Ambulatory Visit (INDEPENDENT_AMBULATORY_CARE_PROVIDER_SITE_OTHER): Payer: Medicaid Other | Admitting: *Deleted

## 2019-10-04 ENCOUNTER — Other Ambulatory Visit (HOSPITAL_COMMUNITY)
Admission: RE | Admit: 2019-10-04 | Discharge: 2019-10-04 | Disposition: A | Discharge status: Home / Self Care | Payer: Medicaid Other | Source: Ambulatory Visit | Attending: Obstetrics & Gynecology | Admitting: Obstetrics & Gynecology

## 2019-10-04 VITALS — BP 142/84 | HR 64 | Wt 217.0 lb

## 2019-10-04 DIAGNOSIS — N760 Acute vaginitis: Secondary | ICD-10-CM | POA: Diagnosis not present

## 2019-10-04 NOTE — Progress Notes (Signed)
SUBJECTIVE:  44 y.o. female complains of recurrent BV and just wanted to be checked today. Denies abnormal vaginal bleeding or significant pelvic pain or fever. No UTI symptoms. Denies history of known exposure to STD.  No LMP recorded. Patient is premenopausal.  OBJECTIVE:  She appears well, afebrile. Urine dipstick: not done.  ASSESSMENT:  Recurrent Vaginitis     PLAN:  GC, chlamydia, trichomonas, BVAG, CVAG probe sent to lab. Treatment: To be determined once lab results are received ROV prn if symptoms persist or worsen.

## 2019-10-04 NOTE — Progress Notes (Signed)
Patient was assessed and managed by nursing staff during this encounter. I have reviewed the chart and agree with the documentation and plan. I have also made any necessary editorial changes.  Mora Bellman, MD 10/04/2019 11:31 AM

## 2019-10-07 LAB — CERVICOVAGINAL ANCILLARY ONLY
Bacterial Vaginitis (gardnerella): NEGATIVE
Candida Glabrata: NEGATIVE
Candida Vaginitis: NEGATIVE
Chlamydia: NEGATIVE
Comment: NEGATIVE
Comment: NEGATIVE
Comment: NEGATIVE
Comment: NEGATIVE
Comment: NEGATIVE
Comment: NORMAL
Neisseria Gonorrhea: NEGATIVE
Trichomonas: NEGATIVE

## 2019-10-10 ENCOUNTER — Ambulatory Visit (INDEPENDENT_AMBULATORY_CARE_PROVIDER_SITE_OTHER): Payer: Medicaid Other | Admitting: Obstetrics & Gynecology

## 2019-10-10 ENCOUNTER — Other Ambulatory Visit: Payer: Self-pay

## 2019-10-10 ENCOUNTER — Encounter: Payer: Self-pay | Admitting: Obstetrics & Gynecology

## 2019-10-10 VITALS — BP 134/88 | HR 58 | Wt 218.0 lb

## 2019-10-10 DIAGNOSIS — R102 Pelvic and perineal pain: Secondary | ICD-10-CM | POA: Diagnosis not present

## 2019-10-10 DIAGNOSIS — D219 Benign neoplasm of connective and other soft tissue, unspecified: Secondary | ICD-10-CM

## 2019-10-10 NOTE — Progress Notes (Signed)
GYNECOLOGY OFFICE VISIT NOTE  History:   Nevaen Tredway is a 44 y.o. F here today reporting pelvic and vaginal pressure. This was present before HTA in April 2021, but went away with surgery.  It has now returned. Mild-moderate, sometimes has pain on left or right side. Has known fibroids.  No urinary symptoms, had negative UTI evaluation. Also had recent negative discharge analysis. She denies any current abnormal vaginal discharge, bleeding, pelvic pain or other concerns,  No periods/bleeding since ablation and patient is very happy.    Past Medical History:  Diagnosis Date  . Abnormal uterine bleeding   . Bacterial vaginosis   . Enlarged thyroid   . Fibroids   . Prediabetes   . Seasonal allergies   . Tobacco abuse     Past Surgical History:  Procedure Laterality Date  . CESAREAN SECTION    . DILATATION & CURETTAGE/HYSTEROSCOPY WITH MYOSURE N/A 05/22/2019   Procedure: DILATATION & CURETTAGE/HYSTEROSCOPY WITH MYOSURE;  Surgeon: Osborne Oman, MD;  Location: [place];  Service: Gynecology;  Laterality: N/A;  . HYSTEROSCOPY N/A 05/22/2019   Procedure: Hysteroscopy with Hydrothermal Ablation ;  Surgeon: Osborne Oman, MD;  Location: [place];  Service: Gynecology;  Laterality: N/A;  . TUBAL LIGATION      The following portions of the patient's history were reviewed and updated as appropriate: allergies, current medications, past family history, past medical history, past social history, past surgical history and problem list.   Health Maintenance:  Normal pap and negative HRHPV on 02/21/2019.  Normal mammogram on 05/01/2019.   Review of Systems:  Pertinent items noted in HPI and remainder of comprehensive ROS otherwise negative.  Physical Exam:  BP 134/88   Pulse (!) 58   Wt 218 lb (98.9 kg)   BMI 36.28 kg/m  CONSTITUTIONAL: Well-developed, well-nourished female in no acute distress.  HEENT:  Normocephalic, atraumatic. External  right and left ear normal. No scleral icterus.  NECK: Normal range of motion, supple, no masses noted on observation SKIN: No rash noted. Not diaphoretic. No erythema. No pallor. MUSCULOSKELETAL: Normal range of motion. No edema noted. NEUROLOGIC: Alert and oriented to person, place, and time. Normal muscle tone coordination. No cranial nerve deficit noted. PSYCHIATRIC: Normal mood and affect. Normal behavior. Normal judgment and thought content. CARDIOVASCULAR: Normal heart rate noted RESPIRATORY: Effort and breath sounds normal, no problems with respiration noted ABDOMEN: No masses noted. No other overt distention noted.   PELVIC: Normal appearing external genitalia; normal urethral meatus; normal appearing vaginal mucosa and cervix.  No abnormal discharge noted.  Enlarged uterine size due to fibroids, no other palpable masses, no uterine or adnexal tenderness. Performed in the presence of a chaperone  Labs and Imaging Results for orders placed or performed in visit on 10/04/19 (from the past 168 hour(s))  Cervicovaginal ancillary only   Collection Time: 10/04/19  8:45 AM  Result Value Ref Range   Neisseria Gonorrhea Negative    Chlamydia Negative    Trichomonas Negative    Bacterial Vaginitis (gardnerella) Negative    Candida Vaginitis Negative    Candida Glabrata Negative    Comment Normal Reference Range Candida Species - Negative    Comment Normal Reference Range Candida Galbrata - Negative    Comment Normal Reference Range Trichomonas - Negative    Comment      Normal Reference Range Bacterial Vaginosis - Negative   Comment Normal Reference Ranger Chlamydia - Negative    Comment  Normal Reference Range Neisseria Gonorrhea - Negative   No results found.    Assessment and Plan:      1. Fibroids 2. Pelvic pressure in female Likely secondary to fibroids, will obtain ultrasound and compare fibroid sizes to last one in 09/2018.  No other concerns.  No acute intervention needed  for now.  - US PELVIC COMPLETE WITH TRANSVAGINAL; Future  Routine preventative health maintenance measures emphasized. Please refer to After Visit Summary for other counseling recommendations.   Return for any gynecologic concerns.    Total face-to-face time with patient: 15 minutes.  Over 50% of encounter was spent on counseling and coordination of care.   Verita Schneiders, MD, [place] for [employer], [place]

## 2019-10-11 DIAGNOSIS — Z20822 Contact with and (suspected) exposure to covid-19: Secondary | ICD-10-CM | POA: Diagnosis not present

## 2019-10-11 LAB — POCT URINALYSIS DIPSTICK
Blood, UA: NEGATIVE
Leukocytes, UA: NEGATIVE

## 2019-10-11 NOTE — Addendum Note (Signed)
Addended by: Crosby Oyster on: 10/11/2019 08:12 AM   Modules accepted: Orders

## 2019-10-18 ENCOUNTER — Ambulatory Visit (INDEPENDENT_AMBULATORY_CARE_PROVIDER_SITE_OTHER): Payer: Medicaid Other | Admitting: Allergy

## 2019-10-18 ENCOUNTER — Encounter: Payer: Self-pay | Admitting: Allergy

## 2019-10-18 ENCOUNTER — Ambulatory Visit: Payer: Medicaid Other

## 2019-10-18 ENCOUNTER — Other Ambulatory Visit: Payer: Self-pay

## 2019-10-18 VITALS — BP 130/70 | HR 68 | Temp 97.8°F | Resp 16 | Ht 64.0 in | Wt 216.0 lb

## 2019-10-18 DIAGNOSIS — L299 Pruritus, unspecified: Secondary | ICD-10-CM | POA: Diagnosis not present

## 2019-10-18 DIAGNOSIS — L2489 Irritant contact dermatitis due to other agents: Secondary | ICD-10-CM

## 2019-10-18 DIAGNOSIS — L503 Dermatographic urticaria: Secondary | ICD-10-CM | POA: Diagnosis not present

## 2019-10-18 MED ORDER — LEVOCETIRIZINE DIHYDROCHLORIDE 5 MG PO TABS: 5.0000 mg | ORAL_TABLET | Freq: Every evening | ORAL | 5 refills | Status: DC

## 2019-10-18 NOTE — Progress Notes (Signed)
New Patient Note  RE: Chelsea Henson MRN: [id number] DOB: Feb 10, 1975 Date of Office Visit: 10/18/2019  Referring provider: Kathrene Alu, MD Primary care provider: Donney Dice, DO  Chief Complaint: itch  History of present illness: Chelsea Henson is a 44 y.o. female presenting today for consultation for chronic pruritus.  She states for years she has been having itching that is very random in onset.  She has noted if she is around dusty things and since moving to [place] 2 years ago sometimes being outside can trigger her to itch.  She states the itch can worsen to feel like burning sensation  She states she does not have any warning signs prior to itch.  Can occur in any environment.  She has tried a bland diet to see if it was spices or other seasoning and this did not change symptoms. Has not noted any particular foods that trigger itch.  She has been chewing benadryl when she gets itchy and after about 30 minutes she is better.  No associated swelling.  She reports once she scratches she can develop hives.  She did provide a picture that was consistent with an urticarial lesion. She has tried doing hot and cold showers which has not helped.  No other household members with similar symptoms.  No fevers. No weight changes.  No travel or preceding illness.  She states she has not changed any soaps in decades or her body products.  She reports her PCP did recommend she take zyrtec however did not try this as reports she doesn't like to take medication consistently.    She states she carries her own toilet paper as certain toilet paper can cause her to itch after use.  She uses cottonelle purple brand that she knows does not bother her.    No history of asthma or eczema.  May have runny nose when abrupt temperature changes.     Review of systems: Review of Systems  Constitutional: Negative.   HENT: Negative.   Eyes: Negative.   Respiratory: Negative.   Cardiovascular:  Negative.   Gastrointestinal: Negative.   Musculoskeletal: Negative.   Skin: Positive for itching.  Neurological: Negative.     All other systems negative unless noted above in HPI  Past medical history: Past Medical History:  Diagnosis Date  . Abnormal uterine bleeding   . Bacterial vaginosis   . Enlarged thyroid   . Fibroids   . Prediabetes   . Seasonal allergies   . Tobacco abuse     Past surgical history: Past Surgical History:  Procedure Laterality Date  . CESAREAN SECTION    . DILATATION & CURETTAGE/HYSTEROSCOPY WITH MYOSURE N/A 05/22/2019   Procedure: DILATATION & CURETTAGE/HYSTEROSCOPY WITH MYOSURE;  Surgeon: Osborne Oman, MD;  Location: [facility];  Service: Gynecology;  Laterality: N/A;  . HYSTEROSCOPY N/A 05/22/2019   Procedure: Hysteroscopy with Hydrothermal Ablation ;  Surgeon: Osborne Oman, MD;  Location: [facility];  Service: Gynecology;  Laterality: N/A;  . TUBAL LIGATION      Family history:  Family History  Problem Relation Age of Onset  . Hypertension Mother   . Diabetes Sister   . Depression Paternal Grandmother   . Allergic rhinitis Neg Hx   . Angioedema Neg Hx   . Asthma Neg Hx   . Eczema Neg Hx   . Immunodeficiency Neg Hx   . Urticaria Neg Hx     Social history: Lives in a home  with carpeting in the bedroom with electric heating and central cooling.  No pets in the home.  Cats outside the home.  No concern for water damage, mildew or roaches in the home.  She is a Art therapist.  She is a current smoker of 1/2 pk per week starting in '95/96.     Medication List: Current Outpatient Medications  Medication Sig Dispense Refill  . diphenhydrAMINE (BENADRYL) 25 MG tablet Take 25 mg by mouth daily as needed for itching.    Marland Kitchen acetaminophen (TYLENOL) 500 MG tablet Take 500 mg by mouth daily as needed (pain).    Marland Kitchen levocetirizine (XYZAL) 5 MG tablet Take 1 tablet (5 mg total) by mouth every evening. 30 tablet  5   No current facility-administered medications for this visit.    Known medication allergies: No Known Allergies   Physical examination: Blood pressure 130/70, pulse 68, temperature 97.8 F (36.6 C), temperature source Temporal, resp. rate 16, height 5\' 4"  (1.626 m), weight 216 lb (98 kg), SpO2 98 %.  General: Alert, interactive, in no acute distress. HEENT: PERRLA, TMs pearly gray, turbinates non-edematous without discharge, post-pharynx non erythematous. Neck: Supple without lymphadenopathy. Lungs: Clear to auscultation without wheezing, rhonchi or rales. {no increased work of breathing. CV: Normal S1, S2 without murmurs. Abdomen: Nondistended, nontender. Skin: Warm and dry, without lesions or rashes. Extremities:  No clubbing, cyanosis or edema. Neuro:   Grossly intact.  Diagnositics/Labs: Labs:  Component     Latest Ref Rng & Units 05/29/2019 07/28/2019  Sodium     135 - 145 mmol/L  139  Potassium     3.5 - 5.1 mmol/L  3.8  Chloride     98 - 111 mmol/L  105  CO2     22 - 32 mmol/L  27  Glucose     70 - 99 mg/dL  91  BUN     6 - 20 mg/dL  9  Creatinine     0.44 - 1.00 mg/dL  0.70  Calcium     8.9 - 10.3 mg/dL  8.9  Total Protein     6.5 - 8.1 g/dL  7.1  Albumin     3.5 - 5.0 g/dL  3.8  AST     15 - 41 U/L  29  ALT     0 - 44 U/L  27  Alkaline Phosphatase     38 - 126 U/L  71  Total Bilirubin     0.3 - 1.2 mg/dL  0.7  GFR, Est Non African American     >60 mL/min  >60  GFR, Est African American     >60 mL/min  >60  Anion gap     5 - 15  7  WBC     4.0 - 10.5 K/uL  6.0  RBC     3.87 - 5.11 MIL/uL  4.89  Hemoglobin     12.0 - 15.0 g/dL  11.5 (L)  HCT     36 - 46 %  39.5  MCV     80.0 - 100.0 fL  80.8  MCH     26.0 - 34.0 pg  23.5 (L)  MCHC     30.0 - 36.0 g/dL  29.1 (L)  RDW     11.5 - 15.5 %  20.1 (H)  Platelets     150 - 400 K/uL  313  nRBC     0.0 - 0.2 %  0.0  TSH  0.450 - 4.500 uIU/mL 0.315 (L)   T4,Free(Direct)     0.82 -  1.77 ng/dL 1.70    Allergy testing: environmental allergy skin prick testing is positive to [place], meadow fescue, hickory, curvularia, dust mite. Common food allergy skin prick testing is negative.  Allergy testing results were read and interpreted by provider, documented by clinical staff.   Assessment and plan:   Chronic pruritus Dermatographia ?Contact dermatitis  - episodic itching with no identifiable trigger at this time with development of hives with scratching.  Development of hives with pressure applied is called dermatographia.   - you have had good relief of symptoms with use of histamine thus this is a histaminergic process  - environmental allergy testing today is positive to [place] grass pollen, meadow fescue grass pollen, hickory tree pollen, curvularia mold, dust mite.  Allergen avoidance measures discussed/handouts provided  - food allergy testing today is negative  - would recommend taking a long-acting antihistamine like Zyrtec 10mg , Allegra 180mg  or Xyzal 5mg  daily to see if this prevent the episodes.  Take until your next appointment.    - can continue benadryl use as needed for breakthrough symptoms  - we discussed today contact dermatitis/contact allergy as well.  This is likely what is occurring with toilet paper.  Contact dermatitis is a delayed type allergy that does cause symptoms (typically itch and rash) that can occur quicker and quicker with exposure.  Contact dermatitis is best assess with patch testing.  We can perform the TRUE test patch test (see below for what it test for) if wanting to perform this type of test as well.  Patch testing is best placed on  Monday with return to office on Wednesday and Friday of same week for readings.  Once patches are in place they should not get wet.  You do not have to stop any medication for patch testing.    Follow-up: 3 months   I appreciate the opportunity to take part in [place] care. Please do not hesitate to  contact me with questions.  Sincerely,   Prudy Feeler, MD Allergy/Immunology [facility]

## 2019-10-18 NOTE — Patient Instructions (Signed)
Chronic itch  - episodic itching with no identifiable trigger at this time with development of hives with scratching.  Development of hives with pressure applied is called dermatographia.   - you have had good relief of symptoms with use of histamine thus this is a histaminergic process  - environmental allergy testing today is positive to [place] grass pollen, meadow fescue grass pollen, hickory tree pollen, curvularia mold, dust mite.  Allergen avoidance measures discussed/handouts provided  - food allergy testing today is negative  - would recommend taking a long-acting antihistamine like Zyrtec 10mg , Allegra 180mg  or Xyzal 5mg  daily to see if this prevent the episodes.  Take until your next appointment.    - can continue benadryl use as needed for breakthrough symptoms  - we discussed today contact dermatitis/contact allergy as well.  This is likely what is occurring with toilet paper.  Contact dermatitis is a delayed type allergy that does cause symptoms (typically itch and rash) that can occur quicker and quicker with exposure.  Contact dermatitis is best assess with patch testing.  We can perform the TRUE test patch test (see below for what it test for) if wanting to perform this type of test as well.  Patch testing is best placed on  Monday with return to office on Wednesday and Friday of same week for readings.  Once patches are in place they should not get wet.  You do not have to stop any medication for patch testing.    Follow-up: 3 months    True Test looks for the following sensitivities:

## 2019-10-25 ENCOUNTER — Ambulatory Visit
Admission: RE | Admit: 2019-10-25 | Discharge: 2019-10-25 | Disposition: A | Discharge status: Home / Self Care | Payer: Medicaid Other | Source: Ambulatory Visit | Attending: Obstetrics & Gynecology | Admitting: Obstetrics & Gynecology

## 2019-10-25 DIAGNOSIS — R102 Pelvic and perineal pain unspecified side: Secondary | ICD-10-CM

## 2019-10-25 DIAGNOSIS — D219 Benign neoplasm of connective and other soft tissue, unspecified: Secondary | ICD-10-CM

## 2019-10-25 DIAGNOSIS — D259 Leiomyoma of uterus, unspecified: Secondary | ICD-10-CM | POA: Diagnosis not present

## 2019-10-28 ENCOUNTER — Telehealth: Payer: Self-pay

## 2019-10-28 NOTE — Telephone Encounter (Signed)
-----   Message from Osborne Oman, MD sent at 10/25/2019  6:40 PM EDT ----- Small fibroids seen, smaller than previous ultrasound. No reason for increased pressure seen. Please inform patient of results.

## 2019-10-28 NOTE — Telephone Encounter (Signed)
Called pt to make aware of U/S results, no answer, left VM to call back for results.

## 2019-11-14 ENCOUNTER — Telehealth: Payer: Self-pay | Admitting: Obstetrics & Gynecology

## 2019-11-14 NOTE — Telephone Encounter (Signed)
      [facility] OB/GYN Physician Phone Call Documentation  I called  Chelsea Henson about her ultrasound results, done for pelvic pressure.  Unable to get patient on the phone, left her a detailed voice message.  Discussed that small fibroids (smaller than previous scans,  about 2 cm in size) were seen. This is not consistent with her reported pelvic pressure, no GYN etiology for her pelvic pressure seen.  She was advised to call office to make appointment if she wants to discuss this further.     Verita Schneiders, MD, [place] for [employer], [place]

## 2019-11-15 ENCOUNTER — Other Ambulatory Visit: Payer: Self-pay

## 2019-11-15 ENCOUNTER — Ambulatory Visit (INDEPENDENT_AMBULATORY_CARE_PROVIDER_SITE_OTHER): Payer: Medicaid Other | Admitting: Family Medicine

## 2019-11-15 ENCOUNTER — Encounter: Payer: Self-pay | Admitting: Family Medicine

## 2019-11-15 VITALS — BP 124/78 | HR 62 | Wt 211.8 lb

## 2019-11-15 DIAGNOSIS — F321 Major depressive disorder, single episode, moderate: Secondary | ICD-10-CM

## 2019-11-15 DIAGNOSIS — F419 Anxiety disorder, unspecified: Secondary | ICD-10-CM | POA: Diagnosis not present

## 2019-11-15 DIAGNOSIS — Z23 Encounter for immunization: Secondary | ICD-10-CM

## 2019-11-15 DIAGNOSIS — Z0000 Encounter for general adult medical examination without abnormal findings: Secondary | ICD-10-CM

## 2019-11-15 DIAGNOSIS — F32A Depression, unspecified: Secondary | ICD-10-CM

## 2019-11-15 DIAGNOSIS — R0789 Other chest pain: Secondary | ICD-10-CM

## 2019-11-15 DIAGNOSIS — F5102 Adjustment insomnia: Secondary | ICD-10-CM

## 2019-11-15 HISTORY — DX: Depression, unspecified: F32.A

## 2019-11-15 MED ORDER — FLUOXETINE HCL 20 MG PO TABS: 20.0000 mg | ORAL_TABLET | Freq: Every day | ORAL | 2 refills | Status: DC

## 2019-11-15 NOTE — Assessment & Plan Note (Signed)
Began after death of close friend 09/14/2019. Difficulty falling and staying asleep because of racing thoughts and inability to "shut mind off". Would rather not take medications if she can help it. Recommended extended-release melatonin and sleepy time tea with valerian root. Also discussed importance of sleep hygiene. Will follow up in one month.

## 2019-11-15 NOTE — Assessment & Plan Note (Addendum)
Most likely present for some time now, but exacerbated by death of a close friend September 27, 2019. Open to rx and therapy. Provided resources on finding a therapist or counselor who takes her insurance. Also started on Prozac 20 mg with instructions to titrate up to 40 mg in two weeks. Will follow up in one month.

## 2019-11-15 NOTE — Patient Instructions (Signed)
It was wonderful to meet you today. Thank you for allowing me to be a part of your care. Below is a short summary of what we discussed at your visit today:  Hepatits B vaccine and health maintenance Today we will give you your hepatitis B vaccine booster.  You only need 1.  Come back in 1 month for follow-up blood work to ensure you are immune.  Today we will also give you your Tdap booster, but she will only need once.  You will be due for another dose in 10 years.  Lastly, we are also giving you your flu shot today.  You may expect to feel under the weather for a couple days but this should improve as your body "revs up" your immune system and practices fighting the flu.  Depression and anxiety For therapy, you may go to https://www.psychologytoday.com/us to find resources in [place].  You may also use list below as a starting point to find a therapist or counselor.  Remember to try melatonin and or sleepy time tea at bedtime to help with your sleep.  We will start Prozac today.  I sent the prescription to pharmacy.  For the first 2 weeks, take just 1 tablet a day.  If you are feeling well and do not have any nausea, vomiting, or diarrhea you may increase the dose to 2 tablets daily.  These instructions are also on your medicine bottle.  Musculoskeletal chest pain Good news is that based on your symptoms, I believe this is musculoskeletal and not cardiac in nature.  This will most likely respond to over-the-counter Tylenol or ibuprofen.  Remember to do the stretches we talked about to "open up your chest".   If you have any questions or concerns, please do not hesitate to contact us via phone or MyChart message.   Ezequiel Essex, MD    Psychiatry Resource List (Adults and Children) Most of these providers will take Medicaid. please consult your insurance for a complete and updated list of available providers. When calling to make an appointment have your insurance information  available to confirm you are covered.   [facility]iatry and Counseling [address] [place]. [place], [place] [address] [phone]  [facility]  [place], [place]:   [facility]: [address]     [phone]   Linna Hoff: [place]. [place],        [phone] [place]: [place],    [place]: [address],                   [facility]: [place] [facility] [place]         [place]  (Psychiatry only; Adults /children 12 and over, will take Medicaid)  [place], [place], [place] [address]       [phone]   [facility] (Psychiatry & counseling ; adults & children ; will take Medicaid [address]  [address]  [place] [address]   Go on-line to complete referral ( [email or web address]/make-[email or web address] [phone]   (Spanish therapist)  [facility]  Psychiatry & counseling; Adults and children;  Call Registration prior to scheduling an appointment [phone] [address]    [place], [place] [address]    [phone]  [facility] (Psychiatry & counseling; adults & children; Medicare no Medicaid)  [place], [place]  [address]      [phone]    [facility] (up to age 73)  Psychiatry & counseling ,will take Medicaid, must do counseling to receive psychiatry services  [address] [place] [address]        ([place] (Psychiatry & counseling; adults & children; will take Medicaid) Will need a referral from provider [address],  [place], [place]  [phone]   [facility] --- Walk-In Mon-Friday 8am-3pm ( will take Medicaid, Psychiatry, Adults &  children,  [address] [place], [place]   [phone]   [facility]--, Walk-in M-F 8am-12pm and 1pm -3pm   (Counseling, Psychiatry, will take Medicaid, adults & children)  [address] [place], [place]  [phone]

## 2019-11-15 NOTE — Assessment & Plan Note (Addendum)
Hep B vaccine: Booster administered today. Will recheck titers in one month to check for immune response. If no immune response from Hep B booster, consider testing for HBsAg and hepatitis B core antigen (anti-HBc) to evaluate for undiagnosed chronic HBV infection, although unlikely.  TDaP: Boosterix administered today. Next due 2031.   Flu vaccine: Administered today. Next due 2022.

## 2019-11-15 NOTE — Progress Notes (Signed)
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 She is starting a new job soon. She can't easily obtain her vaccine records for work, specifically regarding the Hep B vaccine. 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 However, she had a workplace needlestick injury "a couple years ago" and subsequent lab work showed Hep B vaccine non-response. She wonders if she can get her titers tested today. Given her previously proven non-response, I recommended getting a Hep B booster dose today with follow up in one month to check her titers.   New onset depression and anxiety While she suspects she's had "underlying" depression and anxiety "for some time now", she has experienced an exacerbation of these symptoms since the death of a very close friend on 08/09/2019. She reports insomnia, anhedonia, intermittent tearfulness x 30 days, fatigue, "bad temper", racing thoughts, and inability to "shut [her] mind off at night". Feels like she is "walking in a bubble" and needs to "fake it" around her friends and family. Denies SI/HI. She prefers to take as few medications as possible. Also remembers taking an anti-depressant 16 years ago that "made [her] zonked out"; unfortunately, cannot remember the name of that medication. She is amenable to both therapy and an anti-depressant, but doesn't want to be "zonked out".   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 Cannot "shut off mind" at bed time. Difficulty with both falling and staying asleep. Has not tried anything for it yet.   Sternal chest pain Reports intermittent sternal aching and tenderness for several weeks. Worse with bending over and slouching at desk or table. Relived with laying down or sitting fully upright. No crushing chest pain, diaphoresis, jaw pain, pain radiation, intractable pyrosis.   Health care  maintenance As she was due for the seasonal flu vaccine, she elected to get it today. Also, she couldn't obtain records of her last TDaP booster for her new employer, so she also wanted to get that booster today.   PERTINENT  PMH / PSH: Pre-diabetes, tobacco use, h/o abnormal uterine bleeding, h/o recurrent vaginitis  OBJECTIVE:   BP 124/78   Pulse 62   Wt 211 lb 12.8 oz (96.1 kg)   SpO2 98%   BMI 36.36 kg/m    PHQ-9:  Depression screen [facility] 2/9 11/15/2019 05/29/2019 10/01/2018  Decreased Interest 0 1 0  Down, Depressed, Hopeless 0 1 0  PHQ - 2 Score 0 2 0  Altered sleeping 3 - -  Tired, decreased energy 2 - -  Change in appetite 0 - -  Feeling bad or failure about yourself  0 - -  Trouble concentrating 2 - -  Moving slowly or fidgety/restless 0 - -  Suicidal thoughts 0 - -  PHQ-9 Score 7 - -  Difficult doing work/chores Somewhat difficult - -     GAD-7: No flowsheet data found.    Physical Exam General: Awake, alert, oriented Chest wall: TTP over sternum, reproduces chest pain Cardiovascular: Regular rate and rhythm, S1 and S2 present, no murmurs auscultated Respiratory: Lung fields clear to auscultation bilaterally Extremities: No bilateral lower extremity edema, palpable pedal and pretibial pulses bilaterally Neuro: Cranial nerves II through X grossly intact, able to move all extremities spontaneously   ASSESSMENT/PLAN:   Health care maintenance Hep B vaccine: Booster administered today. Will recheck titers in one month to  check for immune response. If no immune response from Hep B booster, consider testing for HBsAg and hepatitis B core antigen (anti-HBc) to evaluate for undiagnosed chronic HBV infection, although unlikely.  TDaP: Boosterix administered today. Next due 2031.   Flu vaccine: Administered today. Next due 2022.   Anxiety and depression Most likely present for some time now, but exacerbated by death of a close friend 09-17-2019. Open to rx and therapy.  Provided resources on finding a therapist or counselor who takes her insurance. Also started on Prozac 20 mg with instructions to titrate up to 40 mg in two weeks. Will follow up in one month.    Insomnia due to psychological stress Began after death of close friend 09-17-19. Difficulty falling and staying asleep because of racing thoughts and inability to "shut mind off". Would rather not take medications if she can help it. Recommended extended-release melatonin and sleepy time tea with valerian root. Also discussed importance of sleep hygiene. Will follow up in one month.    Chest pain, musculoskeletal Describes intermittent achy tender sternum that is worse with bending over and poor posture at computer or table. TTP on exam. No red flag sxs such as crushing chest pain, n/v, intractable pyrosis, diaphoresis.  Attributable to musculoskeletal etiology. Follow up in one month.     Ezequiel Essex, MD [place]

## 2019-11-15 NOTE — Assessment & Plan Note (Signed)
Describes intermittent achy tender sternum that is worse with bending over and poor posture at computer or table. TTP on exam. No red flag sxs such as crushing chest pain, n/v, intractable pyrosis, diaphoresis.  Attributable to musculoskeletal etiology. Follow up in one month.

## 2019-11-28 DIAGNOSIS — R52 Pain, unspecified: Secondary | ICD-10-CM | POA: Diagnosis not present

## 2019-11-28 DIAGNOSIS — Z20822 Contact with and (suspected) exposure to covid-19: Secondary | ICD-10-CM | POA: Diagnosis not present

## 2019-11-28 DIAGNOSIS — R519 Headache, unspecified: Secondary | ICD-10-CM | POA: Diagnosis not present

## 2019-11-28 DIAGNOSIS — R432 Parageusia: Secondary | ICD-10-CM | POA: Diagnosis not present

## 2019-11-28 DIAGNOSIS — J069 Acute upper respiratory infection, unspecified: Secondary | ICD-10-CM | POA: Diagnosis not present

## 2019-12-27 ENCOUNTER — Ambulatory Visit: Payer: Medicaid Other | Admitting: Family Medicine

## 2019-12-29 DIAGNOSIS — H5213 Myopia, bilateral: Secondary | ICD-10-CM | POA: Diagnosis not present

## 2020-01-13 ENCOUNTER — Ambulatory Visit: Payer: Medicaid Other | Admitting: Family

## 2020-01-15 ENCOUNTER — Encounter: Payer: Medicaid Other | Admitting: Allergy

## 2020-01-17 ENCOUNTER — Encounter: Payer: Medicaid Other | Admitting: Allergy

## 2020-02-03 ENCOUNTER — Other Ambulatory Visit: Payer: Self-pay

## 2020-02-03 ENCOUNTER — Other Ambulatory Visit (HOSPITAL_COMMUNITY)
Admission: RE | Admit: 2020-02-03 | Discharge: 2020-02-03 | Disposition: A | Discharge status: Home / Self Care | Payer: Medicaid Other | Source: Ambulatory Visit | Attending: Obstetrics & Gynecology | Admitting: Obstetrics & Gynecology

## 2020-02-03 ENCOUNTER — Ambulatory Visit (INDEPENDENT_AMBULATORY_CARE_PROVIDER_SITE_OTHER): Payer: Medicaid Other | Admitting: *Deleted

## 2020-02-03 VITALS — BP 142/91 | HR 60

## 2020-02-03 DIAGNOSIS — N760 Acute vaginitis: Secondary | ICD-10-CM | POA: Diagnosis not present

## 2020-02-03 NOTE — Progress Notes (Signed)
SUBJECTIVE:  44 y.o. female complains of malodorous vaginal discharge for  1 week(s). Denies abnormal vaginal bleeding or significant pelvic pain or fever. No UTI symptoms. Denies history of known exposure to STD.  No LMP recorded. Patient is premenopausal.  OBJECTIVE:  She appears well, afebrile. Urine dipstick: not done.  ASSESSMENT:  Vaginal Discharge  Vaginal Odor   PLAN:  GC, chlamydia, trichomonas, BVAG, CVAG probe sent to lab. Treatment: To be determined once lab results are received ROV prn if symptoms persist or worsen.

## 2020-02-03 NOTE — Progress Notes (Signed)
Patient was assessed and managed by nursing staff during this encounter. I have reviewed the chart and agree with the documentation and plan. I have also made any necessary editorial changes.  Jaynie Collins, MD 02/03/2020 2:37 PM

## 2020-02-04 LAB — CERVICOVAGINAL ANCILLARY ONLY
Bacterial Vaginitis (gardnerella): NEGATIVE
Candida Glabrata: NEGATIVE
Candida Vaginitis: NEGATIVE
Chlamydia: NEGATIVE
Comment: NEGATIVE
Comment: NEGATIVE
Comment: NEGATIVE
Comment: NEGATIVE
Comment: NEGATIVE
Comment: NORMAL
Neisseria Gonorrhea: NEGATIVE
Trichomonas: NEGATIVE

## 2020-02-21 ENCOUNTER — Other Ambulatory Visit: Payer: Self-pay

## 2020-02-21 ENCOUNTER — Ambulatory Visit (INDEPENDENT_AMBULATORY_CARE_PROVIDER_SITE_OTHER): Payer: Medicaid Other | Admitting: Family Medicine

## 2020-02-21 ENCOUNTER — Encounter: Payer: Self-pay | Admitting: Family Medicine

## 2020-02-21 VITALS — BP 118/74 | HR 76 | Ht 64.0 in | Wt 214.4 lb

## 2020-02-21 DIAGNOSIS — R7303 Prediabetes: Secondary | ICD-10-CM

## 2020-02-21 DIAGNOSIS — K929 Disease of digestive system, unspecified: Secondary | ICD-10-CM | POA: Diagnosis present

## 2020-02-21 LAB — POCT GLYCOSYLATED HEMOGLOBIN (HGB A1C): Hemoglobin A1C: 6 % — AB (ref 4.0–5.6)

## 2020-02-21 NOTE — Assessment & Plan Note (Signed)
The patient's GI symptoms could be due to stress causing her gut issues, could also consider medication side effect is causing the same.  Patient is not currently taking a probiotic.  No history of bloody stools.  Patient given the following instructions: 1. Get a probiotic - Culturelle, FloraStor, Align, etc. Aim for 6-10 billion units. Take this daily. You want to take in Lactobacillus.  2. Also incorporate [place] yoghurt, Sauerkraut, Kimchi and Kombucha into the diet.  3. Follow up as needed if the gut issues persist.

## 2020-02-21 NOTE — Patient Instructions (Signed)
Thank you for coming in to see [place] today! Please see below to review our plan for today's visit:  1. Get a probiotic - Culturelle, FloraStor, Align, etc. Aim for 6-10 billion units. Take this daily. You want to take in Lactobacillus.  2. Also incorporate [place] yoghurt, Sauerkraut, [place] and [place].  3. Follow up as needed if the gut issues persist.   Please call the clinic at [phone] if your symptoms worsen or you have any concerns. It was our pleasure to serve you!   Dr. Milus Banister [facility]   Vegetarian Eating Information Many people may prefer vegetarian eating for religious, environmental, or personal reasons. These diets are often lower in calories, salt, sugar, cholesterol, and saturated and trans fats. Vegetarian eating provides significant health benefits. People who eat a vegetarian diet often have lower rates of:  Obesity.  Diabetes.  Breast and colon cancers.  Cardiovascular and gallbladder diseases. What are the types of vegetarian eating? Vegetarian eating includes dietary choices that focus on eating mostly vegetables and fruit, grains, beans, nuts, and seeds. There are several different types of vegetarian eating. Talk with a diet and nutrition specialist (dietitian) about what type of vegetarian diet is best for you. Lacto-ovo vegetarian  Recommended foods: fruits and vegetables, milk and dairy, eggs, grains, soy and vegetable protein, beans, nuts, and seeds.  Foods to avoid: meat, poultry, seafood, animal-based broths and gravies, and gelatin. Lacto-vegetarian  Recommended foods: fruits and vegetables, milk and dairy, grains, soy and vegetable protein, beans, nuts, and seeds.  Foods to avoid: meat, poultry, seafood, animal-based broths and gravies, gelatin, and eggs. Vegan  Recommended foods: fruits and vegetables, grains, soy and vegetable protein, beans, nuts, and seeds.  Foods to avoid: meat, poultry, seafood, animal-based  broths and gravies, gelatin, eggs, milk and dairy, and honey.   What do I need to know about vegetarian eating? All vegetarian diets restrict proteins that come from animals. Foods that come from animals have important nutrients, such as protein, fats, vitamins, and minerals. It is important to get these nutrients from other types of foods. If you think you may not be getting the right nutrients, or if you do not eat any animal products, talk with your health care provider or dietitian about taking supplements. A dietitian can help determine your individual nutrient needs. What are tips for following this plan? Eat a diet that includes a variety of fruits, vegetables, whole grains, and protein sources. This is important to make sure you get enough of the following nutrients: Protein Healthy protein sources include:  Eggs, milk, and cheese. Soy products. Tofu, tempeh, and textured vegetable protein (TVP). Quinoa. Hemp seeds. Other protein sources include:  Beans, such as black beans or kidney beans. Other legumes, such as lentils and split peas. Nuts, such as almonds, [place] nuts, and pecans. Seeds, such as sunflower seeds. To get the most benefit from plant-based proteins, combine two or more sources of plant protein with whole grains in one dish. Examples include beans and rice, almond butter on bread, or sunflower seeds on noodles. Vitamin B12 Sources of vitamin B12 include:  Cheese and eggs. Breakfast cereals and other prepared products that have vitamin B12 added (fortified products). If you eat a vegan diet, ask your health care provider or dietitian about taking a B12 supplement. Vitamin D Good sources of vitamin D include:  Egg yolks. Fortified dairy products. Fortified orange juice. Mushrooms. Cereals with added vitamin D. Another way of getting vitamin D is  to spend 10 minutes each day in the sun. This helps your body make its own vitamin D. Depending on your age, you may need to  take a vitamin D supplement. Talk with your health care provider or dietitian about how much vitamin D you need in a supplement. Iron Healthy sources of iron include:  Dark, leafy greens. Nuts. Beans. Grain products that are fortified with iron, such as cereals. Tofu, tempeh, soybeans, and quinoa. To get the most iron from plant-based foods:  Eat iron-containing plant-based foods with vitamin C. For example, squeeze fresh lemon juice over cooked greens like kale, chard, or spinach, or have a glass of orange juice with your meals.  Avoid eating dairy products, coffee, or tea with iron-containing foods. Omega-3 fatty acids Good sources of omega-3 fatty acids include:  Walnuts. Flax seeds, canola oil, soybean oil, and tofu. Avocados. Olives and olive oil. Foods with added omega-3 fatty acids, such as eggs, milk, and juices. Calcium Good sources of calcium include:  Dairy products. Fortified non-dairy milk. Fortified tofu. Dark, leafy greens, such as kale, bok choy, Chinese cabbage, collard greens and mustard greens. Broccoli. Okra. Fortified breakfast cereals and fruit juices. Figs. Zinc Good sources of zinc include:  Pumpkin seeds. Legumes, such as chickpeas, kidney beans, and green peas. Wheat germ, whole grains, and fortified cereals. Mushrooms. Spinach and kale. Milk and dairy foods. Dark chocolate. Summary  Vegetarian eating is a choice made by people who prefer vegetarian eating for religious, environmental, or personal reasons. These diets can provide significant health benefits.  There are several types of vegetarian diets, but all restrict proteins that come from animals.  It is important to make sure that you are getting enough nutrients, including protein, vitamin B12, vitamin D, iron, omega-3 fatty acids, calcium, and zinc from your diet.  If you think you are not getting the right nutrients or if you do not eat any animal products, talk with your health care provider or  dietitian. This information is not intended to replace advice given to you by your health care provider. Make sure you discuss any questions you have with your health care provider. Document Revised: 03/15/2016 Document Reviewed: 03/29/2016 Elsevier Patient Education  2021 [employer].

## 2020-02-21 NOTE — Progress Notes (Signed)
    SUBJECTIVE:   CHIEF COMPLAINT / HPI:   Digestive issues: This is a very pleasant patient reporting to clinic with concerns for digestive issues.  She reports that she occasionally has a sensitive stomach with gurgling and gassiness, feels like "bubble gut".  She says that when she experiences that she also feels as if her vaginal area is also "off", says it feels clammy and uncomfortable.  She has a history of recurrent BV, however was recently tested for BV and this was negative, other STIs were negative as well. The patient reports that she stools every other day, denies any pitch black stools or blood in her stools.  She has never had a colonoscopy Voiding normally  Health maintenance: Patient would benefit from COVID booster shot  PERTINENT  PMH / PSH:  Anxiety, depression, prediabetes history of iron deficiency anemia, stress   OBJECTIVE:   BP 118/74   Pulse 76   Ht 5\' 4"  (1.626 m)   Wt 214 lb 6.4 oz (97.3 kg)   SpO2 96%   BMI 36.80 kg/m    Physical exam: General: Well-appearing patient, no apparent distress Respiratory:CTA bilaterally, comfortable work of breathing Cardio: RRR, S1-S2 present, no murmurs appreciated Abdomen: Normal bowel sounds appreciated, no stool burden appreciated to palpation   ASSESSMENT/PLAN:   Digestive problems The patient's GI symptoms could be due to stress causing her gut issues, could also consider medication side effect is causing the same.  Patient is not currently taking a probiotic.  No history of bloody stools.  Patient given the following instructions: 1. Get a probiotic - Culturelle, FloraStor, Align, etc. Aim for 6-10 billion units. Take this daily. You want to take in Lactobacillus.  2. Also incorporate [place] yoghurt, Sauerkraut, Kimchi and Kombucha into the diet.  3. Follow up as needed if the gut issues persist.      Chelsea Henson

## 2020-02-22 DIAGNOSIS — Z1152 Encounter for screening for COVID-19: Secondary | ICD-10-CM | POA: Diagnosis not present

## 2020-02-28 ENCOUNTER — Other Ambulatory Visit (HOSPITAL_COMMUNITY)
Admission: RE | Admit: 2020-02-28 | Discharge: 2020-02-28 | Disposition: A | Discharge status: Home / Self Care | Payer: Medicaid Other | Source: Ambulatory Visit | Attending: Obstetrics & Gynecology | Admitting: Obstetrics & Gynecology

## 2020-02-28 ENCOUNTER — Ambulatory Visit (INDEPENDENT_AMBULATORY_CARE_PROVIDER_SITE_OTHER): Payer: Medicaid Other | Admitting: Nurse Practitioner

## 2020-02-28 ENCOUNTER — Other Ambulatory Visit: Payer: Self-pay

## 2020-02-28 ENCOUNTER — Encounter: Payer: Self-pay | Admitting: Nurse Practitioner

## 2020-02-28 VITALS — BP 133/87 | HR 63 | Wt 217.0 lb

## 2020-02-28 DIAGNOSIS — N760 Acute vaginitis: Secondary | ICD-10-CM | POA: Diagnosis not present

## 2020-02-28 DIAGNOSIS — N951 Menopausal and female climacteric states: Secondary | ICD-10-CM | POA: Diagnosis not present

## 2020-02-28 DIAGNOSIS — Z113 Encounter for screening for infections with a predominantly sexual mode of transmission: Secondary | ICD-10-CM | POA: Diagnosis not present

## 2020-02-28 MED ORDER — NORETHIN ACE-ETH ESTRAD-FE 1-20 MG-MCG(24) PO TABS: 1.0000 | ORAL_TABLET | Freq: Every day | ORAL | 11 refills | Status: DC

## 2020-02-28 NOTE — Patient Instructions (Addendum)
Follow up in one month for blood pressure check and follow up of hot flashes. Return sooner for any problems. If you are still smoking try to stop.   Menopause and Hormone Replacement Therapy Menopause is a normal time of life when menstrual periods stop completely and the ovaries stop producing the female hormones estrogen and progesterone. Low levels of these hormones can affect your health and cause symptoms. Hormone replacement therapy (HRT) can relieve some of those symptoms. HRT is the use of artificial (synthetic) hormones to replace hormones that your body has stopped producing because you have reached menopause. Types of HRT HRT may consist of the synthetic hormones estrogen and progestin, or it may consist of estrogen-only therapy. You and your health care provider will decide which form of HRT is best for you. If you choose to be on HRT and you have a uterus, estrogen and progestin are usually prescribed. Estrogen-only therapy is used for women who do not have a uterus. Possible options for taking HRT include:  Pills.  Patches.  Gels.  Sprays.  Vaginal cream.  Vaginal rings.  Vaginal inserts. The amount of hormones that you take and how long you take them varies according to your health. It is important to:  Begin HRT with the lowest possible dosage.  Stop HRT as soon as your health care provider tells you to stop.  Work with your health care provider so that you feel informed and comfortable with your decisions.   Tell a health care provider about:  Any allergies you have.  Whether you have had blood clots or know of any risk factors you may have for blood clots.  Whether you or family members have had cancer, especially cancer of the breasts, ovaries, or uterus.  Any surgeries you have had.  All medicines you are taking, including vitamins, herbs, eye drops, creams, and over-the-counter medicines.  Whether you are pregnant or may be pregnant.  Any medical  conditions you have. What are the benefits? HRT can reduce the frequency and severity of menopausal symptoms. Benefits of HRT vary according to the kind of symptoms that you have, how severe they are, and your overall health. HRT may help to improve the following symptoms of menopause:  Hot flashes and night sweats. These are sudden feelings of heat that spread over the face and body. The skin may turn red, like a blush. Night sweats are hot flashes that happen while you are sleeping or trying to sleep.  Bone loss (osteoporosis). The body loses calcium more quickly after menopause, causing the bones to become weaker. This can increase the risk for bone breaks (fractures).  Vaginal dryness. The lining of the vagina can become thin and dry, which can cause pain during sex or cause infection, burning, or itching.  Urinary tract infections.  Urinary incontinence. This is the inability to control when you urinate.  Irritability.  Short-term memory problems. What are the risks? Risks of HRT vary depending on your individual health and medical history. Risks of HRT also depend on whether you receive both estrogen and progestin or you receive estrogen only. HRT may increase the risk of:  Spotting. This is when a small amount of blood leaks from the vagina unexpectedly.  Endometrial cancer. This cancer is in the lining of the uterus (endometrium).  Breast cancer.  Increased density of breast tissue. This can make it harder to find breast cancer on a breast X-ray (mammogram).  Stroke.  Heart disease.  Blood clots.  Gallbladder  disease or liver disease. Risks of HRT can increase if you have any of the following conditions:  Endometrial cancer.  Liver disease.  Heart disease.  Breast cancer.  History of blood clots.  History of stroke. Follow these instructions at home: Pap tests  Have Pap tests done as often as told by your health care provider. A Pap test is sometimes called  a Pap smear. It is a screening test that is used to check for signs of cancer of the cervix and vagina. A Pap test can also identify the presence of infection or precancerous changes. Pap tests may be done: ? Every 3 years, starting at age 15. ? Every 5 years, starting after age 13, in combination with testing for human papillomavirus (HPV). ? More often or less often depending on other medical conditions you have, your age, and other risk factors.  It is up to you to get the results of your Pap test. Ask your health care provider, or the department that is doing the test, when your results will be ready. General instructions  Take over-the-counter and prescription medicines only as told by your health care provider.  Do not use any products that contain nicotine or tobacco. These products include cigarettes, chewing tobacco, and vaping devices, such as e-cigarettes. If you need help quitting, ask your health care provider.  Get mammograms, pelvic exams, and medical checkups as often as told by your health care provider.  Keep all follow-up visits. This is important. Contact a health care provider if you have:  Pain or swelling in your legs.  Lumps or changes in your breasts or armpits.  Pain, burning, or bleeding when you urinate.  Unusual vaginal bleeding.  Dizziness or headaches.  Pain in your abdomen. Get help right away if you have:  Shortness of breath.  Chest pain.  Slurred speech.  Weakness or numbness in any part of your arms or legs. These symptoms may represent a serious problem that is an emergency. Do not wait to see if the symptoms will go away. Get medical help right away. Call your local emergency services (911 in the [place] Do not drive yourself to the hospital. Summary  Menopause is a normal time of life when menstrual periods stop completely and the ovaries stop producing the female hormones estrogen and progesterone.  HRT can reduce the frequency and  severity of menopausal symptoms.  Risks of HRT vary depending on your individual health and medical history. This information is not intended to replace advice given to you by your health care provider. Make sure you discuss any questions you have with your health care provider. Document Revised: 07/29/2019 Document Reviewed: 07/29/2019 Elsevier Patient Education  2021 [place], 44(Suppl 1), [id number]. https://[email or web address]://[email or web address]/[email or web address]-[email or web address]">  Prediabetes Prediabetes is when your blood sugar (blood glucose) level is higher than normal but not high enough for you to be diagnosed with type 2 diabetes. Having prediabetes puts you at risk for developing type 2 diabetes (type 2 diabetes mellitus). With certain lifestyle changes, you may be able to prevent or delay the onset of type 2 diabetes. This is important because type 2 diabetes can lead to serious complications, such as:  Heart disease.  Stroke.  Blindness.  Kidney disease.  Depression.  Poor circulation in the feet and legs. In severe cases, this could lead to surgical removal of a leg (amputation). What are the causes? The exact cause of prediabetes is not known. It may result from insulin resistance. Insulin resistance  develops when cells in the body do not respond properly to insulin that the body makes. This can cause excess glucose to build up in the blood. High blood glucose (hyperglycemia) can develop. What increases the risk? The following factors may make you more likely to develop this condition:  You have a family member with type 2 diabetes.  You are older than 45 years.  You had a temporary form of diabetes during a pregnancy (gestational diabetes).  You had polycystic ovary syndrome (PCOS).  You are overweight or obese.  You are inactive (sedentary).  You have a history of heart disease, including problems with cholesterol levels, high levels of blood fats, or high blood  pressure. What are the signs or symptoms? You may have no symptoms. If you do have symptoms, they may include:  Increased hunger.  Increased thirst.  Increased urination.  Vision changes, such as blurry vision.  Tiredness (fatigue). How is this diagnosed? This condition can be diagnosed with blood tests. Your blood glucose may be checked with one or more of the following tests:  A fasting blood glucose (FBG) test. You will not be allowed to eat (you will fast) for at least 8 hours before a blood sample is taken.  An A1C blood test (hemoglobin A1C). This test provides information about blood glucose levels over the previous 2?3 months.  An oral glucose tolerance test (OGTT). This test measures your blood glucose at two points in time: ? After fasting. This is your baseline level. ? Two hours after you drink a beverage that contains glucose. You may be diagnosed with prediabetes if:  Your FBG is 100?125 mg/dL (5.6-6.9 mmol/L).  Your A1C level is 5.7?6.4% (39-46 mmol/mol).  Your OGTT result is 140?199 mg/dL (7.8-11 mmol/L). These blood tests may be repeated to confirm your diagnosis.   How is this treated? Treatment may include dietary and lifestyle changes to help lower your blood glucose and prevent type 2 diabetes from developing. In some cases, medicine may be prescribed to help lower the risk of type 2 diabetes. Follow these instructions at home: Nutrition  Follow a healthy meal plan. This includes eating lean proteins, whole grains, legumes, fresh fruits and vegetables, low-fat dairy products, and healthy fats.  Follow instructions from your health care provider about eating or drinking restrictions.  Meet with a dietitian to create a healthy eating plan that is right for you.   Lifestyle  Do moderate-intensity exercise for at least 30 minutes a day on 5 or more days each week, or as told by your health care provider. A mix of activities may be best, such as: ? Brisk  walking, swimming, biking, and weight lifting.  Lose weight as told by your health care provider. Losing 5-7% of your body weight can reverse insulin resistance.  Do not drink alcohol if: ? Your health care provider tells you not to drink. ? You are pregnant, may be pregnant, or are planning to become pregnant.  If you drink alcohol: ? Limit how much you use to:  0-1 drink a day for women.  0-2 drinks a day for men. ? Be aware of how much alcohol is in your drink. In the [place] one drink equals one 12 oz bottle of beer (355 mL), one 5 oz glass of wine (148 mL), or one 1 oz glass of hard liquor (44 mL). General instructions  Take over-the-counter and prescription medicines only as told by your health care provider. You may be prescribed medicines that help  lower the risk of type 2 diabetes.  Do not use any products that contain nicotine or tobacco, such as cigarettes, e-cigarettes, and chewing tobacco. If you need help quitting, ask your health care provider.  Keep all follow-up visits. This is important. Where to find more information  American Diabetes Association: www.diabetes.org  Academy of Nutrition and Dietetics: www.eatright.org  American Heart Association: www.heart.org Contact a health care provider if:  You have any of these symptoms: ? Increased hunger. ? Increased urination. ? Increased thirst. ? Fatigue. ? Vision changes, such as blurry vision. Get help right away if you:  Have shortness of breath.  Feel confused.  Vomit or feel like you may vomit. Summary  Prediabetes is when your blood sugar (blood glucose)level is higher than normal but not high enough for you to be diagnosed with type 2 diabetes.  Having prediabetes puts you at risk for developing type 2 diabetes (type 2 diabetes mellitus).  Make lifestyle changes such as eating a healthy diet and exercising regularly to help prevent diabetes. Lose weight as told by your health care provider. This  information is not intended to replace advice given to you by your health care provider. Make sure you discuss any questions you have with your health care provider. Document Revised: 04/25/2019 Document Reviewed: 04/25/2019 Elsevier Patient Education  2021 [place] [employer] [place], [phone]), [id number]. [email or web address]  Health Risks of Smoking Smoking tobacco is very bad for your health. Tobacco smoke contains many toxic chemicals that can damage every part of your body. Secondhand smoke can be harmful to those around you. Tobacco or nicotine use can cause many long-term (chronic) diseases. Smoking is difficult to quit because a chemical in tobacco, called nicotine, causes addiction or dependence. When you smoke and inhale, nicotine is absorbed quickly into the bloodstream through your lungs. Both inhaled and non-inhaled nicotine may be addictive. How can quitting affect me? There are health benefits of quitting smoking. Some benefits happen right away and others take time. Benefits may include:  Blood flow, blood pressure, heart rate, and lung capacity may begin to improve. However, any lung damage that has already occurred cannot be repaired.  Temporary respiratory symptoms, such as nasal congestion and cough, may improve over time.  Your risk of heart disease, stroke, and cancer is reduced.  The overall quality of your health may improve.  You may save money, as you will not spend money on tobacco products and may spend less money on smoking-related health issues. What can increase my risk? Smoking harms nearly every organ in the body. People who smoke tobacco have a shorter life expectancy and an increased risk of many serious medical problems. These include:  More respiratory infections, such as colds and pneumonia.  Cancer.  Heart disease.  Stroke.  Chronic respiratory diseases.  Delayed wound healing and  increased risk of complications during surgery.  Problems with reproduction, pregnancy, and childbirth, such as infertility, early (premature) births, stillbirths, and birth defects. Secondhand smoke exposure to children increases the risk of:  Sudden infant death syndrome (SIDS).  Infections in the nose, throat, or airways (respiratory infections).  Chronic respiratory symptoms.   What actions can I take to quit? Smoking is an addiction that affects both your body and your mind, and long-time habits can be hard to change. Your health care provider can recommend:  Nicotine replacement products, such as patches, gum, and nasal sprays. Use these products only as directed. Do not replace cigarette  smoking with electronic cigarettes, which are commonly called e-cigarettes. The safety of e-cigarettes is not known, and some may contain harmful chemicals.  Programs and community resources, which may include group support, education, or talk therapy.  Prescription medicines to help reduce cravings.  A combination of two or more quit methods, which will increase the success of quitting.   Where to find support Follow the recommendations from your health care provider about support groups and other assistance. You can also visit:  [employer]: www.naquitline.org or call 1-800-QUIT-NOW.  [place] Department of Health and Human Services: www.smokefree.gov  American Lung Association: www.freedomfromsmoking.org  American Heart Association: www.heart.org Where to find more information  Centers for Disease Control and Prevention: http://www.wolf.info/  World Health Organization: RoleLink.com.br Summary  Smoking tobacco is very bad for your health. Tobacco smoke contains many toxic chemicals that can damage every part of the body.  Smoking is difficult to quit because a chemical in tobacco, called nicotine, causes addiction or dependence.  There are immediate and long-term health  benefits of quitting smoking.  A combination of two or more quit methods increases the success of quitting. This information is not intended to replace advice given to you by your health care provider. Make sure you discuss any questions you have with your health care provider. Document Revised: 03/11/2019 Document Reviewed: 03/11/2019 Elsevier Patient Education  2021 [employer].

## 2020-02-28 NOTE — Progress Notes (Signed)
GYNECOLOGY ANNUAL PREVENTATIVE CARE ENCOUNTER NOTE  History:     Chelsea Henson is a 45 y.o. No obstetric history on file. female here with concern about vaginal d/c with odor.  Current complaints: vaginal d/c, odor. She describes the odor as a fishy, metallic smell. She reports hot flashes with sweats. Denies abnormal vaginal bleeding or pelvic pain. She does report that occasionally she notes more d/c and sometimes burning after intercourse. Patient reports being her a few weeks ago and obtaining a self swab that resulted as negative for all tests done. Patient reports symptoms have worsened and would like provider to do pelvic exam to collect specimens and test for "everything".    Gynecologic History No LMP recorded. Patient is premenopausal. LMP 05/01/19 prior to ablation  Last Pap: 02/21/19 Results were: normal with negative HPV.  Obstetric History OB History  No obstetric history on file.    Past Medical History:  Diagnosis Date  . Abnormal uterine bleeding   . Bacterial vaginosis   . Enlarged thyroid   . Fibroids   . Prediabetes   . Seasonal allergies   . Tobacco abuse     Past Surgical History:  Procedure Laterality Date  . CESAREAN SECTION    . DILATATION & CURETTAGE/HYSTEROSCOPY WITH MYOSURE N/A 05/22/2019   Procedure: DILATATION & CURETTAGE/HYSTEROSCOPY WITH MYOSURE;  Surgeon: Osborne Oman, MD;  Location: [place];  Service: Gynecology;  Laterality: N/A;  . HYSTEROSCOPY N/A 05/22/2019   Procedure: Hysteroscopy with Hydrothermal Ablation ;  Surgeon: Osborne Oman, MD;  Location: [place];  Service: Gynecology;  Laterality: N/A;  . TUBAL LIGATION      Current Outpatient Medications on File Prior to Visit  Medication Sig Dispense Refill  . acetaminophen (TYLENOL) 500 MG tablet Take 500 mg by mouth daily as needed (pain).    Marland Kitchen diphenhydrAMINE (BENADRYL) 25 MG tablet Take 25 mg by mouth daily as needed for itching.    Marland Kitchen  FLUoxetine (PROZAC) 20 MG tablet Take 1 tablet (20 mg total) by mouth daily. 1st two weeks: Take one tablet (20mg ) daily After two weeks: Take two tablets (40mg ) daily 42 tablet 2  . levocetirizine (XYZAL) 5 MG tablet Take 1 tablet (5 mg total) by mouth every evening. 30 tablet 5   No current facility-administered medications on file prior to visit.    No Known Allergies  Social History:  reports that she has been smoking cigarettes. She has been smoking about 0.25 packs per day. She has never used smokeless tobacco. She reports current alcohol use. She reports current drug use. Drug: Marijuana.  Family History  Problem Relation Age of Onset  . Hypertension Mother   . Diabetes Sister   . Depression Paternal Grandmother   . Allergic rhinitis Neg Hx   . Angioedema Neg Hx   . Asthma Neg Hx   . Eczema Neg Hx   . Immunodeficiency Neg Hx   . Urticaria Neg Hx     The following portions of the patient's history were reviewed and updated as appropriate: allergies, current medications, past family history, past medical history, past social history, past surgical history and problem list.  Review of Systems Pertinent items noted in HPI and remainder of comprehensive ROS otherwise negative.  Physical Exam:  BP 133/87   Pulse 63   Wt 217 lb (98.4 kg)   BMI 37.25 kg/m  CONSTITUTIONAL: Obese  female in no acute distress.  HENT:  Normocephalic, atraumatic, External right  and left ear normal.  SKIN: Skin is warm and dry. No rash noted. Not diaphoretic. No erythema. No pallor. MUSCULOSKELETAL: Normal range of motion. No tenderness.   NEUROLOGIC: Alert and oriented to person, place, and time. Marland Kitchen  PSYCHIATRIC: Normal mood and affect. Normal behavior. Normal judgment and thought content. RESPIRATORY:  no problems with respiration noted.Marland Kitchen  PELVIC: Normal appearing external genitalia and urethral meatus; normal appearing vaginal mucosa. There is a moderate amount of white vaginal d/c noted.    Normal uterine size, no other palpable masses, no uterine or adnexal tenderness.  Performed in the presence of a chaperone.   Assessment and Plan:    1. Recurrent vaginitis - Cervicovaginal ancillary only( [place])  2. Routine screening for STI (sexually transmitted infection) - Hepatitis B surface antigen - Hepatitis C antibody - HIV Antibody (routine testing w rflx) - RPR  3. Menopausal symptom Rx Estradiol Will follow up results of blood work and cultures and manage accordingly.  Please refer to After Visit Summary for other counseling recommendations.    [address] RN, FNP, [facility]

## 2020-02-29 LAB — HEPATITIS C ANTIBODY: Hep C Virus Ab: <0.1 s/co ratio (ref 0.0–0.9)

## 2020-02-29 LAB — HIV ANTIBODY (ROUTINE TESTING W REFLEX): HIV Screen 4th Generation wRfx: NONREACTIVE

## 2020-02-29 LAB — RPR: RPR Ser Ql: NONREACTIVE

## 2020-02-29 LAB — HEPATITIS B SURFACE ANTIGEN: Hepatitis B Surface Ag: NEGATIVE

## 2020-03-02 LAB — CERVICOVAGINAL ANCILLARY ONLY
Bacterial Vaginitis (gardnerella): NEGATIVE
Candida Glabrata: NEGATIVE
Candida Vaginitis: NEGATIVE
Chlamydia: NEGATIVE
Comment: NEGATIVE
Comment: NEGATIVE
Comment: NEGATIVE
Comment: NEGATIVE
Comment: NEGATIVE
Comment: NORMAL
Neisseria Gonorrhea: NEGATIVE
Trichomonas: NEGATIVE

## 2020-03-06 DIAGNOSIS — R52 Pain, unspecified: Secondary | ICD-10-CM | POA: Diagnosis not present

## 2020-03-06 DIAGNOSIS — Z20822 Contact with and (suspected) exposure to covid-19: Secondary | ICD-10-CM | POA: Diagnosis not present

## 2020-03-24 ENCOUNTER — Telehealth: Payer: Self-pay

## 2020-03-24 NOTE — Telephone Encounter (Signed)
Pt called requesting lab results from 02/28/2020 visit in office. Advised pt all lab results are normal. Pt verbalizes understanding at this time.

## 2020-03-26 NOTE — Progress Notes (Signed)
   Subjective:    Patient ID: Chelsea Henson, female    DOB: [birth year], 45 y.o.   MRN: [id number]  HPI Chelsea Henson is a 45 y.o. female who presents to [facility] at [facility] for recheck of BP and to discuss treatment for menoaupasal symptoms. Patient reports that she did not start taking the pills because she was afraid of the side effects. She wants to try OTC medications first.   Past Medical History:  Diagnosis Date  . Abnormal uterine bleeding   . Bacterial vaginosis   . Enlarged thyroid   . Fibroids   . Prediabetes   . Seasonal allergies   . Tobacco abuse    Past Surgical History:  Procedure Laterality Date  . CESAREAN SECTION    . DILATATION & CURETTAGE/HYSTEROSCOPY WITH MYOSURE N/A 05/22/2019   Procedure: DILATATION & CURETTAGE/HYSTEROSCOPY WITH MYOSURE;  Surgeon: Osborne Oman, MD;  Location: [place];  Service: Gynecology;  Laterality: N/A;  . HYSTEROSCOPY N/A 05/22/2019   Procedure: Hysteroscopy with Hydrothermal Ablation ;  Surgeon: Osborne Oman, MD;  Location: [place];  Service: Gynecology;  Laterality: N/A;  . TUBAL LIGATION     Current Outpatient Medications on File Prior to Visit  Medication Sig Dispense Refill  . acetaminophen (TYLENOL) 500 MG tablet Take 500 mg by mouth daily as needed (pain).    Marland Kitchen diphenhydrAMINE (BENADRYL) 25 MG tablet Take 25 mg by mouth daily as needed for itching.    Marland Kitchen FLUoxetine (PROZAC) 20 MG tablet Take 1 tablet (20 mg total) by mouth daily. 1st two weeks: Take one tablet (20mg ) daily After two weeks: Take two tablets (40mg ) daily 42 tablet 2  . levocetirizine (XYZAL) 5 MG tablet Take 1 tablet (5 mg total) by mouth every evening. 30 tablet 5   No current facility-administered medications on file prior to visit.   No Known Allergies    Review of Systems  Genitourinary:       Hot flashes  All other systems reviewed and are negative.      Objective: BP 122/86   Pulse (!) 58   Wt  216 lb (98 kg)   BMI 37.08 kg/m     Physical Exam Constitutional:      Appearance: Normal appearance.  HENT:     Head: Normocephalic.  Eyes:     Conjunctiva/sclera: Conjunctivae normal.  Cardiovascular:     Rate and Rhythm: Bradycardia present.  Musculoskeletal:        General: Normal range of motion.     Cervical back: Neck supple.  Neurological:     Mental Status: She is alert.  Psychiatric:        Mood and Affect: Mood normal.        Behavior: Behavior normal.       Assessment & Plan:  Encounter for blood pressure examination  Menopausal symptoms Patient did not start Rx due to concerns of side effects. She will start OTC Estrovent and vitamins. She will f/u with her PCP for her annual physical and she will schedule her mammogram that is due in March. Discussed plan of care and all questions answered. RTC for women's health visit in one year. Return sooner for any problems.

## 2020-03-27 ENCOUNTER — Encounter: Payer: Self-pay | Admitting: Nurse Practitioner

## 2020-03-27 ENCOUNTER — Other Ambulatory Visit: Payer: Self-pay

## 2020-03-27 ENCOUNTER — Telehealth: Payer: Self-pay

## 2020-03-27 ENCOUNTER — Ambulatory Visit (INDEPENDENT_AMBULATORY_CARE_PROVIDER_SITE_OTHER): Payer: Medicaid Other | Admitting: Nurse Practitioner

## 2020-03-27 VITALS — BP 122/86 | HR 58 | Wt 216.0 lb

## 2020-03-27 DIAGNOSIS — N951 Menopausal and female climacteric states: Secondary | ICD-10-CM

## 2020-03-27 DIAGNOSIS — Z0130 Encounter for examination of blood pressure without abnormal findings: Secondary | ICD-10-CM | POA: Diagnosis not present

## 2020-03-27 NOTE — Patient Instructions (Signed)
https://www.womenshealth.gov/menopause/menopause-basics"> https://www.clinicalkey.com">  Menopause Menopause is the normal time of a woman's life when menstrual periods stop completely. It marks the natural end to a woman's ability to become pregnant. It can be defined as the absence of a menstrual period for 12 months without another medical cause. The transition to menopause (perimenopause) most often happens between the ages of 45 and 55, and can last for many years. During perimenopause, hormone levels change in your body, which can cause symptoms and affect your health. Menopause may increase your risk for:  Weakened bones (osteoporosis), which causes fractures.  Depression.  Hardening and narrowing of the arteries (atherosclerosis), which can cause heart attacks and strokes. What are the causes? This condition is usually caused by a natural change in hormone levels that happens as you get older. The condition may also be caused by changes that are not natural, including:  Surgery to remove both ovaries (surgical menopause).  Side effects from some medicines, such as chemotherapy used to treat cancer (chemical menopause). What increases the risk? This condition is more likely to start at an earlier age if you have certain medical conditions or have undergone treatments, including:  A tumor of the pituitary gland in the brain.  A disease that affects the ovaries and hormones.  Certain cancer treatments, such as chemotherapy or hormone therapy, or radiation therapy on the pelvis.  Heavy smoking and excessive alcohol use.  Family history of early menopause. This condition is also more likely to develop earlier in women who are very thin. What are the signs or symptoms? Symptoms of this condition include:  Hot flashes.  Irregular menstrual periods.  Night sweats.  Changes in feelings about sex. This could be a decrease in sex drive or an increased discomfort around your  sexuality.  Vaginal dryness and thinning of the vaginal walls. This may cause painful sex.  Dryness of the skin and development of wrinkles.  Headaches.  Problems sleeping (insomnia).  Mood swings or irritability.  Memory problems.  Weight gain.  Hair growth on the face and chest.  Bladder infections or problems with urinating. How is this diagnosed? This condition is diagnosed based on your medical history, a physical exam, your age, your menstrual history, and your symptoms. Hormone tests may also be done. How is this treated? In some cases, no treatment is needed. You and your health care provider should make a decision together about whether treatment is necessary. Treatment will be based on your individual condition and preferences. Treatment for this condition focuses on managing symptoms. Treatment may include:  Menopausal hormone therapy (MHT).  Medicines to treat specific symptoms or complications.  Acupuncture.  Vitamin or herbal supplements. Before starting treatment, make sure to let your health care provider know if you have a personal or family history of these conditions:  Heart disease.  Breast cancer.  Blood clots.  Diabetes.  Osteoporosis. Follow these instructions at home: Lifestyle  Do not use any products that contain nicotine or tobacco, such as cigarettes, e-cigarettes, and chewing tobacco. If you need help quitting, ask your health care provider.  Get at least 30 minutes of physical activity on 5 or more days each week.  Avoid alcoholic and caffeinated beverages, as well as spicy foods. This may help prevent hot flashes.  Get 7-8 hours of sleep each night.  If you have hot flashes, try: ? Dressing in layers. ? Avoiding things that may trigger hot flashes, such as spicy food, warm places, or stress. ? Taking slow, deep   breaths when a hot flash starts. ? Keeping a fan in your home and office.  Find ways to manage stress, such as deep  breathing, meditation, or journaling.  Consider going to group therapy with other women who are having menopause symptoms. Ask your health care provider about recommended group therapy meetings. Eating and drinking  Eat a healthy, balanced diet that contains whole grains, lean protein, low-fat dairy, and plenty of fruits and vegetables.  Your health care provider may recommend adding more soy to your diet. Foods that contain soy include tofu, tempeh, and soy milk.  Eat plenty of foods that contain calcium and vitamin D for bone health. Items that are rich in calcium include low-fat milk, yogurt, beans, almonds, sardines, broccoli, and kale.   Medicines  Take over-the-counter and prescription medicines only as told by your health care provider.  Talk with your health care provider before starting any herbal supplements. If prescribed, take vitamins and supplements as told by your health care provider. General instructions  Keep track of your menstrual periods, including: ? When they occur. ? How heavy they are and how long they last. ? How much time passes between periods.  Keep track of your symptoms, noting when they start, how often you have them, and how long they last.  Use vaginal lubricants or moisturizers to help with vaginal dryness and improve comfort during sex.  Keep all follow-up visits. This is important. This includes any group therapy or counseling.   Contact a health care provider if:  You are still having menstrual periods after age 55.  You have pain during sex.  You have not had a period for 12 months and you develop vaginal bleeding. Get help right away if you have:  Severe depression.  Excessive vaginal bleeding.  Pain when you urinate.  A fast or irregular heartbeat (palpitations).  Severe headaches.  Abdominal pain or severe indigestion. Summary  Menopause is a normal time of life when menstrual periods stop completely. It is usually defined as  the absence of a menstrual period for 12 months without another medical cause.  The transition to menopause (perimenopause) most often happens between the ages of 45 and 55 and can last for several years.  Symptoms can be managed through medicines, lifestyle changes, and complementary therapies such as acupuncture.  Eat a balanced diet that is rich in nutrients to promote bone health and heart health and to manage symptoms during menopause. This information is not intended to replace advice given to you by your health care provider. Make sure you discuss any questions you have with your health care provider. Document Revised: 10/25/2019 Document Reviewed: 07/11/2019 Elsevier Patient Education  2021 Elsevier Inc.  

## 2020-03-27 NOTE — Telephone Encounter (Signed)
Patient presents to clinic requesting appointment for hormone therapy and chest pains. Patient was seen at [facility] that recommended she follow up with PCP regarding concerns.   Patient reports that she has been having chest pain for 2-3 weeks. Denies shortness of breath. Describes pain as soreness and patient believes that pain is related to bad posture. Patient reports that she works in a [employer] and does not have the best posture while at work. Offered to schedule patient appointment for next available on Monday. Patient declined and would like to keep appointment on Friday 2/25, as this is better for her work schedule.   Strict ED precautions given.   Talbot Grumbling, RN

## 2020-03-27 NOTE — Telephone Encounter (Signed)
Thank you for letting me know

## 2020-03-31 IMAGING — US US PELVIS COMPLETE WITH TRANSVAGINAL
1 series · 13 of 25 positions shown · non-contrast
Comparison: None

CLINICAL DATA: Abnormal uterine bleeding, heavy bleeding

EXAM:
TRANSABDOMINAL AND TRANSVAGINAL ULTRASOUND OF PELVIS
TECHNIQUE: Both transabdominal and transvaginal ultrasound examinations of the
pelvis were performed. Transabdominal technique was performed for
global imaging of the pelvis including uterus, ovaries, adnexal
regions, and pelvic cul-de-sac. It was necessary to proceed with
endovaginal exam following the transabdominal exam to visualize the
uterus, endometrium and LEFT ovary.

[Series 1: us pelvis complete with transvaginal · 0.20mm/px · 13 of 74 slices shown]
[im 1/74]
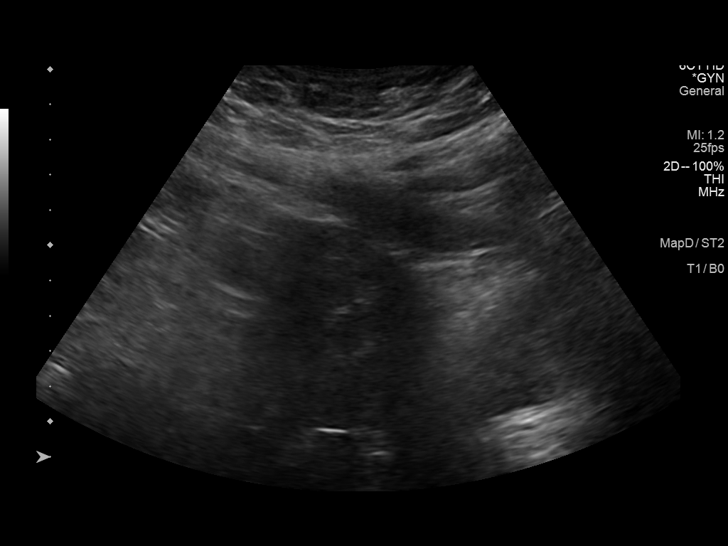
[im 7/74]
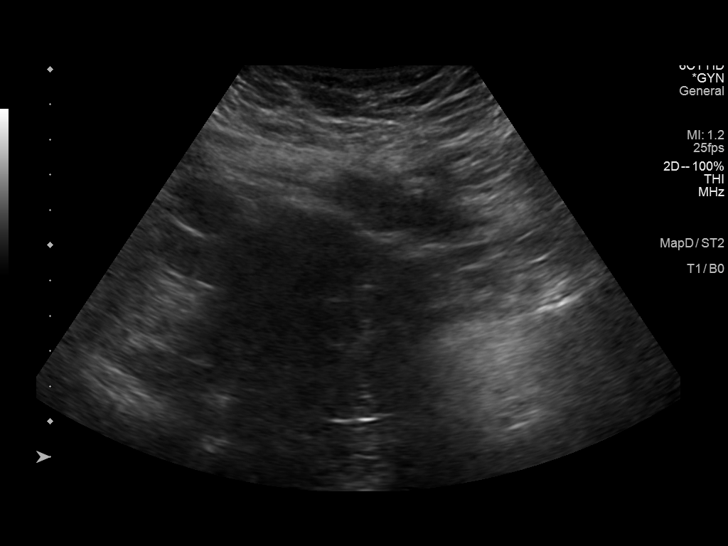
[im 13/74]
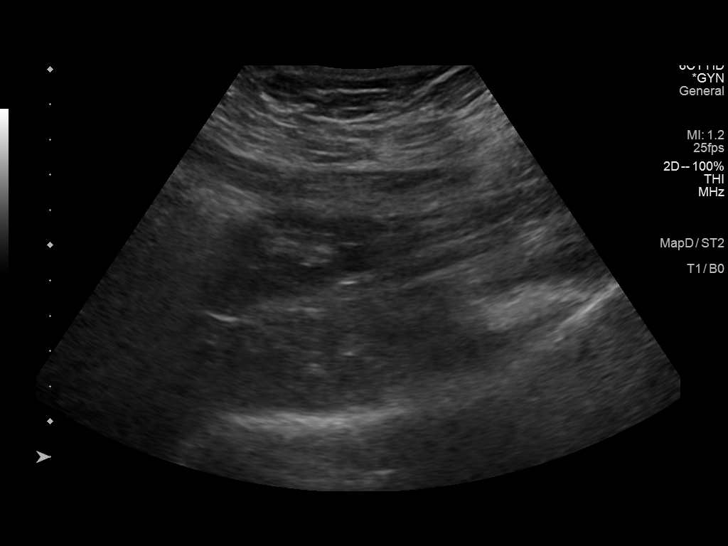
[im 19/74]
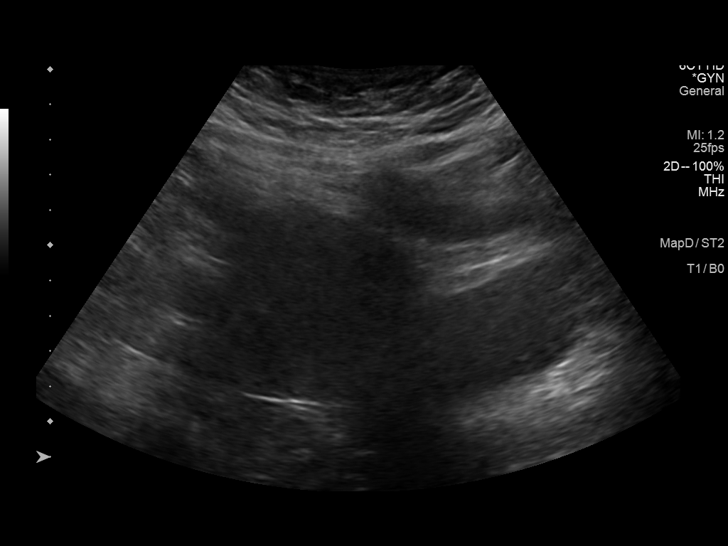
[im 25/74]
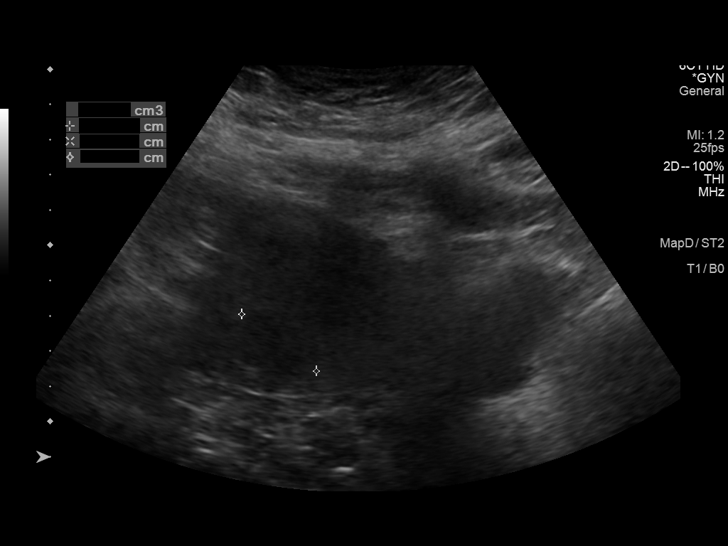
[im 31/74]
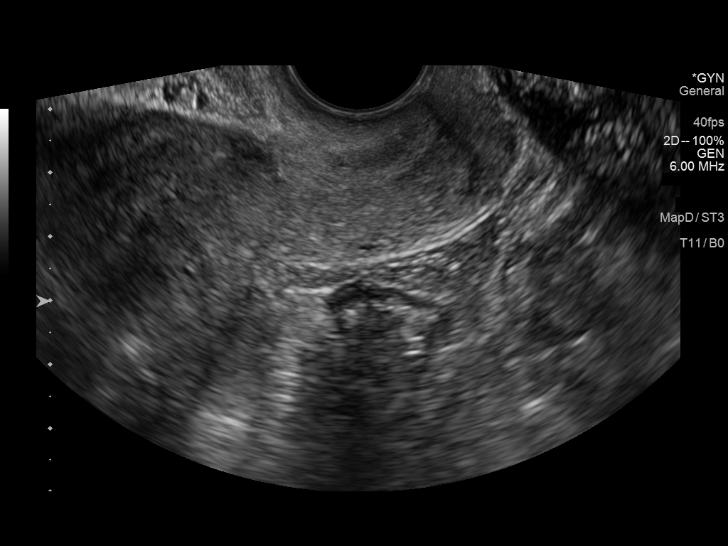
[im 37/74]
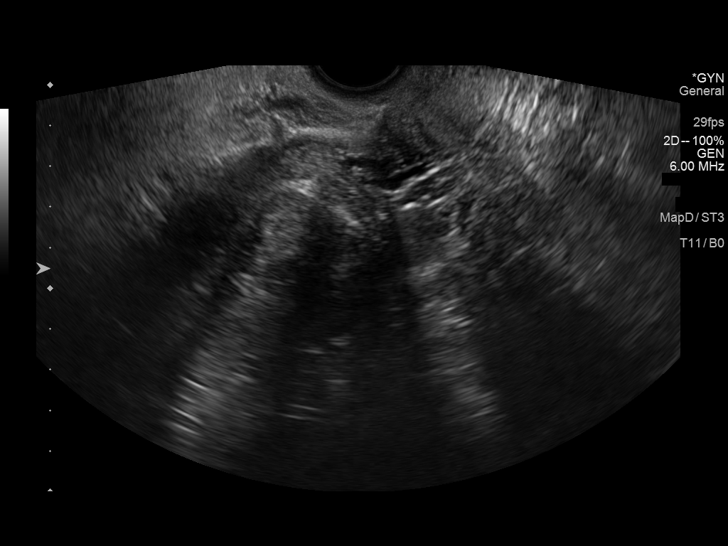
[im 43/74]
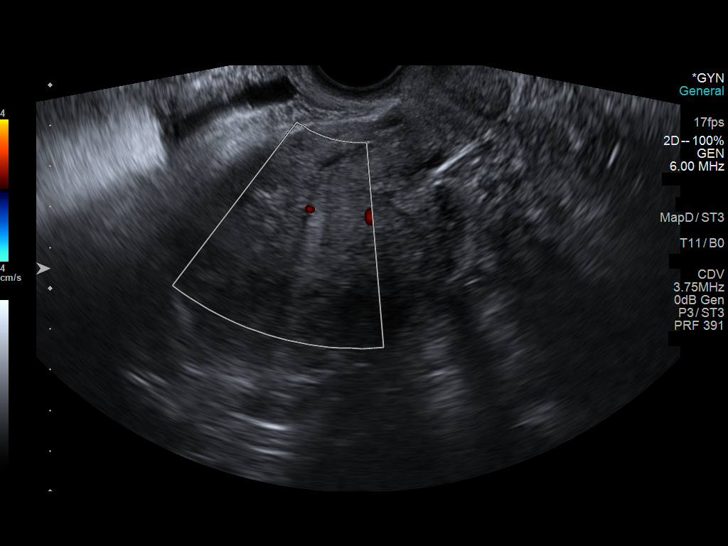
[im 49/74]
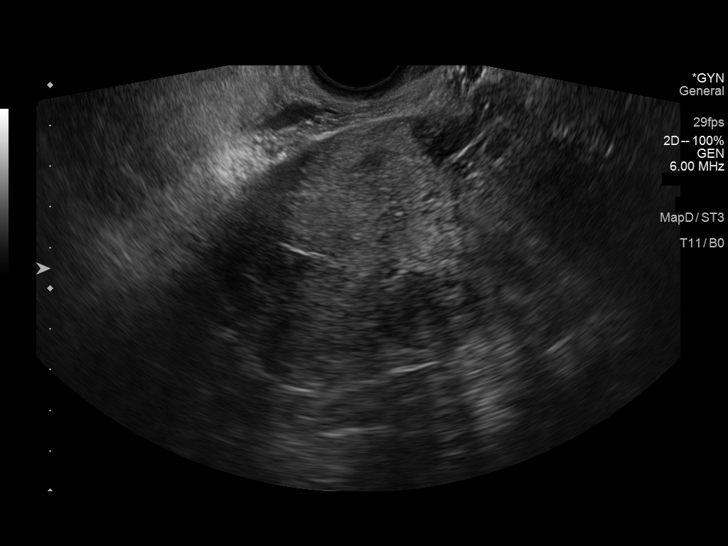
[im 55/74]
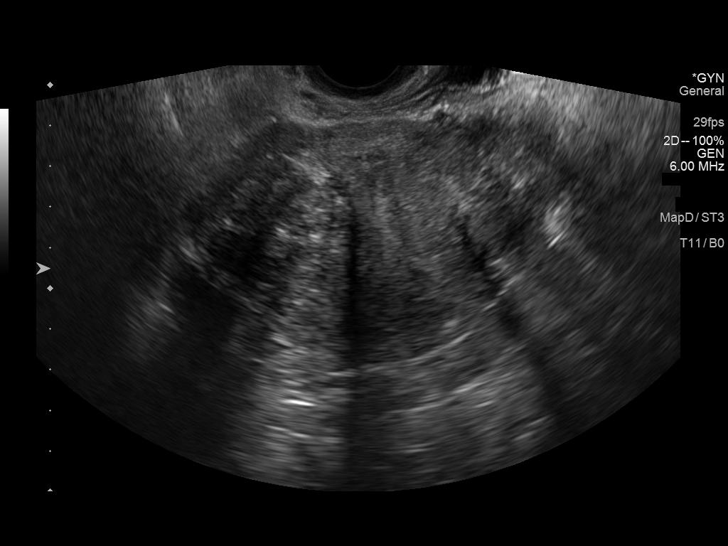
[im 61/74]
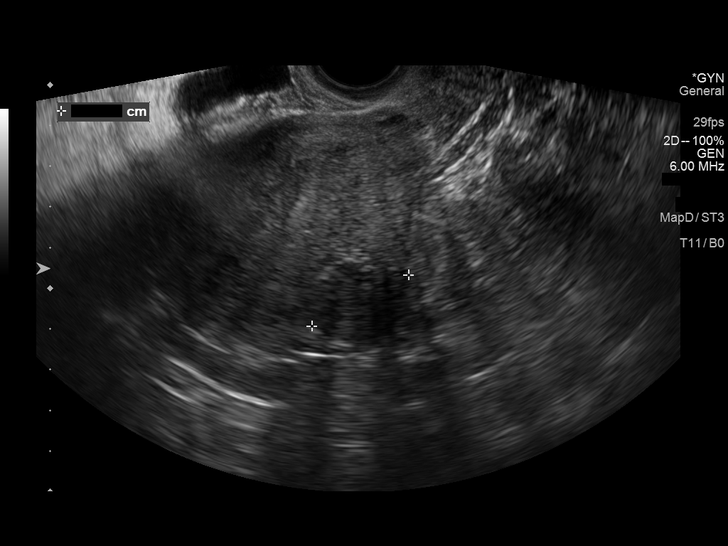
[im 67/74]
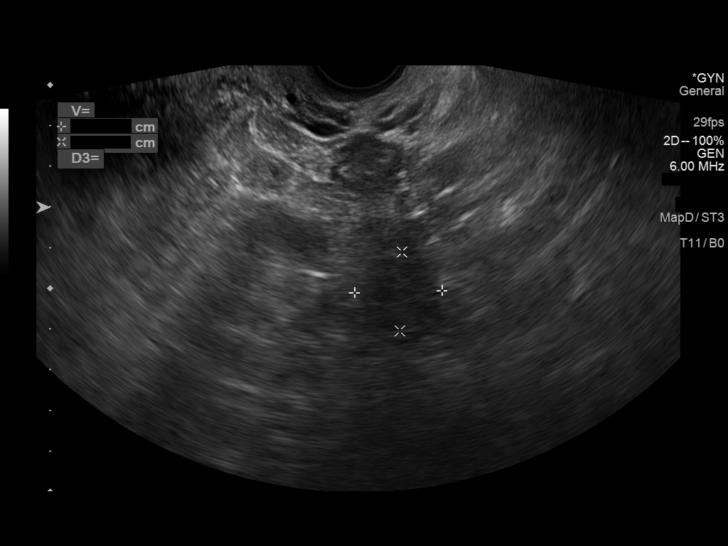
[im 74/74]
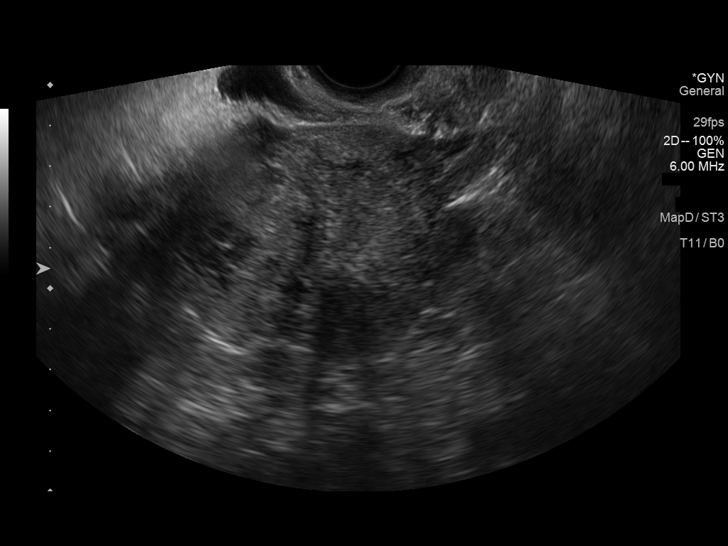

[13 of 25 positions shown; findings below may reference images not displayed]

FINDINGS: Uterus

Measurements: 11.7 x 5.4 x 7.3 cm = volume: 243 mL. Heterogeneous
echogenicity. At least 3 uterine leiomyomata are identified. Large
central anterior uterine leiomyoma likely submucosal 4.5 x 3.4 x
cm. Posterior subserosal leiomyoma mid uterus 2.4 x 1.8 x 1.7 cm.
Anterior leiomyoma 3.5 x 2.5 x 2.7 cm.

Endometrium

Thickness: 9 mm. Tiny cyst within endometrium 5 mm diameter. No
additional endometrial fluid.

Right ovary

Measurements: 3.7 x 1.5 x 2.4 cm = volume: 6.8 mL. Normal morphology
without mass

Left ovary

Measurements: 2.9 x 1.3 x 2.1 cm = volume: 4.1 mL. Normal morphology
without mass

Other findings

No free pelvic fluid.  No adnexal masses.
IMPRESSION: Multiple uterine leiomyomata within a mildly enlarged uterus.

No additional significant abnormalities.

## 2020-04-03 ENCOUNTER — Ambulatory Visit (HOSPITAL_COMMUNITY)
Admission: RE | Admit: 2020-04-03 | Discharge: 2020-04-03 | Disposition: A | Discharge status: Home / Self Care | Payer: Medicaid Other | Source: Ambulatory Visit | Attending: Family Medicine | Admitting: Family Medicine

## 2020-04-03 ENCOUNTER — Ambulatory Visit (INDEPENDENT_AMBULATORY_CARE_PROVIDER_SITE_OTHER): Payer: Medicaid Other | Admitting: Family Medicine

## 2020-04-03 ENCOUNTER — Other Ambulatory Visit: Payer: Self-pay

## 2020-04-03 VITALS — BP 136/87 | HR 66 | Ht 65.0 in | Wt 215.2 lb

## 2020-04-03 DIAGNOSIS — R079 Chest pain, unspecified: Secondary | ICD-10-CM | POA: Insufficient documentation

## 2020-04-03 DIAGNOSIS — R0789 Other chest pain: Secondary | ICD-10-CM | POA: Insufficient documentation

## 2020-04-03 HISTORY — DX: Chest pain, unspecified: R07.9

## 2020-04-03 MED ORDER — ACETAMINOPHEN 500 MG PO TABS
500.0000 mg | ORAL_TABLET | Freq: Once | ORAL | Status: AC
  Administered 2020-04-03: 500 mg via ORAL

## 2020-04-03 MED ORDER — ACETAMINOPHEN 325 MG PO TABS: 650.0000 mg | ORAL_TABLET | Freq: Once | ORAL | Status: DC

## 2020-04-03 MED ORDER — NITROGLYCERIN 0.4 MG/SPRAY TL SOLN: 1.0000 | 12 refills | Status: DC | PRN

## 2020-04-03 MED ORDER — PANTOPRAZOLE SODIUM 40 MG PO TBEC: 40.0000 mg | DELAYED_RELEASE_TABLET | Freq: Every day | ORAL | 3 refills | Status: DC

## 2020-04-03 MED ORDER — NITROGLYCERIN 0.4 MG SL SUBL
0.4000 mg | SUBLINGUAL_TABLET | Freq: Once | SUBLINGUAL | Status: AC
  Administered 2020-04-03: 0.4 mg via SUBLINGUAL

## 2020-04-03 MED ORDER — DICLOFENAC SODIUM 1 % EX GEL: 4.0000 g | Freq: Four times a day (QID) | CUTANEOUS | 0 refills | Status: DC

## 2020-04-03 NOTE — Progress Notes (Signed)
     SUBJECTIVE:   CHIEF COMPLAINT / HPI:   Chelsea Henson is a 45 y.o. female presents with chest pain   Chest tightness  Pt has a hx of muscukoskeletal chest pain which started 5 months ago. She reports pleuritic, "sharp, heavy and tightness" chest pain which started 4 weeks ago when she was in her kitchen. This is similar to the MSK pain she had but worse in nature.  Onset was gradual. Radiates to left arm. .Lasts for mins. Alleviated by resting and motrin and tylenol. Exacerbated by breathing in not exertional. Severity 7/10 but currently 1/10. She thinks it could be related to her bad posture or reflux. She also reports sweating, nauseous, dizziness, dyspnea when she gets the chest pain. Denies palpitations, cough, fevers, trauma. Does not lift weight.  Patient reports family history of cardiac disease-father and grandmother.   Ex smoker-20>years. Smoked pack a day. Quit a month ago. Declined nicotine patch.  [facility] Office Visit from 02/21/2020 in [place]  PHQ-9 Total Score 5     PERTINENT  PMH / PSH: fibroids, MSK chest pain, prediabtes  OBJECTIVE:   BP 136/87   Pulse 66   Ht 5\' 5"  (1.651 m)   Wt 215 lb 3.2 oz (97.6 kg)   SpO2 100%   BMI 35.81 kg/m    General: Alert, no acute distress, well appearing Cardio: Normal S1 and S2, RRR, no r/m/g  MSK: TTP on palpation of sternum Pulm: CTAB, normal work of breathing Abdomen: Bowel sounds normal. Abdomen soft and non-tender.  Extremities: No peripheral edema.  Neuro: Cranial nerves grossly intact   EKG: SR without ischemic changes  ASSESSMENT/PLAN:   Chest pain of uncertain etiology Unclear etiology of chest pain today.  It could be multifactorial- angina, GERD, anemia and musculoskeletal. Also considered PE however wells score 0 and pt is low risk .When she has the chest pain it also radiates to the left arm with diaphoresis, nausea and dizziness which could be angina. She is also  tender on palpation of anterior chest wall today and reports reflux symptoms. EKG-normal sinus rhythm without ischemic changes and vital signs wnl which is reassuring. Today patient's chest pain in clinic was 1/10 severity.  I gave her some nitro stat to see if this helped with the residual pain that she had, pain was unchanged.  Given her risk factors of smoking and family history of CAD I will refer to cardiology. Will also obtain CBC as pt has a hx of anemia. Most recent Hb 11 in June 2021. I have also given her nitroglycerin spray, protonix and Voltaren gel to see if this helps with the chest pain. Strict ER precautions given to pt. She expressed understanding and is happy with the plan.     Lattie Haw, MD PGY-2 [place]

## 2020-04-03 NOTE — Patient Instructions (Addendum)
  Thank you for coming to see me today. It was a pleasure. Today we discussed your chest pain. It is unclear what is causing the chest pain. It could be related to your heart, reflux or musculoskeletal. I recommend cardiology referral. If you get chest pain again you can try the nitroglycerin spray. It can cause headaches and dizziness. Have also prescribed protinix which can help with reflux and voltaren gel which you can apply to the chest wall and see if this helps.  Please follow-up with PCP in 2-3 weeks.  If you have any questions or concerns, please do not hesitate to call the office at [phone].  If you develop fevers>100.5, shortness of breath, chest pain, palpitations, dizziness, abdominal pain, nausea, vomiting, diarrhea or cannot eat or drink then please go to the ER immediately.  Best wishes,   Dr Posey Pronto

## 2020-04-03 NOTE — Assessment & Plan Note (Addendum)
Unclear etiology of chest pain today.  It could be multifactorial- angina, GERD, anemia and musculoskeletal. Also considered PE however wells score 0 and pt is low risk .When she has the chest pain it also radiates to the left arm with diaphoresis, nausea and dizziness which could be angina. She is also tender on palpation of anterior chest wall today and reports reflux symptoms. EKG-normal sinus rhythm without ischemic changes and vital signs wnl which is reassuring. Today patient's chest pain in clinic was 1/10 severity.  I gave her some nitro stat to see if this helped with the residual pain that she had, pain was unchanged.  Given her risk factors of smoking and family history of CAD I will refer to cardiology. Will also obtain CBC as pt has a hx of anemia. Most recent Hb 11 in June 2021. I have also given her nitroglycerin spray, protonix and Voltaren gel to see if this helps with the chest pain. Strict ER precautions given to pt. She expressed understanding and is happy with the plan.

## 2020-04-04 LAB — CBC
Hematocrit: 42.6 % (ref 34.0–46.6)
Hemoglobin: 13.6 g/dL (ref 11.1–15.9)
MCH: 27.4 pg (ref 26.6–33.0)
MCHC: 31.9 g/dL (ref 31.5–35.7)
MCV: 86 fL (ref 79–97)
Platelets: 289 10*3/uL (ref 150–450)
RBC: 4.97 x10E6/uL (ref 3.77–5.28)
RDW: 13.4 % (ref 11.7–15.4)
WBC: 5.2 10*3/uL (ref 3.4–10.8)

## 2020-04-13 NOTE — Progress Notes (Signed)
Cardiology Office Note:    Date:  04/19/2020   ID:  Chelsea Henson, DOB [birth year], MRN [id number]  PCP:  Donney Dice, DO  Cardiologist:  No primary care provider on file.  Electrophysiologist:  None   Referring MD: Lind Covert, *   Chief Complaint  Patient presents with  . Chest Pain    History of Present Illness:    Chelsea Henson is a 45 y.o. female with a hx of tobacco use, prediabetes who is referred by Dr. Erin Hearing for evaluation of chest pain.  She reports that she has been having chest pain for years.  Has occurred twice over the last month.  Describes sometimes sharp but sometimes aching pain in the center to left side of chest.  Does not feel short of breath during episodes but notes she has been getting short of breath when walking up stairs.  Has not noted exertional chest pain but feels it is related to stress.  She denies any lightheadedness or syncope.  Has noted some lower extremity edema at the end of the day.  She has smoked for 25 years up to 1 pack/day but now smokes about 1 pack/week.  Family history includes father had cardiac surgery but she is unsure of the details.   Past Medical History:  Diagnosis Date  . Abnormal uterine bleeding   . Bacterial vaginosis   . Enlarged thyroid   . Fibroids   . Prediabetes   . Seasonal allergies   . Tobacco abuse     Past Surgical History:  Procedure Laterality Date  . CESAREAN SECTION    . DILATATION & CURETTAGE/HYSTEROSCOPY WITH MYOSURE N/A 05/22/2019   Procedure: DILATATION & CURETTAGE/HYSTEROSCOPY WITH MYOSURE;  Surgeon: Osborne Oman, MD;  Location: [facility];  Service: Gynecology;  Laterality: N/A;  . HYSTEROSCOPY N/A 05/22/2019   Procedure: Hysteroscopy with Hydrothermal Ablation ;  Surgeon: Osborne Oman, MD;  Location: [facility];  Service: Gynecology;  Laterality: N/A;  . TUBAL LIGATION      Current Medications: Current Meds  Medication Sig  .  acetaminophen (TYLENOL) 500 MG tablet Take 500 mg by mouth daily as needed (pain).  Marland Kitchen diclofenac Sodium (VOLTAREN) 1 % GEL Apply 4 g topically 4 (four) times daily.  . diphenhydrAMINE (BENADRYL) 25 MG tablet Take 25 mg by mouth daily as needed for itching.  Marland Kitchen FLUoxetine (PROZAC) 20 MG tablet Take 1 tablet (20 mg total) by mouth daily. 1st two weeks: Take one tablet (20mg ) daily After two weeks: Take two tablets (40mg ) daily  . levocetirizine (XYZAL) 5 MG tablet Take 1 tablet (5 mg total) by mouth every evening.  . metoprolol tartrate (LOPRESSOR) 25 MG tablet Take 1 tablet (25 mg) TWO hours prior to CT scan  . nitroGLYCERIN (NITROLINGUAL) 0.4 MG/SPRAY spray Place 1 spray under the tongue every 5 (five) minutes x 3 doses as needed for chest pain.  . pantoprazole (PROTONIX) 40 MG tablet Take 1 tablet (40 mg total) by mouth daily.     Allergies:   Patient has no known allergies.   Social History   Socioeconomic History  . Marital status: Married    Spouse name: Not on file  . Number of children: Not on file  . Years of education: Not on file  . Highest education level: Not on file  Occupational History  . Not on file  Tobacco Use  . Smoking status: Current Some Day Smoker    Packs/day: 0.25  Types: Cigarettes  . Smokeless tobacco: Never Used  Substance and Sexual Activity  . Alcohol use: Yes    Comment: occ  . Drug use: Yes    Types: Marijuana  . Sexual activity: Not on file  Other Topics Concern  . Not on file  Social History Narrative  . Not on file   Social Determinants of Health   Financial Resource Strain: Not on file  Food Insecurity: Not on file  Transportation Needs: Not on file  Physical Activity: Not on file  Stress: Not on file  Social Connections: Not on file     Family History: The patient's family history includes Depression in her paternal grandmother; Diabetes in her sister; Hypertension in her mother. There is no history of Allergic rhinitis,  Angioedema, Asthma, Eczema, Immunodeficiency, or Urticaria.  ROS:   Please see the history of present illness.     All other systems reviewed and are negative.  EKGs/Labs/Other Studies Reviewed:    The following studies were reviewed today:   EKG:  EKG is  ordered today.  The ekg ordered today demonstrates normal sinus rhythm, rate 60, no ST/T abnormalities  Recent Labs: 05/29/2019: TSH 0.315 07/28/2019: ALT 27 04/03/2020: Hemoglobin 13.6; Platelets 289 04/17/2020: BUN 13; Creatinine, Ser 0.73; Potassium 4.7; Sodium 140  Recent Lipid Panel    Component Value Date/Time   CHOL 150 09/17/2018 1249   TRIG 86 09/17/2018 1249   HDL 32 (L) 09/17/2018 1249   CHOLHDL 4.7 (H) 09/17/2018 1249   LDLCALC 101 (H) 09/17/2018 1249    Physical Exam:    VS:  BP (!) 138/92   Pulse 60   Ht 5\' 5"  (1.651 m)   Wt 214 lb 9.6 oz (97.3 kg)   BMI 35.71 kg/m     Wt Readings from Last 3 Encounters:  04/17/20 214 lb 9.6 oz (97.3 kg)  04/03/20 215 lb 3.2 oz (97.6 kg)  03/27/20 216 lb (98 kg)     GEN: Well nourished, well developed in no acute distress HEENT: Normal NECK: No JVD; No carotid bruits LYMPHATICS: No lymphadenopathy CARDIAC: RRR, no murmurs, rubs, gallops RESPIRATORY:  Clear to auscultation without rales, wheezing or rhonchi  ABDOMEN: Soft, non-tender, non-distended MUSCULOSKELETAL:  No edema; No deformity  SKIN: Warm and dry NEUROLOGIC:  Alert and oriented x 3 PSYCHIATRIC:  Normal affect   ASSESSMENT:    1. Chest pain of uncertain etiology   2. Pre-procedure lab exam   3. DOE (dyspnea on exertion)   4. Tobacco use    PLAN:    Chest pain/dyspnea: Chest pain is atypical in description but does have CAD risk factors (tobacco use).  In addition she is having dyspnea on exertion which could represent anginal equivalent -Recommend coronary CTA to evaluate for obstructive CAD.  Low resting heart rate, will give metoprolol 25 mg prior to study -Echocardiogram to rule out structural  heart disease  Tobacco use: Patient counseled on the risk of tobacco use and cessation strongly encouraged  RTC in 3 months  Medication Adjustments/Labs and Tests Ordered: Current medicines are reviewed at length with the patient today.  Concerns regarding medicines are outlined above.  Orders Placed This Encounter  Procedures  . CT CORONARY MORPH W/CTA COR W/SCORE W/CA W/CM &/OR WO/CM  . CT CORONARY FRACTIONAL FLOW RESERVE DATA PREP  . CT CORONARY FRACTIONAL FLOW RESERVE FLUID ANALYSIS  . Basic metabolic panel  . EKG 12-Lead  . ECHOCARDIOGRAM COMPLETE   Meds ordered this encounter  Medications  .  metoprolol tartrate (LOPRESSOR) 25 MG tablet    Sig: Take 1 tablet (25 mg) TWO hours prior to CT scan    Dispense:  1 tablet    Refill:  0    Patient Instructions  Medication Instructions:  Your physician recommends that you continue on your current medications as directed. Please refer to the Current Medication list given to you today.  Lab Work: BMET today  If you have labs (blood work) drawn today and your tests are completely normal, you will receive your results only by: Marland Kitchen MyChart Message (if you have MyChart) OR . A paper copy in the mail If you have any lab test that is abnormal or we need to change your treatment, we will call you to review the results.   Testing/Procedures: Coronary CTA-see instructions below  Your physician has requested that you have an echocardiogram. Echocardiography is a painless test that uses sound waves to create images of your heart. It provides your doctor with information about the size and shape of your heart and how well your heart's chambers and valves are working. This procedure takes approximately one hour. There are no restrictions for this procedure.  This will be done at our [facility] location:  [place]: At [facility], you and your health needs are our priority.  As part of our continuing  mission to provide you with exceptional heart care, we have created designated Provider Care Teams.  These Care Teams include your primary Cardiologist (physician) and Advanced Practice Providers (APPs -  Physician Assistants and Nurse Practitioners) who all work together to provide you with the care you need, when you need it.  We recommend signing up for the patient portal called "MyChart".  Sign up information is provided on this After Visit Summary.  MyChart is used to connect with patients for Virtual Visits (Telemedicine).  Patients are able to view lab/test results, encounter notes, upcoming appointments, etc.  Non-urgent messages can be sent to your provider as well.   To learn more about what you can do with MyChart, go to [email or web address].    Your next appointment:   3 month(s)  The format for your next appointment:   In Person  Provider:   Oswaldo Milian, MD      Your cardiac CT will be scheduled at one of the below locations:   [facility] [address] [place], [place] [address] [phone]  [place] [address] [place], [place] [address] [phone]  If scheduled at [facility], please arrive at the [facility] main entrance (entrance A) of [facility] 30 minutes prior to test start time. Proceed to the [facility] Radiology Department (first floor) to check-in and test prep.  If scheduled at [facility], please arrive 15 mins early for check-in and test prep.  Please follow these instructions carefully (unless otherwise directed):  On the Night Before the Test: . Be sure to Drink plenty of water. . Do not consume any caffeinated/decaffeinated beverages or chocolate 12 hours prior to your test. . Do not take any antihistamines 12 hours prior to your test.  On the Day of the Test: . Drink plenty of water until 1 hour prior to the  test. . Do not eat any food 4 hours prior to the test. . You may take your regular medications prior to the test.  . Take metoprolol (Lopressor) two  hours prior to test. . FEMALES- please wear underwire-free bra if available      After the Test: . Drink plenty of water. . After receiving IV contrast, you may experience a mild flushed feeling. This is normal. . On occasion, you may experience a mild rash up to 24 hours after the test. This is not dangerous. If this occurs, you can take Benadryl 25 mg and increase your fluid intake. . If you experience trouble breathing, this can be serious. If it is severe call 911 IMMEDIATELY. If it is mild, please call our office. . If you take any of these medications: Glipizide/Metformin, Avandament, Glucavance, please do not take 48 hours after completing test unless otherwise instructed.   Once we have confirmed authorization from your insurance company, we will call you to set up a date and time for your test. Based on how quickly your insurance processes prior authorizations requests, please allow up to 4 weeks to be contacted for scheduling your Cardiac CT appointment. Be advised that routine Cardiac CT appointments could be scheduled as many as 8 weeks after your provider has ordered it.  For non-scheduling related questions, please contact the cardiac imaging nurse navigator should you have any questions/concerns: Marchia Bond, Cardiac Imaging Nurse Navigator Gordy Clement, Cardiac Imaging Nurse Navigator [facility] Direct Office Dial: [phone]   For scheduling needs, including cancellations and rescheduling, please call [place], [phone].          Signed, Donato Heinz, MD  04/19/2020 9:04 PM    [place]

## 2020-04-17 ENCOUNTER — Ambulatory Visit (INDEPENDENT_AMBULATORY_CARE_PROVIDER_SITE_OTHER): Payer: Medicaid Other | Admitting: Cardiology

## 2020-04-17 ENCOUNTER — Encounter: Payer: Self-pay | Admitting: Cardiology

## 2020-04-17 ENCOUNTER — Other Ambulatory Visit: Payer: Self-pay

## 2020-04-17 VITALS — BP 138/92 | HR 60 | Ht 65.0 in | Wt 214.6 lb

## 2020-04-17 DIAGNOSIS — Z720 Tobacco use: Secondary | ICD-10-CM | POA: Diagnosis not present

## 2020-04-17 DIAGNOSIS — R079 Chest pain, unspecified: Secondary | ICD-10-CM | POA: Diagnosis not present

## 2020-04-17 DIAGNOSIS — Z01812 Encounter for preprocedural laboratory examination: Secondary | ICD-10-CM

## 2020-04-17 DIAGNOSIS — R0609 Other forms of dyspnea: Secondary | ICD-10-CM

## 2020-04-17 DIAGNOSIS — R0600 Dyspnea, unspecified: Secondary | ICD-10-CM

## 2020-04-17 MED ORDER — METOPROLOL TARTRATE 25 MG PO TABS: ORAL_TABLET | ORAL | 0 refills | Status: DC

## 2020-04-17 NOTE — Patient Instructions (Signed)
Medication Instructions:  Your physician recommends that you continue on your current medications as directed. Please refer to the Current Medication list given to you today.  Lab Work: BMET today  If you have labs (blood work) drawn today and your tests are completely normal, you will receive your results only by: Marland Kitchen MyChart Message (if you have MyChart) OR . A paper copy in the mail If you have any lab test that is abnormal or we need to change your treatment, we will call you to review the results.   Testing/Procedures: Coronary CTA-see instructions below  Your physician has requested that you have an echocardiogram. Echocardiography is a painless test that uses sound waves to create images of your heart. It provides your doctor with information about the size and shape of your heart and how well your heart's chambers and valves are working. This procedure takes approximately one hour. There are no restrictions for this procedure.  This will be done at our [facility] location:  [place]: At [employer] [facility], you and your health needs are our priority.  As part of our continuing mission to provide you with exceptional heart care, we have created designated Provider Care Teams.  These Care Teams include your primary Cardiologist (physician) and Advanced Practice Providers (APPs -  Physician Assistants and Nurse Practitioners) who all work together to provide you with the care you need, when you need it.  We recommend signing up for the patient portal called "MyChart".  Sign up information is provided on this After Visit Summary.  MyChart is used to connect with patients for Virtual Visits (Telemedicine).  Patients are able to view lab/test results, encounter notes, upcoming appointments, etc.  Non-urgent messages can be sent to your provider as well.   To learn more about what you can do with MyChart, go to [email or web address].    Your next appointment:    3 month(s)  The format for your next appointment:   In Person  Provider:   Oswaldo Milian, MD      Your cardiac CT will be scheduled at one of the below locations:   [facility] [address] [place], [place] [address] [phone]  [place] [address] [place], [place] [address] [phone]  If scheduled at [facility], please arrive at the [facility] main entrance (entrance A) of [facility] 30 minutes prior to test start time. Proceed to the [facility] Radiology Department (first floor) to check-in and test prep.  If scheduled at [facility], please arrive 15 mins early for check-in and test prep.  Please follow these instructions carefully (unless otherwise directed):  On the Night Before the Test: . Be sure to Drink plenty of water. . Do not consume any caffeinated/decaffeinated beverages or chocolate 12 hours prior to your test. . Do not take any antihistamines 12 hours prior to your test.  On the Day of the Test: . Drink plenty of water until 1 hour prior to the test. . Do not eat any food 4 hours prior to the test. . You may take your regular medications prior to the test.  . Take metoprolol (Lopressor) two hours prior to test. . FEMALES- please wear underwire-free bra if available      After the Test: . Drink plenty of water. . After receiving IV contrast, you may experience a mild flushed feeling. This is normal. .  On occasion, you may experience a mild rash up to 24 hours after the test. This is not dangerous. If this occurs, you can take Benadryl 25 mg and increase your fluid intake. . If you experience trouble breathing, this can be serious. If it is severe call 911 IMMEDIATELY. If it is mild, please call our office. . If you take any of these medications: Glipizide/Metformin, Avandament, Glucavance, please do not take 48 hours after  completing test unless otherwise instructed.   Once we have confirmed authorization from your insurance company, we will call you to set up a date and time for your test. Based on how quickly your insurance processes prior authorizations requests, please allow up to 4 weeks to be contacted for scheduling your Cardiac CT appointment. Be advised that routine Cardiac CT appointments could be scheduled as many as 8 weeks after your provider has ordered it.  For non-scheduling related questions, please contact the cardiac imaging nurse navigator should you have any questions/concerns: Marchia Bond, Cardiac Imaging Nurse Navigator Gordy Clement, Cardiac Imaging Nurse Navigator [place] [facility] Direct Office Dial: [phone]   For scheduling needs, including cancellations and rescheduling, please call [place], [phone].

## 2020-04-18 LAB — BASIC METABOLIC PANEL
BUN/Creatinine Ratio: 18 (ref 9–23)
BUN: 13 mg/dL (ref 6–24)
CO2: 25 mmol/L (ref 20–29)
Calcium: 9.7 mg/dL (ref 8.7–10.2)
Chloride: 102 mmol/L (ref 96–106)
Creatinine, Ser: 0.73 mg/dL (ref 0.57–1.00)
Glucose: 84 mg/dL (ref 65–99)
Potassium: 4.7 mmol/L (ref 3.5–5.2)
Sodium: 140 mmol/L (ref 134–144)
eGFR: 103 mL/min/{1.73_m2} (ref >59)

## 2020-04-19 ENCOUNTER — Other Ambulatory Visit: Payer: Self-pay | Admitting: Family Medicine

## 2020-04-19 DIAGNOSIS — R0789 Other chest pain: Secondary | ICD-10-CM

## 2020-05-13 ENCOUNTER — Other Ambulatory Visit (HOSPITAL_COMMUNITY): Payer: Medicaid Other

## 2020-05-13 ENCOUNTER — Ambulatory Visit (HOSPITAL_COMMUNITY): Payer: Medicaid Other | Attending: Cardiology

## 2020-05-13 ENCOUNTER — Other Ambulatory Visit: Payer: Self-pay

## 2020-05-13 DIAGNOSIS — R079 Chest pain, unspecified: Secondary | ICD-10-CM | POA: Diagnosis not present

## 2020-05-13 LAB — ECHOCARDIOGRAM COMPLETE
Area-P 1/2: 2.95 cm2
S' Lateral: 3.1 cm

## 2020-05-21 ENCOUNTER — Telehealth (HOSPITAL_COMMUNITY): Payer: Self-pay | Admitting: Emergency Medicine

## 2020-05-21 NOTE — Telephone Encounter (Signed)
Attempted to call patient regarding upcoming cardiac CT appointment. °Left message on voicemail with name and callback number °Vendetta Pittinger RN Navigator Cardiac Imaging °[place] °[phone] Office °[phone] Cell ° °

## 2020-05-21 NOTE — Telephone Encounter (Signed)
Reaching out to patient to offer assistance regarding upcoming cardiac imaging study; pt verbalizes understanding of appt date/time, parking situation and where to check in, pre-test NPO status and medications ordered, and verified current allergies; name and call back number provided for further questions should they arise Marchia Bond RN Navigator Cardiac Imaging [facility] [phone] office [phone] cell  25mg  metoprolol tartrate 2 hr prior to scan

## 2020-05-22 ENCOUNTER — Ambulatory Visit (HOSPITAL_COMMUNITY): Admission: RE | Admit: 2020-05-22 | Payer: Medicaid Other | Source: Ambulatory Visit

## 2020-05-22 ENCOUNTER — Ambulatory Visit (HOSPITAL_COMMUNITY): Payer: Medicaid Other

## 2020-05-28 ENCOUNTER — Telehealth (HOSPITAL_COMMUNITY): Payer: Self-pay | Admitting: Emergency Medicine

## 2020-05-28 NOTE — Telephone Encounter (Signed)
Pt returning phone call regarding upcoming cardiac imaging study; pt verbalizes understanding of appt date/time, parking situation and where to check in, pre-test NPO status and medications ordered, and verified current allergies; name and call back number provided for further questions should they arise Marchia Bond RN Navigator Cardiac Imaging [facility] [phone] office [phone] cell  25mg  metoprolol 2 hr prior to scan, holding xyzal  Clarise Cruz

## 2020-05-28 NOTE — Telephone Encounter (Signed)
Attempted to call patient regarding upcoming cardiac CT appointment. °Left message on voicemail with name and callback number °Jordanne Elsbury RN Navigator Cardiac Imaging °[facility] °[phone] Office °[phone] Cell ° °

## 2020-05-29 ENCOUNTER — Other Ambulatory Visit: Payer: Self-pay

## 2020-05-29 ENCOUNTER — Ambulatory Visit (HOSPITAL_COMMUNITY)
Admission: RE | Admit: 2020-05-29 | Discharge: 2020-05-29 | Disposition: A | Discharge status: Home / Self Care | Payer: Medicaid Other | Source: Ambulatory Visit | Attending: Cardiology | Admitting: Cardiology

## 2020-05-29 DIAGNOSIS — R079 Chest pain, unspecified: Secondary | ICD-10-CM | POA: Insufficient documentation

## 2020-05-29 DIAGNOSIS — Z006 Encounter for examination for normal comparison and control in clinical research program: Secondary | ICD-10-CM

## 2020-05-29 MED ORDER — NITROGLYCERIN 0.4 MG SL SUBL
0.8000 mg | SUBLINGUAL_TABLET | Freq: Once | SUBLINGUAL | Status: AC
  Administered 2020-05-29: 0.8 mg via SUBLINGUAL

## 2020-05-29 MED ORDER — IOHEXOL 350 MG/ML SOLN
100.0000 mL | Freq: Once | INTRAVENOUS | Status: AC | PRN
  Administered 2020-05-29: 95 mL via INTRAVENOUS

## 2020-05-29 MED ORDER — NITROGLYCERIN 0.4 MG SL SUBL
SUBLINGUAL_TABLET | SUBLINGUAL | Status: AC
  Filled 2020-05-29: qty 2

## 2020-05-29 NOTE — Research (Signed)
IDENTIFY Informed Consent                  Subject Name: Chelsea Henson   Subject met inclusion and exclusion criteria.  The informed consent form, study requirements and expectations were reviewed with the subject and questions and concerns were addressed prior to the signing of the consent form.  The subject verbalized understanding of the trial requirements.  The subject agreed to participate in the IDENTIFY trial and signed the informed consent at 08:11AM on 05/29/20.  The informed consent was obtained prior to performance of any protocol-specific procedures for the subject.  A copy of the signed informed consent was given to the subject and a copy was placed in the subject's medical record.   Sallye Ober , Research Assistant

## 2020-06-18 ENCOUNTER — Telehealth: Payer: Self-pay | Admitting: Cardiology

## 2020-06-18 NOTE — Telephone Encounter (Signed)
    Pt is returning call, she said she is at work and has poor reception area. She said to call her back and leave her a detailed message about her result to put her at ease.

## 2020-06-18 NOTE — Telephone Encounter (Signed)
Donato Heinz, MD  05/31/2020 11:20 PM EDT      Normal coronary arteries   Patient aware and verbalized understanding.

## 2020-06-30 ENCOUNTER — Other Ambulatory Visit: Payer: Self-pay | Admitting: Family Medicine

## 2020-06-30 DIAGNOSIS — R0789 Other chest pain: Secondary | ICD-10-CM

## 2020-07-15 ENCOUNTER — Other Ambulatory Visit: Payer: Self-pay | Admitting: *Deleted

## 2020-07-15 DIAGNOSIS — N760 Acute vaginitis: Secondary | ICD-10-CM

## 2020-07-15 MED ORDER — BORIC ACID CRYS: 600.0000 mg | CRYSTALS | Freq: Every day | 7 refills | Status: DC

## 2020-07-19 NOTE — Progress Notes (Deleted)
Cardiology Office Note:    Date:  07/19/2020   ID:  Chelsea Henson, DOB Apr 22, 1975, MRN [id number]  PCP:  Donney Dice, DO  Cardiologist:  None  Electrophysiologist:  None   Referring MD: Donney Dice, DO   No chief complaint on file.   History of Present Illness:    Chelsea Henson is a 45 y.o. female with a hx of tobacco use, prediabetes who presents for follow-up.  She was referred by Dr. Erin Hearing for evaluation of chest pain, initially seen on 04/17/2020.  She reports that she has been having chest pain for years.  Has occurred twice over the last month.  Describes sometimes sharp but sometimes aching pain in the center to left side of chest.  Does not feel short of breath during episodes but notes she has been getting short of breath when walking up stairs.  Has not noted exertional chest pain but feels it is related to stress.  She denies any lightheadedness or syncope.  Has noted some lower extremity edema at the end of the day.  She has smoked for 25 years up to 1 pack/day but now smokes about 1 pack/week.  Family history includes father had cardiac surgery but she is unsure of the details.  Echocardiogram on 05/13/2020 showed normal biventricular function, no significant valvular disease.  Coronary CTA on 05/29/2020 showed normal coronary arteries (calcium score 0).  Since last clinic visit,   Past Medical History:  Diagnosis Date   Abnormal uterine bleeding    Bacterial vaginosis    Enlarged thyroid    Fibroids    Prediabetes    Seasonal allergies    Tobacco abuse     Past Surgical History:  Procedure Laterality Date   CESAREAN SECTION     DILATATION & CURETTAGE/HYSTEROSCOPY WITH MYOSURE N/A 05/22/2019   Procedure: DILATATION & CURETTAGE/HYSTEROSCOPY WITH MYOSURE;  Surgeon: Osborne Oman, MD;  Location: [facility];  Service: Gynecology;  Laterality: N/A;   HYSTEROSCOPY N/A 05/22/2019   Procedure: Hysteroscopy with Hydrothermal Ablation ;   Surgeon: Osborne Oman, MD;  Location: [facility];  Service: Gynecology;  Laterality: N/A;   TUBAL LIGATION      Current Medications: No outpatient medications have been marked as taking for the 07/20/20 encounter (Appointment) with Donato Heinz, MD.     Allergies:   Patient has no known allergies.   Social History   Socioeconomic History   Marital status: Married    Spouse name: Not on file   Number of children: Not on file   Years of education: Not on file   Highest education level: Not on file  Occupational History   Not on file  Tobacco Use   Smoking status: Some Days    Packs/day: 0.25    Pack years: 0.00    Types: Cigarettes   Smokeless tobacco: Never  Substance and Sexual Activity   Alcohol use: Yes    Comment: occ   Drug use: Yes    Types: Marijuana   Sexual activity: Not on file  Other Topics Concern   Not on file  Social History Narrative   Not on file   Social Determinants of Health   Financial Resource Strain: Not on file  Food Insecurity: Not on file  Transportation Needs: Not on file  Physical Activity: Not on file  Stress: Not on file  Social Connections: Not on file     Family History: The patient's family history includes Depression in  her paternal grandmother; Diabetes in her sister; Hypertension in her mother. There is no history of Allergic rhinitis, Angioedema, Asthma, Eczema, Immunodeficiency, or Urticaria.  ROS:   Please see the history of present illness.     All other systems reviewed and are negative.  EKGs/Labs/Other Studies Reviewed:    The following studies were reviewed today:   EKG:  EKG is  ordered today.  The ekg ordered today demonstrates normal sinus rhythm, rate 60, no ST/T abnormalities  Recent Labs: 07/28/2019: ALT 27 04/03/2020: Hemoglobin 13.6; Platelets 289 04/17/2020: BUN 13; Creatinine, Ser 0.73; Potassium 4.7; Sodium 140  Recent Lipid Panel    Component Value Date/Time   CHOL 150  09/17/2018 1249   TRIG 86 09/17/2018 1249   HDL 32 (L) 09/17/2018 1249   CHOLHDL 4.7 (H) 09/17/2018 1249   LDLCALC 101 (H) 09/17/2018 1249    Physical Exam:    VS:  There were no vitals taken for this visit.    Wt Readings from Last 3 Encounters:  04/17/20 214 lb 9.6 oz (97.3 kg)  04/03/20 215 lb 3.2 oz (97.6 kg)  03/27/20 216 lb (98 kg)     GEN: Well nourished, well developed in no acute distress HEENT: Normal NECK: No JVD; No carotid bruits LYMPHATICS: No lymphadenopathy CARDIAC: RRR, no murmurs, rubs, gallops RESPIRATORY:  Clear to auscultation without rales, wheezing or rhonchi  ABDOMEN: Soft, non-tender, non-distended MUSCULOSKELETAL:  No edema; No deformity  SKIN: Warm and dry NEUROLOGIC:  Alert and oriented x 3 PSYCHIATRIC:  Normal affect   ASSESSMENT:    No diagnosis found.  PLAN:    Chest pain/dyspnea: Chest pain is atypical in description but does have CAD risk factors (tobacco use).  In addition she is having dyspnea on exertion.  Echocardiogram on 05/13/2020 showed normal biventricular function, no significant valvular disease.  Coronary CTA on 05/29/2020 showed normal coronary arteries (calcium score 0).  Tobacco use: Patient counseled on the risk of tobacco use and cessation strongly encouraged  RTC in ***  Medication Adjustments/Labs and Tests Ordered: Current medicines are reviewed at length with the patient today.  Concerns regarding medicines are outlined above.  No orders of the defined types were placed in this encounter.  No orders of the defined types were placed in this encounter.   There are no Patient Instructions on file for this visit.   Signed, Donato Heinz, MD  07/19/2020 2:35 PM    [facility]

## 2020-07-20 ENCOUNTER — Ambulatory Visit: Payer: Medicaid Other | Admitting: Cardiology

## 2020-09-08 ENCOUNTER — Other Ambulatory Visit: Payer: Self-pay

## 2020-09-08 ENCOUNTER — Ambulatory Visit (INDEPENDENT_AMBULATORY_CARE_PROVIDER_SITE_OTHER): Payer: Self-pay

## 2020-09-08 DIAGNOSIS — Z111 Encounter for screening for respiratory tuberculosis: Secondary | ICD-10-CM

## 2020-09-08 NOTE — Progress Notes (Signed)
Tuberculin skin test applied to left ventral forearm. Patient to return on 8/4 to have site read.  Reminder card given.

## 2020-09-09 ENCOUNTER — Telehealth: Payer: Self-pay

## 2020-09-09 DIAGNOSIS — Z006 Encounter for examination for normal comparison and control in clinical research program: Secondary | ICD-10-CM

## 2020-09-09 DIAGNOSIS — R52 Pain, unspecified: Secondary | ICD-10-CM | POA: Diagnosis not present

## 2020-09-09 DIAGNOSIS — R059 Cough, unspecified: Secondary | ICD-10-CM | POA: Diagnosis not present

## 2020-09-09 DIAGNOSIS — Z20822 Contact with and (suspected) exposure to covid-19: Secondary | ICD-10-CM | POA: Diagnosis not present

## 2020-09-09 DIAGNOSIS — R519 Headache, unspecified: Secondary | ICD-10-CM | POA: Diagnosis not present

## 2020-09-09 DIAGNOSIS — R5383 Other fatigue: Secondary | ICD-10-CM | POA: Diagnosis not present

## 2020-09-09 NOTE — Telephone Encounter (Signed)
I called patient for her 90-day Identify Study follow up phone call. Patient is not experiencing any cardiac symptoms at this time. I reminded patient I would call her in April for her 1 year follow-up.

## 2020-09-10 ENCOUNTER — Other Ambulatory Visit: Payer: Self-pay

## 2020-09-10 ENCOUNTER — Ambulatory Visit (INDEPENDENT_AMBULATORY_CARE_PROVIDER_SITE_OTHER): Payer: Self-pay

## 2020-09-10 DIAGNOSIS — Z111 Encounter for screening for respiratory tuberculosis: Secondary | ICD-10-CM

## 2020-09-10 LAB — TB SKIN TEST
Induration: 0 mm
TB Skin Test: NEGATIVE

## 2020-09-10 NOTE — Progress Notes (Signed)
Patient is here for a PPD read.  It was placed on 09/08/2020 in the left forearm @ 2:30 pm.    PPD RESULTS:  Result: negative Induration: 0 mm  Letter created and given to patient for documentation purposes. Talbot Grumbling, RN

## 2020-09-13 DIAGNOSIS — J029 Acute pharyngitis, unspecified: Secondary | ICD-10-CM | POA: Diagnosis not present

## 2020-09-13 DIAGNOSIS — K120 Recurrent oral aphthae: Secondary | ICD-10-CM | POA: Diagnosis not present

## 2020-10-22 ENCOUNTER — Ambulatory Visit: Payer: Medicaid Other | Admitting: Family Medicine

## 2020-10-28 IMAGING — MG DIGITAL SCREENING BILAT W/ CAD
4 series · 4 of 4 positions shown · non-contrast
Comparison: None.

CLINICAL DATA: Screening.

EXAM:
DIGITAL SCREENING BILATERAL MAMMOGRAM WITH CAD

[L CC]
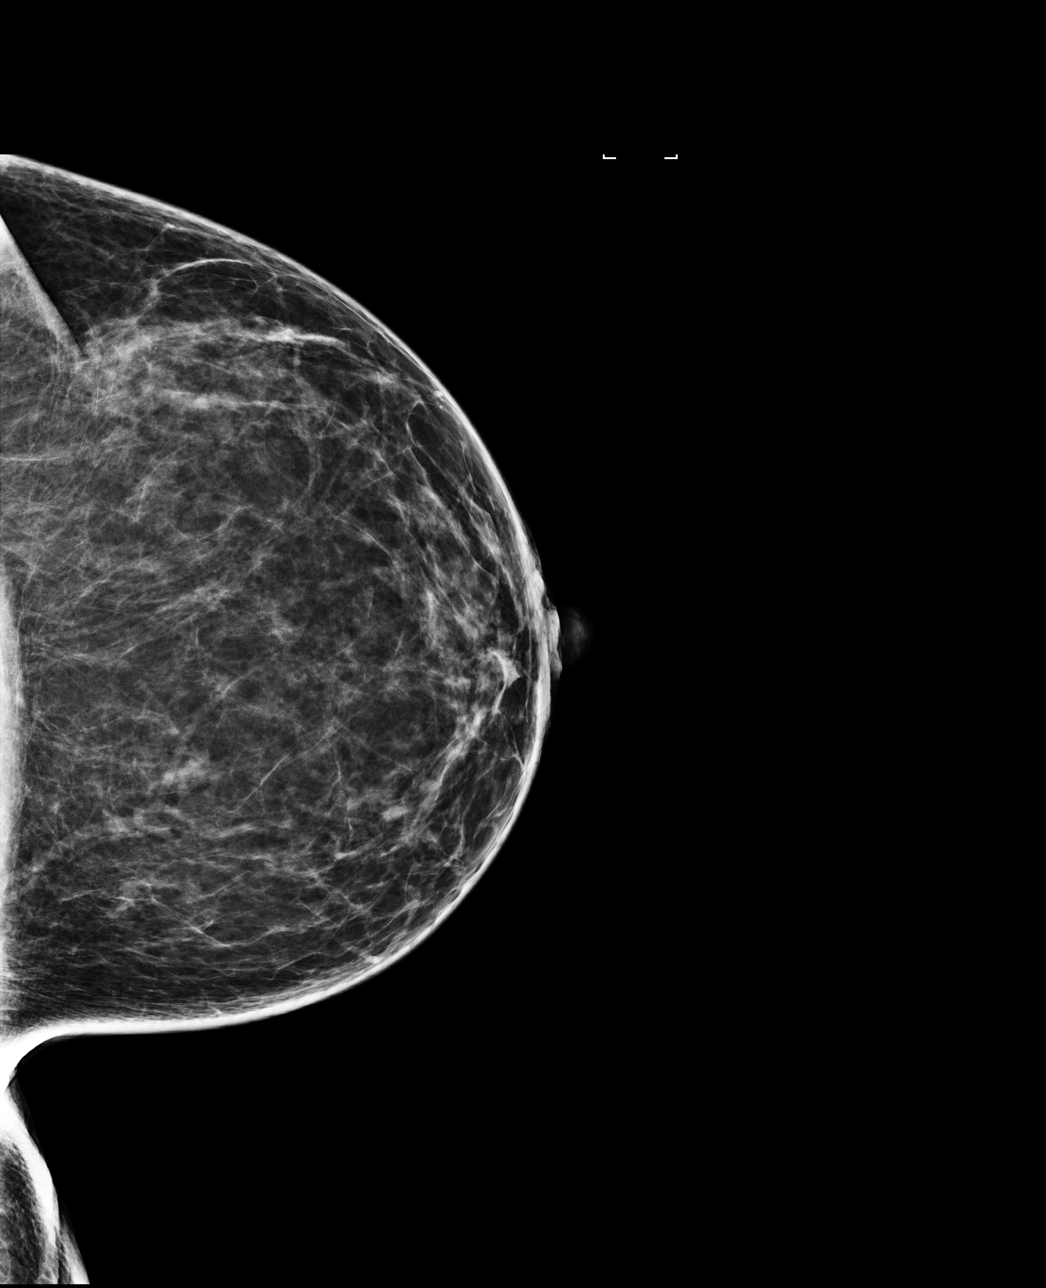

[R MLO]
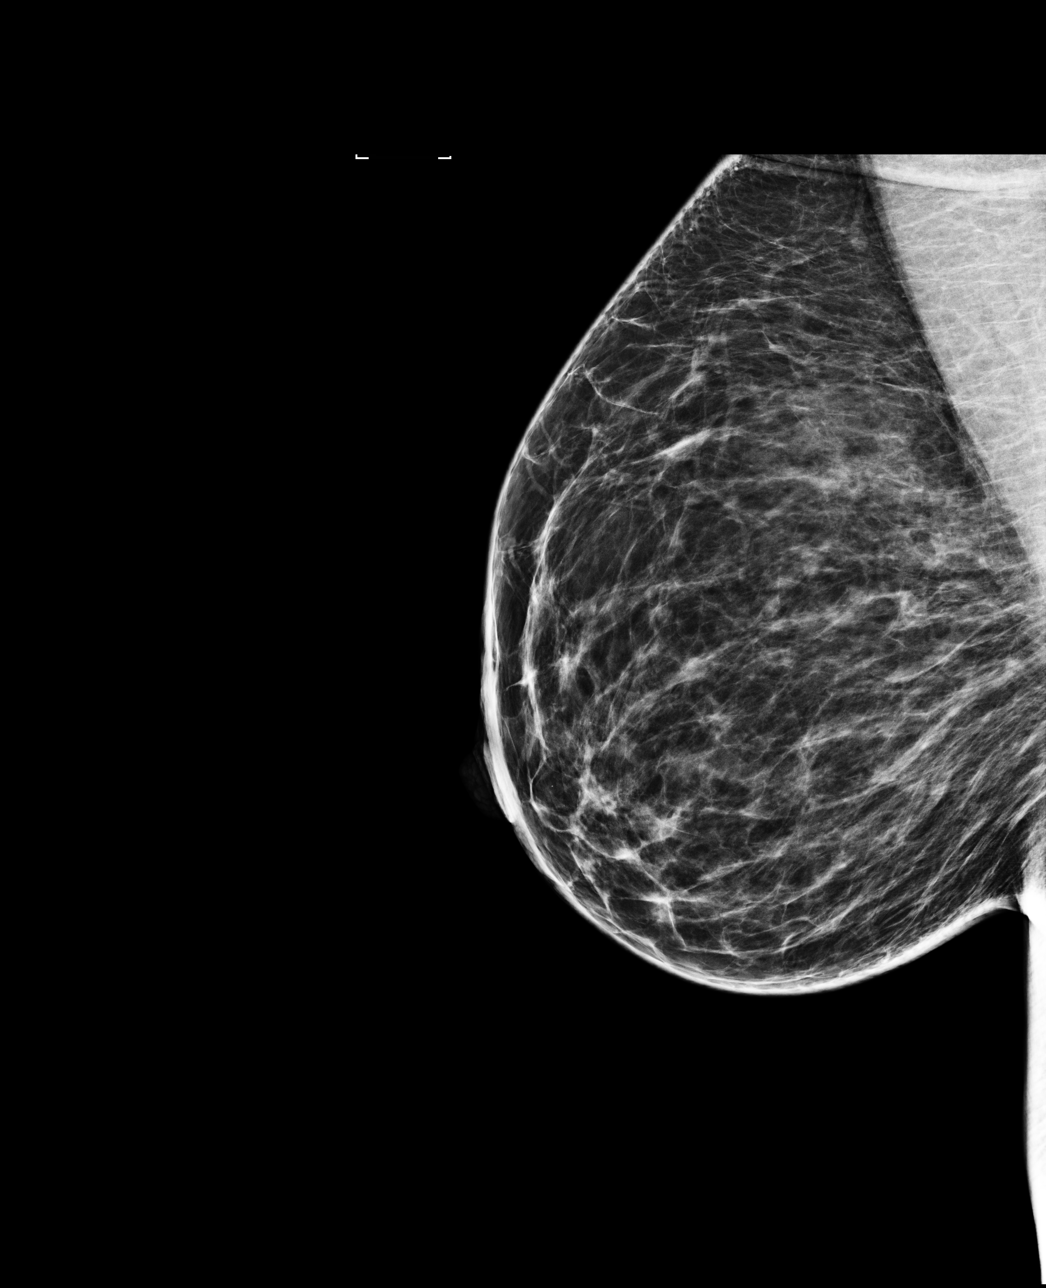

[L MLO]
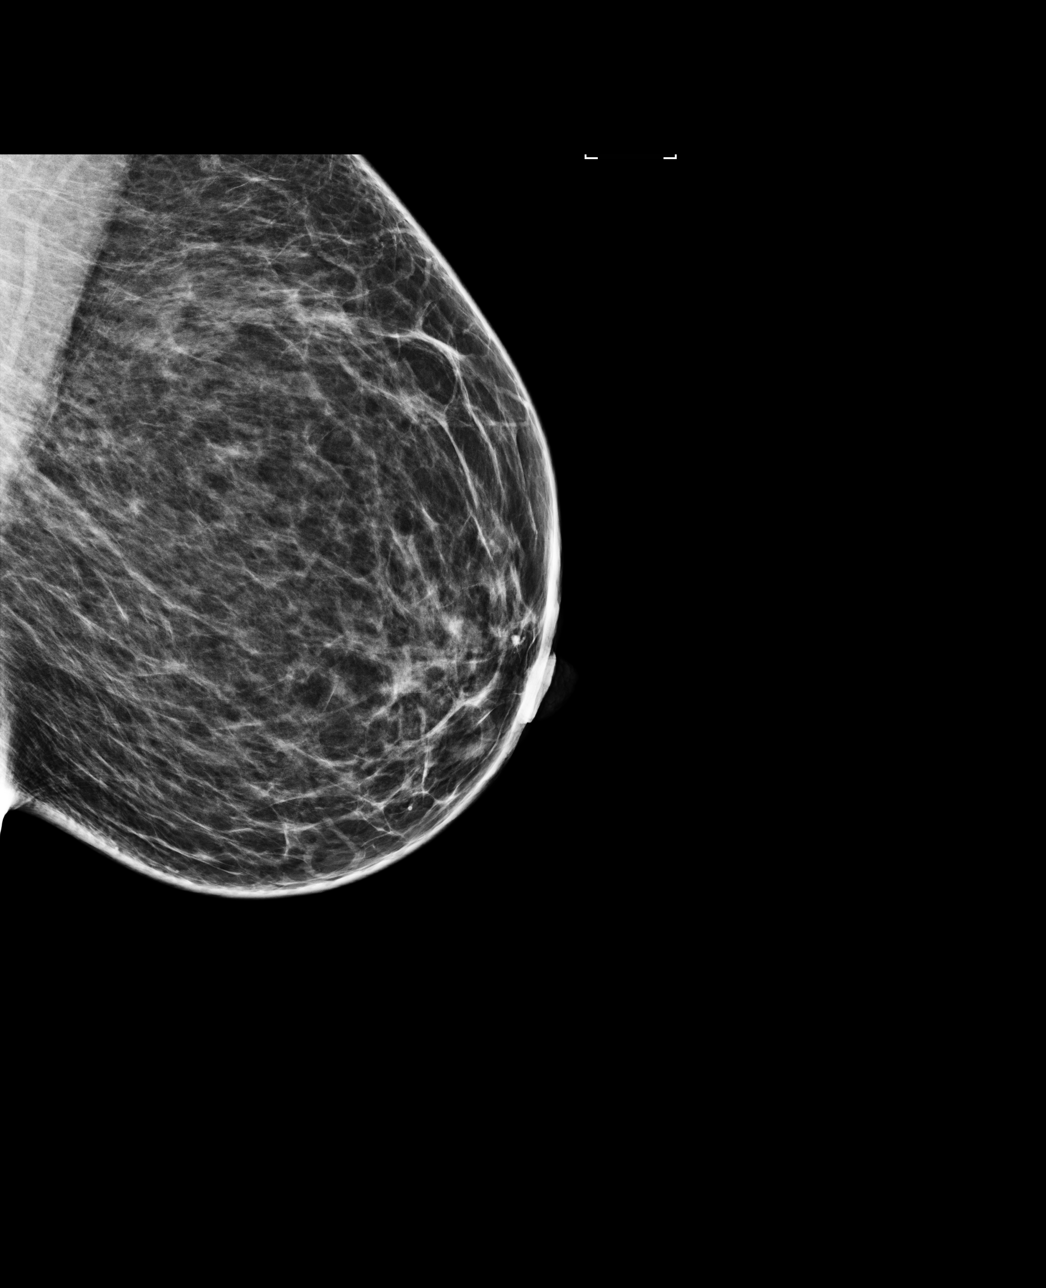

[R CC]
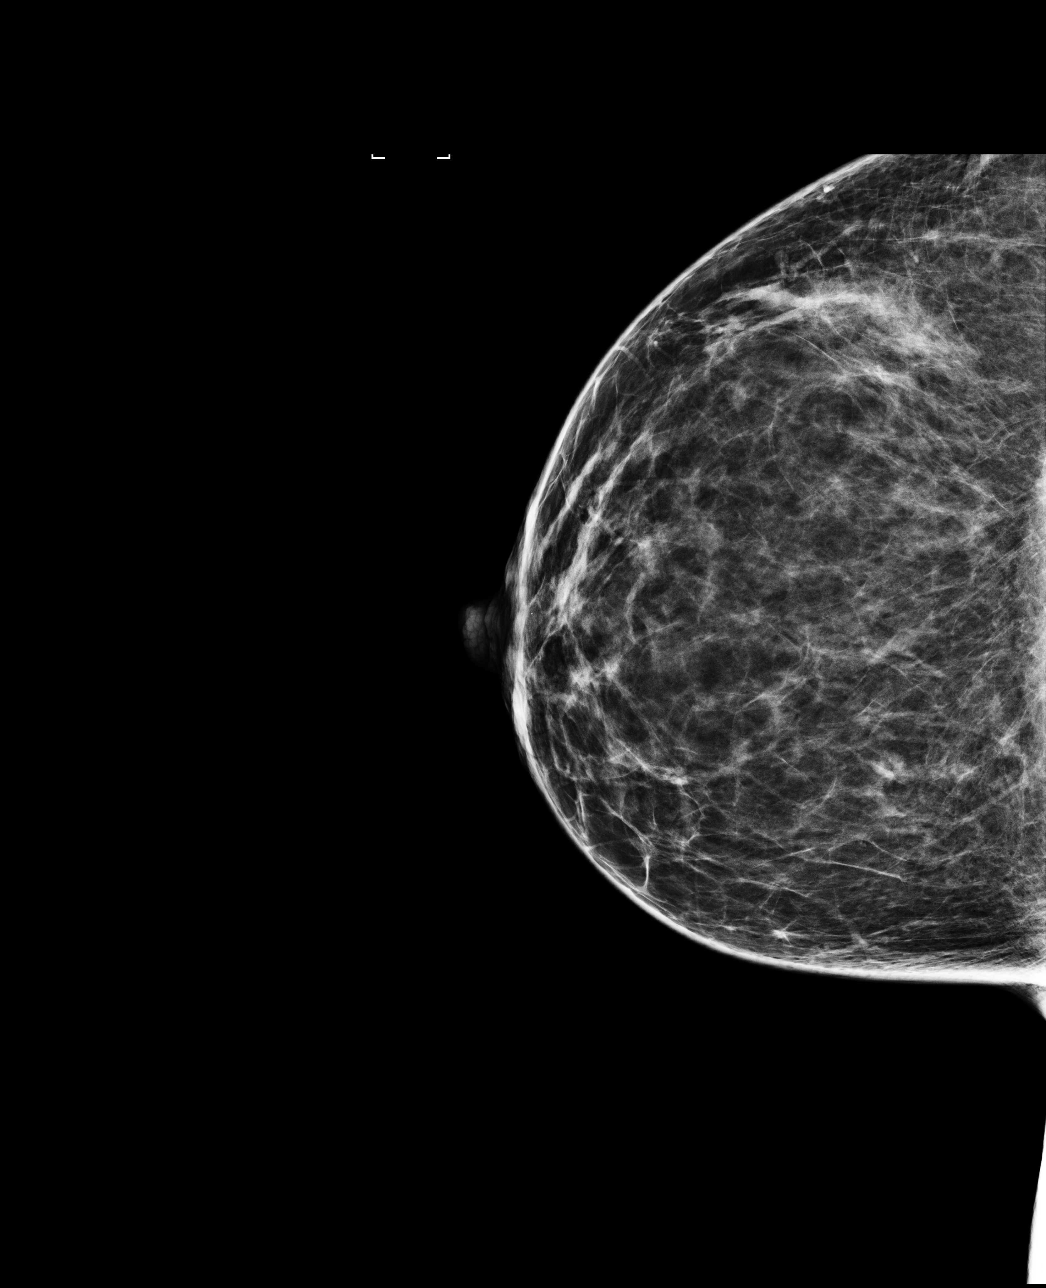

[4 of 4 positions shown; findings below may reference images not displayed]

ACR Breast Density Category b: There are scattered areas of
fibroglandular density.
FINDINGS: There are no findings suspicious for malignancy. Images were
processed with CAD.
IMPRESSION: No mammographic evidence of malignancy. A result letter of this
screening mammogram will be mailed directly to the patient.

RECOMMENDATION:
Screening mammogram in one year. (Code:[id number])

BI-RADS CATEGORY  1: Negative.

## 2020-11-02 ENCOUNTER — Other Ambulatory Visit: Payer: Self-pay

## 2020-11-02 ENCOUNTER — Ambulatory Visit (INDEPENDENT_AMBULATORY_CARE_PROVIDER_SITE_OTHER): Payer: Medicaid Other | Admitting: *Deleted

## 2020-11-02 DIAGNOSIS — Z23 Encounter for immunization: Secondary | ICD-10-CM

## 2021-04-01 NOTE — Progress Notes (Signed)
° ° °  SUBJECTIVE:   Chief compliant/HPI: annual examination  Chelsea Henson is a 46 y.o. who presents today for an annual exam.   Current concerns   Smoking Patient smokes approximately 5 to 6 cigarettes a day.  She smokes usually in the mornings and the evenings.  She does not smoke at work and does not feel the cravings for when she is at work.  She feels mostly the need to do it when she is stressed at home.  He is interested in quitting smoking.  She has been able to quit smoking for 6 to 7 months in the past without any additional medications.  Review of systems negative.   Updated history tabs and problem list.  OBJECTIVE:   BP 122/78    Pulse 74    Wt 227 lb 9.6 oz (103.2 kg)    SpO2 99%    BMI 37.87 kg/m    General: Alert, no acute distress Cardio: Normal S1 and S2, RRR, no r/m/g Pulm: CTAB, normal work of breathing Abdomen: Bowel sounds normal. Abdomen soft and non-tender.  Extremities: No peripheral edema.  Neuro: Cranial nerves grossly intact   ASSESSMENT/PLAN:   Prediabetes Obtained A1c today.  Tobacco abuse Discussed her tobacco use counseled patient on increased risk of cardiovascular disease, hypertension stroke etc. She is in the action stage.  She has successfully quit for 6 to 7 months at a time without medication.  Offered patient nicotine patch to help with cravings she would like to try these.  Sent 7 mg nicotine patch to the pharmacy.  In the future can try lozenges, Wellbutrin, Chantix etc. Recommended follow-up in 1 month.   Annual Examination  See AVS for age appropriate recommendations.   PHQ score 1, reviewed and discussed. Blood pressure reviewed and at goal.  Asked about intimate partner violence and patient reports none.  The patient has BLT.  She is not currently sexually active and no additional birth control needed Advanced directives did not discuss  Considered the following items based upon USPSTF recommendations: HIV testing:  ordered Hepatitis C: ordered Hepatitis B: ordered Syphilis if at high risk: ordered GC/CT not at high risk and not ordered. Lipid panel (nonfasting or fasting) discussed based upon AHA recommendations and ordered.  Consider repeat every 4-6 years.  Reviewed risk factors for latent tuberculosis and not indicated  Discussed family history, BRCA testing not indicated.  Cervical cancer screening: prior Pap reviewed, repeat due in 2024 Immunizations-flu and vaccine UTD   Follow up in 1  year or sooner if indicated.    Chelsea Haw, MD [place]

## 2021-04-02 ENCOUNTER — Encounter: Payer: Self-pay | Admitting: Family Medicine

## 2021-04-02 ENCOUNTER — Ambulatory Visit (INDEPENDENT_AMBULATORY_CARE_PROVIDER_SITE_OTHER): Payer: Medicaid Other | Admitting: Family Medicine

## 2021-04-02 ENCOUNTER — Other Ambulatory Visit: Payer: Self-pay

## 2021-04-02 VITALS — BP 122/78 | HR 74 | Wt 227.6 lb

## 2021-04-02 DIAGNOSIS — Z131 Encounter for screening for diabetes mellitus: Secondary | ICD-10-CM | POA: Diagnosis not present

## 2021-04-02 DIAGNOSIS — Z1211 Encounter for screening for malignant neoplasm of colon: Secondary | ICD-10-CM

## 2021-04-02 DIAGNOSIS — Z13220 Encounter for screening for lipoid disorders: Secondary | ICD-10-CM

## 2021-04-02 DIAGNOSIS — R7303 Prediabetes: Secondary | ICD-10-CM

## 2021-04-02 DIAGNOSIS — Z1231 Encounter for screening mammogram for malignant neoplasm of breast: Secondary | ICD-10-CM | POA: Diagnosis not present

## 2021-04-02 DIAGNOSIS — Z113 Encounter for screening for infections with a predominantly sexual mode of transmission: Secondary | ICD-10-CM

## 2021-04-02 DIAGNOSIS — Z720 Tobacco use: Secondary | ICD-10-CM | POA: Diagnosis not present

## 2021-04-02 LAB — POCT GLYCOSYLATED HEMOGLOBIN (HGB A1C): HbA1c, POC (prediabetic range): 6.1 % (ref 5.7–6.4)

## 2021-04-02 MED ORDER — NICOTINE 7 MG/24HR TD PT24: 7.0000 mg | MEDICATED_PATCH | Freq: Every day | TRANSDERMAL | 0 refills | Status: AC

## 2021-04-02 NOTE — Assessment & Plan Note (Addendum)
Discussed her tobacco use counseled patient on increased risk of cardiovascular disease, hypertension stroke etc. She is in the action stage.  She has successfully quit for 6 to 7 months at a time without medication.  Offered patient nicotine patch to help with cravings she would like to try these.  Sent 7 mg nicotine patch to the pharmacy.  In the future can try lozenges, Wellbutrin, Chantix etc. Recommended follow-up in 1 month.

## 2021-04-02 NOTE — Assessment & Plan Note (Signed)
Obtained A1c today.

## 2021-04-02 NOTE — Patient Instructions (Signed)
Thank you for coming to see me today. It was a pleasure.  We will get some labs today.  If they are abnormal or we need to do something about them, I will call you.  If they are normal, I will send you a message on MyChart (if it is active) or a letter in the mail.  If you don't hear from [place] in 2 weeks, please call the office at the number below.  We performed STD testing today. This will take a few days to come back. If your MyChart is activated, we will message you on there if everything is normal, otherwise we will call. If we need to treat something we will also call you. If you do not hear from [place] in the next 4 days, please give [place] a call.   Please follow-up with me for smoking check up   If you have any questions or concerns, please do not hesitate to call the office at [phone].  Best wishes,   Dr Posey Pronto

## 2021-04-04 LAB — LIPID PANEL
Chol/HDL Ratio: 4.5 ratio — ABNORMAL HIGH (ref 0.0–4.4)
Cholesterol, Total: 140 mg/dL (ref 100–199)
HDL: 31 mg/dL — ABNORMAL LOW (ref >39)
LDL Chol Calc (NIH): 88 mg/dL (ref 0–99)
Triglycerides: 117 mg/dL (ref 0–149)
VLDL Cholesterol Cal: 21 mg/dL (ref 5–40)

## 2021-04-04 LAB — HIV ANTIBODY (ROUTINE TESTING W REFLEX): HIV Screen 4th Generation wRfx: NONREACTIVE

## 2021-04-04 LAB — HEPATITIS C ANTIBODY: Hep C Virus Ab: NONREACTIVE

## 2021-04-04 LAB — HEPATITIS B SURFACE ANTIBODY,QUALITATIVE: Hep B Surface Ab, Qual: REACTIVE

## 2021-04-04 LAB — RPR: RPR Ser Ql: NONREACTIVE

## 2021-05-04 DIAGNOSIS — J029 Acute pharyngitis, unspecified: Secondary | ICD-10-CM | POA: Diagnosis not present

## 2021-05-04 DIAGNOSIS — Z20828 Contact with and (suspected) exposure to other viral communicable diseases: Secondary | ICD-10-CM | POA: Diagnosis not present

## 2021-05-04 DIAGNOSIS — J039 Acute tonsillitis, unspecified: Secondary | ICD-10-CM | POA: Diagnosis not present

## 2021-05-07 ENCOUNTER — Ambulatory Visit: Payer: Medicaid Other

## 2021-05-07 ENCOUNTER — Ambulatory Visit
Admission: RE | Admit: 2021-05-07 | Discharge: 2021-05-07 | Disposition: A | Discharge status: Home / Self Care | Payer: Medicaid Other | Source: Ambulatory Visit | Attending: Family Medicine | Admitting: Family Medicine

## 2021-05-07 DIAGNOSIS — Z1231 Encounter for screening mammogram for malignant neoplasm of breast: Secondary | ICD-10-CM

## 2021-05-10 ENCOUNTER — Other Ambulatory Visit: Payer: Self-pay | Admitting: Family Medicine

## 2021-05-10 DIAGNOSIS — R928 Other abnormal and inconclusive findings on diagnostic imaging of breast: Secondary | ICD-10-CM

## 2021-05-15 DIAGNOSIS — Z299 Encounter for prophylactic measures, unspecified: Secondary | ICD-10-CM | POA: Diagnosis not present

## 2021-05-15 DIAGNOSIS — J029 Acute pharyngitis, unspecified: Secondary | ICD-10-CM | POA: Diagnosis not present

## 2021-05-28 ENCOUNTER — Other Ambulatory Visit: Payer: Self-pay | Admitting: Allergy

## 2021-06-01 ENCOUNTER — Other Ambulatory Visit: Payer: Self-pay | Admitting: Allergy

## 2021-06-04 ENCOUNTER — Ambulatory Visit (INDEPENDENT_AMBULATORY_CARE_PROVIDER_SITE_OTHER): Payer: Medicaid Other | Admitting: Family Medicine

## 2021-06-04 VITALS — BP 130/80 | HR 71 | Ht 65.0 in | Wt 227.6 lb

## 2021-06-04 DIAGNOSIS — J029 Acute pharyngitis, unspecified: Secondary | ICD-10-CM | POA: Insufficient documentation

## 2021-06-04 MED ORDER — FLUTICASONE PROPIONATE 50 MCG/ACT NA SUSP: 2.0000 | Freq: Every day | NASAL | 6 refills | Status: DC

## 2021-06-04 MED ORDER — LEVOCETIRIZINE DIHYDROCHLORIDE 5 MG PO TABS: 5.0000 mg | ORAL_TABLET | Freq: Every evening | ORAL | 5 refills | Status: DC

## 2021-06-04 NOTE — Assessment & Plan Note (Signed)
Subacute, approximately 1 month duration at this time.  No red flags or signs of secondary bacterial infection.  Patient otherwise well-appearing and well-hydrated.  Strep unlikely given initial rapid strep negative and her age, plus Augmentin course should have cured.  Viral pharyngitis less likely given that this would certainly be a protracted course.  Suspect allergic pharyngitis most likely given postnasal drip and eustachian tube dysfunction symptoms.  We will trial Flonase and Xyzal with follow-up in 1 to 2 weeks if no improvement.  If she returns for care, could consider oropharyngeal swab for infection such as GC/CH. ?

## 2021-06-04 NOTE — Patient Instructions (Addendum)
It was wonderful to meet you today. Thank you for allowing me to be a part of your care. Below is a short summary of what we discussed at your visit today: ? ?Allergic Pharyngitis ?No antibiotics needed at this time ?Start flonase one spray each nostril at bedtime ?Start xyzal (or any second gen allergy med like zyrtec, allegra, claritin, xyzal) daily ?Okay to use neti-pot or other sinus flush ?Come back in 1.5-2 weeks if no improvement ? ? ?If you have any questions or concerns, please do not hesitate to contact us via phone or MyChart message.  ? ?Ezequiel Essex, MD  ? ?Community Resources Everyone Should Know About:  ? ?Cooking and Nutrition Classes ?The  [employer] in [place] provides many classes at low or no cost to [employer], nutrition, and agriculture.  Their website offers a huge variety of information related to topics such as gardening, nutrition, cooking, parenting, and health.  Also listed are classes and events, both online and in-person.  Check out their website here: [email or web address]://[email or web address].is  ? ?Food finder app ?Download the [employer] App or Call 211 to easily find food banks and pantries and other resources nearby.  ?

## 2021-06-04 NOTE — Progress Notes (Signed)
    SUBJECTIVE:   CHIEF COMPLAINT / HPI:   Persistent pharyngitis Patient complains of continued sore throat, dry mouth, white spots on back of throat, and metallic mouth taste.  She was recently seen in urgent care about a month ago on 3/28.  At that time she tested negative for strep, flu A, and flu B.  She was given a Medrol pack and sent home.  She reports this improved her sore throat somewhat but it came back.  She was seen again on 4/08 at urgent care and was then prescribed 7 day course of Augmentin given concern for secondary bacterial infection.  Patient reports this also helped somewhat but now her throat is hurting again.  She describes dry mouth, sore throat, metallic mouth taste, and persistent odd smell.  She also has a little bit of rhinorrhea and postnasal drip with subsequent cough.  Denies shortness of breath, chest congestion, fever, N/V/D/C.  Normal p.o. intake and UOP.  Has been using Motrin, Tylenol, and Benadryl with minimal relief.  PERTINENT  PMH / PSH: Hirsutism, AUB, anxious mood, depressed mood, fibroids, IDA, prediabetes, tobacco use  OBJECTIVE:   BP 130/80   Pulse 71   Ht '5\' 5"'$  (1.651 m)   Wt 227 lb 9.6 oz (103.2 kg)   SpO2 97%   BMI 37.87 kg/m    PHQ-9:     04/02/2021    9:14 AM 02/21/2020    9:45 AM 11/15/2019    8:56 AM  Depression screen PHQ 2/9  Decreased Interest 0 0 0  Down, Depressed, Hopeless 1 0 0  PHQ - 2 Score 1 0 0  Altered sleeping '1 3 3  '$ Tired, decreased energy '1 1 2  '$ Change in appetite 1 0 0  Feeling bad or failure about yourself  0 0 0  Trouble concentrating '1 1 2  '$ Moving slowly or fidgety/restless 0 0 0  Suicidal thoughts 0 0 0  PHQ-9 Score '5 5 7  '$ Difficult doing work/chores   Somewhat difficult    Physical Exam General: Awake, alert, oriented HEENT: PERRL, no conjunctivitis or scleral icterus present, bilateral TM pearly pink and flat, bilateral external auditory canals with minimal cerumen burden, bilateral edematous  turbinates, oral mucosa pink, moist, without lesion, intact dentition without obvious cavity, normal size tonsils without exudate or plaque, oropharynx nonerythematous Lymph: No palpable lymphedema of head or neck Cardiovascular: Regular rate and rhythm, S1 and S2 present, no murmurs auscultated Respiratory: Lung fields clear to auscultation bilaterally  ASSESSMENT/PLAN:   Pharyngitis Subacute, approximately 1 month duration at this time.  No red flags or signs of secondary bacterial infection.  Patient otherwise well-appearing and well-hydrated.  Strep unlikely given initial rapid strep negative and her age, plus Augmentin course should have cured.  Viral pharyngitis less likely given that this would certainly be a protracted course.  Suspect allergic pharyngitis most likely given postnasal drip and eustachian tube dysfunction symptoms.  We will trial Flonase and Xyzal with follow-up in 1 to 2 weeks if no improvement.  If she returns for care, could consider oropharyngeal swab for infection such as GC/CH.     Ezequiel Essex, MD [place]

## 2021-06-11 ENCOUNTER — Ambulatory Visit
Admission: RE | Admit: 2021-06-11 | Discharge: 2021-06-11 | Disposition: A | Discharge status: Home / Self Care | Payer: Medicaid Other | Source: Ambulatory Visit | Attending: Family Medicine | Admitting: Family Medicine

## 2021-06-11 ENCOUNTER — Other Ambulatory Visit: Payer: Self-pay | Admitting: Family Medicine

## 2021-06-11 DIAGNOSIS — R928 Other abnormal and inconclusive findings on diagnostic imaging of breast: Secondary | ICD-10-CM

## 2021-06-11 DIAGNOSIS — N6489 Other specified disorders of breast: Secondary | ICD-10-CM | POA: Diagnosis not present

## 2021-06-15 ENCOUNTER — Encounter: Payer: Self-pay | Admitting: Family Medicine

## 2021-06-15 ENCOUNTER — Ambulatory Visit (INDEPENDENT_AMBULATORY_CARE_PROVIDER_SITE_OTHER): Payer: Medicaid Other | Admitting: Family Medicine

## 2021-06-15 DIAGNOSIS — J309 Allergic rhinitis, unspecified: Secondary | ICD-10-CM | POA: Insufficient documentation

## 2021-06-15 DIAGNOSIS — J3089 Other allergic rhinitis: Secondary | ICD-10-CM | POA: Diagnosis not present

## 2021-06-15 NOTE — Patient Instructions (Signed)
Good to see you today - Thank you for coming in ? ?Things we discussed today: ? ?Sore Throat - I think is allergies ?Take the xyzal and the flonase every day ?If not back to normal in 2 weeks come back ? ?You need to stop smoking! ? Call [place] (1-800-QUIT-NOW) for ideas ? Choose a quit date when you will stop completely. ? Get prepared by slowly cutting down. ? Find a substitute to use when you think you need a cigarette. ? If you wish to discuss nicotine replacement (patches, gum) please make an appointment  ? ?Make an appointment for a full physical with Dr Larae Grooms in 1-2 months  ?

## 2021-06-15 NOTE — Assessment & Plan Note (Signed)
Symptoms consistent with allergic rhinitis.  No signs of bacterial or chronic viral infection.  Suggest regular use of antihistamine and nasal steroid and follow up for full physical  ?

## 2021-06-15 NOTE — Progress Notes (Signed)
? ? ?  SUBJECTIVE:  ? ?CHIEF COMPLAINT / HPI:  ? ?Sore Throat Congestion  ?Has had at least 3 episodes of sore throat, sneezing, achiness that seemed to improve with steroids and antihistamines, nasal steroids.  She stops these after a few days.   Had another episode 2 days ago that is better now after restarting xyzal. ?No fever or rash or facial pain or weight loss or adenopathy or recent sexual activity. HIV negative in March ? ?PERTINENT  PMH / PSH: no chronic medications. Does take benadryl sometimes for allergies ? ?OBJECTIVE:  ? ?BP (!) 127/97   Pulse 67   Wt 225 lb 6.4 oz (102.2 kg)   LMP 05/01/2019   SpO2 99%   BMI 37.51 kg/m?   ?Alert nad ?Heart - Regular rate and rhythm.  No murmurs, gallops or rubs.    ?Lungs:  Normal respiratory effort, chest expands symmetrically. Lungs are clear to auscultation, no crackles or wheezes. ?Skin:  Intact without suspicious lesions or rashes ?Neck:  No deformities, thyromegaly, masses, or tenderness noted.   Supple with full range of motion without pain. ?Throat - slightly red without exudate  ?MM - moist ?No axillary adenopathy  ? ?ASSESSMENT/PLAN:  ? ?Allergic rhinitis ?Symptoms consistent with allergic rhinitis.  No signs of bacterial or chronic viral infection.  Suggest regular use of antihistamine and nasal steroid and follow up for full physical  ?  ?Patient Instructions  ?Good to see you today - Thank you for coming in ? ?Things we discussed today: ? ?Sore Throat - I think is allergies ?Take the xyzal and the flonase every day ?If not back to normal in 2 weeks come back ? ?You need to stop smoking! ? Call [place] (1-800-QUIT-NOW) for ideas ? Choose a quit date when you will stop completely. ? Get prepared by slowly cutting down. ? Find a substitute to use when you think you need a cigarette. ? If you wish to discuss nicotine replacement (patches, gum) please make an appointment  ? ?Make an appointment for a full physical with Dr Larae Grooms in 1-2  months  ? ? ?Chelsea Covert, MD ?[place]  ?

## 2021-07-13 ENCOUNTER — Encounter: Payer: Self-pay | Admitting: *Deleted

## 2021-07-19 ENCOUNTER — Telehealth: Payer: Self-pay | Admitting: *Deleted

## 2021-07-19 NOTE — Telephone Encounter (Signed)
Received voicemail from patient requesting a referral for ENT.  Will forward to MD.  Chelsea Henson

## 2021-07-22 NOTE — Telephone Encounter (Signed)
Attempted to reach patient. No answer. LVM  for patient to call the office to make follow up appt with PCP. Timithy Arons, CMA  

## 2021-07-29 NOTE — Telephone Encounter (Signed)
Patient has appoint on 6/23 with Dr. Zigmund Daniel. Salvatore Marvel, CMA

## 2021-07-30 ENCOUNTER — Ambulatory Visit (INDEPENDENT_AMBULATORY_CARE_PROVIDER_SITE_OTHER): Payer: Medicaid Other | Admitting: Student

## 2021-07-30 ENCOUNTER — Encounter: Payer: Self-pay | Admitting: Student

## 2021-07-30 ENCOUNTER — Encounter: Payer: Self-pay | Admitting: Gastroenterology

## 2021-07-30 VITALS — BP 127/84 | HR 66 | Ht 65.0 in | Wt 222.4 lb

## 2021-07-30 DIAGNOSIS — E049 Nontoxic goiter, unspecified: Secondary | ICD-10-CM | POA: Diagnosis not present

## 2021-07-30 DIAGNOSIS — R0683 Snoring: Secondary | ICD-10-CM | POA: Diagnosis not present

## 2021-07-31 LAB — T4F: T4,Free (Direct): 1.72 ng/dL (ref 0.82–1.77)

## 2021-07-31 LAB — TSH RFX ON ABNORMAL TO FREE T4: TSH: 0.433 u[IU]/mL — ABNORMAL LOW (ref 0.450–4.500)

## 2021-08-06 ENCOUNTER — Ambulatory Visit
Admission: RE | Admit: 2021-08-06 | Discharge: 2021-08-06 | Disposition: A | Discharge status: Home / Self Care | Payer: Medicaid Other | Source: Ambulatory Visit | Attending: Family Medicine | Admitting: Family Medicine

## 2021-08-06 ENCOUNTER — Encounter: Payer: Self-pay | Admitting: Student

## 2021-08-06 DIAGNOSIS — E041 Nontoxic single thyroid nodule: Secondary | ICD-10-CM | POA: Diagnosis not present

## 2021-08-06 DIAGNOSIS — E049 Nontoxic goiter, unspecified: Secondary | ICD-10-CM

## 2021-08-23 ENCOUNTER — Telehealth: Payer: Self-pay

## 2021-08-23 NOTE — Telephone Encounter (Signed)
Patient calls nurse line regarding results from thyroid US. Patient reports that she does not use mychart and has not heard anything regarding results.   Informed of results per note from Dr. Zigmund Daniel.   Answered all questions.   Patient will follow up at the end of September.   Talbot Grumbling, RN

## 2021-08-27 ENCOUNTER — Ambulatory Visit (AMBULATORY_SURGERY_CENTER): Payer: Self-pay | Admitting: *Deleted

## 2021-08-27 VITALS — Ht 65.0 in | Wt 227.0 lb

## 2021-08-27 DIAGNOSIS — Z1211 Encounter for screening for malignant neoplasm of colon: Secondary | ICD-10-CM

## 2021-08-27 MED ORDER — NA SULFATE-K SULFATE-MG SULF 17.5-3.13-1.6 GM/177ML PO SOLN: 1.0000 | Freq: Once | ORAL | 0 refills | Status: AC

## 2021-08-27 NOTE — Progress Notes (Signed)
No egg or soy allergy known to patient  No issues known to pt with past sedation with any surgeries or procedures Patient denies ever being told they had issues or difficulty with intubation  No FH of Malignant Hyperthermia Pt is not on diet pills Pt is not on  home 02  Pt is not on blood thinners  Pt denies issues with constipation  No A fib or A flutter Have any cardiac testing pending--PT DENIES

## 2021-09-17 ENCOUNTER — Encounter: Payer: Medicaid Other | Admitting: Gastroenterology

## 2021-09-22 ENCOUNTER — Ambulatory Visit (INDEPENDENT_AMBULATORY_CARE_PROVIDER_SITE_OTHER): Payer: Medicaid Other | Admitting: Neurology

## 2021-09-22 ENCOUNTER — Encounter: Payer: Self-pay | Admitting: Neurology

## 2021-09-22 VITALS — BP 118/92 | HR 65 | Ht 65.0 in | Wt 228.0 lb

## 2021-09-22 DIAGNOSIS — R7303 Prediabetes: Secondary | ICD-10-CM

## 2021-09-22 DIAGNOSIS — G2581 Restless legs syndrome: Secondary | ICD-10-CM

## 2021-09-22 DIAGNOSIS — E66812 Obesity, class 2: Secondary | ICD-10-CM

## 2021-09-22 DIAGNOSIS — G4719 Other hypersomnia: Secondary | ICD-10-CM | POA: Diagnosis not present

## 2021-09-22 DIAGNOSIS — R252 Cramp and spasm: Secondary | ICD-10-CM | POA: Diagnosis not present

## 2021-09-22 DIAGNOSIS — R0683 Snoring: Secondary | ICD-10-CM

## 2021-09-22 DIAGNOSIS — F519 Sleep disorder not due to a substance or known physiological condition, unspecified: Secondary | ICD-10-CM

## 2021-09-22 DIAGNOSIS — Z6837 Body mass index (BMI) 37.0-37.9, adult: Secondary | ICD-10-CM | POA: Diagnosis not present

## 2021-09-22 DIAGNOSIS — R351 Nocturia: Secondary | ICD-10-CM

## 2021-09-22 DIAGNOSIS — R799 Abnormal finding of blood chemistry, unspecified: Secondary | ICD-10-CM | POA: Diagnosis not present

## 2021-09-22 NOTE — Progress Notes (Signed)
SLEEP MEDICINE CLINIC    Provider:  Larey Seat, MD  Primary Care Physician:  Donney Dice, DO [place] [address]     Referring Provider: Acquanetta Sit, Md [address] [place],  [place] [address]          Chief Complaint according to patient   Patient presents with:     New Patient (Initial Visit)     Referred by McDiarmid, Blane Ohara, MD Reason for Visit      HISTORY OF PRESENT ILLNESS:  Chelsea Henson is a 46 y.o. African American female patient seen here as a referral on 09/22/2021 from Dr Zigmund Daniel  for a sleep medicine consultation..  Chief concern according to patient :"  I wake up with a parched mouth, snore loudly and my kids have complaint. I am tired tired and sleepy all day , even after waking up in the morning. I can fall asleep anytime , anywhere. I can't finish a movie, I am off" - she proceeded to show my a video: Loud snoring. Witnessed apneas- "I gained weight since Covid and during Covid and that may have contributed, at least 15 pounds ". She just quit smoking!    Chelsea Henson has a past medical history of Abnormal uterine bleeding, Allergy, Chest pain of uncertain etiology (66/59/9357), Enlarged thyroid, Fibroids, Prediabetes, Recurrent vaginitis (01/23/2019), Seasonal allergies, Snores, and Tobacco abuse.     Sleep relevant medical history: Nocturia every 1-2 hours of sleep( 5-6-7 times ) , Thyroid is enlarged. deviated septum repair? UPPP?    Family medical /sleep history: night terrors in one son, grandson with enuresis, sleep walking,  no other family member on CPAP with OSA, suspects her brother to have it.  Social history:  Patient is working as Art therapist and lives in a household with 2 children , her grandson ( 55) through her 63 year old son.  her partner is only every 2 weeks at home. Mother of  3 children, .  The patient currently works daytime . Tobacco use- just quit .   ETOH use ; 1-2 glasses a  week,  Caffeine intake in form of Coffee( in winter ) Soda( /) Tea ( lunch , dinner ) no energy drinks. Regular exercise- none .  Swimming in summer.  Hobbies : none     Sleep habits are as follows: The patient's dinner time is between 5-7 PM. The patient goes to bed at 10-11 PM and continues to sleep for short intervals of 1-2 hours, wakes for 5 plus  bathroom breaks, the first time at 12 AM. Bedroom is cool, quiet and dark.   The preferred sleep position is laterally, with the support of 1-2 pillows. She often feels hot , sweat, wakes up- Dreams are reportedly frequent/vivid. She reports body aches and stiffness.  5  AM is the usual rise time. The patient wakes up at 4.30 with an alarm, which she snoozes. .  She reports not feeling refreshed or restored in AM, with symptoms such as dry mouth(!), morning headaches(!), and residual fatigue.  Naps are taken infrequently, usually on WE, lasting from 20 to 120 minutes and are more refreshing.    Review of Systems: Out of a complete 14 system review, the patient complains of only the following symptoms, and all other reviewed systems are negative.:  Fatigue, sleepiness , snoring, fragmented sleep, NOCTURIA perimenopause, diaphoresis.    How likely are you to doze in the following  situations: 0 = not likely, 1 = slight chance, 2 = moderate chance, 3 = high chance   Sitting and Reading? Watching Television? Sitting inactive in a public place (theater or meeting)? As a passenger in a car for an hour without a break? Lying down in the afternoon when circumstances permit? Sitting and talking to someone? Sitting quietly after lunch without alcohol? In a car, while stopped for a few minutes in traffic?   Total = 18/ 24 points   FSS endorsed at 52/ 63 points.   Depression - positive PHQR 9 points.   Social History   Socioeconomic History   Marital status: Married    Spouse name: Not on file   Number of children: Not on file   Years of  education: Not on file   Highest education level: Not on file  Occupational History   Not on file  Tobacco Use   Smoking status: Some Days    Packs/day: 0.25    Types: Cigarettes   Smokeless tobacco: Never  Substance and Sexual Activity   Alcohol use: Yes    Comment: occ   Drug use: Yes    Types: Marijuana   Sexual activity: Not on file  Other Topics Concern   Not on file  Social History Narrative   Not on file   Social Determinants of Health   Financial Resource Strain: Not on file  Food Insecurity: Not on file  Transportation Needs: Not on file  Physical Activity: Not on file  Stress: Not on file  Social Connections: Not on file    Family History  Problem Relation Age of Onset   Colon polyps Mother    Hypertension Mother    Diabetes Sister    Depression Paternal Grandmother    Allergic rhinitis Neg Hx    Angioedema Neg Hx    Asthma Neg Hx    Eczema Neg Hx    Immunodeficiency Neg Hx    Urticaria Neg Hx    Colon cancer Neg Hx    Esophageal cancer Neg Hx    Rectal cancer Neg Hx    Stomach cancer Neg Hx     Past Medical History:  Diagnosis Date   Abnormal uterine bleeding    Allergy    Bacterial vaginosis    Chest pain of uncertain etiology 60/73/7106   Enlarged thyroid    Fibroids    Prediabetes    Recurrent vaginitis 01/23/2019   Seasonal allergies    Snores    Tobacco abuse     Past Surgical History:  Procedure Laterality Date   CESAREAN SECTION     X3   DILATATION & CURETTAGE/HYSTEROSCOPY WITH MYOSURE N/A 05/22/2019   Procedure: DILATATION & CURETTAGE/HYSTEROSCOPY WITH MYOSURE;  Surgeon: Osborne Oman, MD;  Location: [facility];  Service: Gynecology;  Laterality: N/A;   HYSTEROSCOPY N/A 05/22/2019   Procedure: Hysteroscopy with Hydrothermal Ablation ;  Surgeon: Osborne Oman, MD;  Location: [facility];  Service: Gynecology;  Laterality: N/A;   TUBAL LIGATION       Current Outpatient Medications on File  Prior to Visit  Medication Sig Dispense Refill   acetaminophen (TYLENOL) 500 MG tablet Take 500 mg by mouth daily as needed (pain).     diphenhydrAMINE (BENADRYL) 25 MG tablet Take 25 mg by mouth daily as needed for itching.     fluticasone (FLONASE) 50 MCG/ACT nasal spray Place into both nostrils as needed for allergies or rhinitis.     levocetirizine (  XYZAL) 5 MG tablet Take 1 tablet (5 mg total) by mouth every evening. 30 tablet 5   No current facility-administered medications on file prior to visit.    Physical exam:  Today's Vitals   09/22/21 1520  BP: (!) 118/92  Pulse: 65  Weight: 228 lb (103.4 kg)  Height: '5\' 5"'$  (1.651 m)   Body mass index is 37.94 kg/m.   Wt Readings from Last 3 Encounters:  09/22/21 228 lb (103.4 kg)  08/27/21 227 lb (103 kg)  07/30/21 222 lb 6.4 oz (100.9 kg)     Ht Readings from Last 3 Encounters:  09/22/21 '5\' 5"'$  (1.651 m)  08/27/21 '5\' 5"'$  (1.651 m)  07/30/21 '5\' 5"'$  (1.651 m)      General: The patient is awake, alert and appears not in acute distress. The patient is well groomed. Head: Normocephalic, atraumatic. Neck is supple. Mallampati 1-2, uvula stuck to left tonsil.  neck circumference:15 inches . Nasal airflow  patent.  Retrognathia is  seen.  Dental status: crowded.  Cardiovascular:  Regular rate and cardiac rhythm by pulse,  without distended neck veins. Respiratory: Lungs are clear to auscultation.  Skin:  Without evidence of ankle edema, or rash. Trunk: The patient's posture is erect.   Neurologic exam : The patient is awake and alert, oriented to place and time.   Memory subjective described as intact.  Attention span & concentration ability appears normal.  Speech is fluent,  without dysarthria, dysphonia or aphasia.  Mood and affect are appropriate.   Cranial nerves: no loss of smell or taste reported  Pupils are equal and briskly reactive to light. Funduscopic exam deferred..  Extraocular movements in vertical and horizontal  planes were intact and without nystagmus. No Diplopia. Visual fields by finger perimetry are intact. Hearing was intact to soft voice and finger rubbing.    Facial sensation intact to fine touch.  Facial motor strength is symmetric and tongue and uvula move midline.  Neck ROM : rotation, tilt and flexion extension were normal for age and shoulder shrug was symmetrical.    Motor exam:  Symmetric bulk, tone and ROM.   Normal tone without cog wheeling, symmetric grip strength .   Sensory:  Fine touch, pinprick and vibration were tested  and  normal.  Proprioception tested in the upper extremities was normal.   Coordination: Rapid alternating movements in the fingers/hands were of normal speed.  The Finger-to-nose maneuver was intact without evidence of ataxia, dysmetria or tremor.   Gait and station: Patient could rise unassisted from a seated position, walked without assistive device.  Stance is of normal width/ base and the patient turned with 3 steps.  Toe and heel walk were deferred.  Deep tendon reflexes: in the  upper and lower extremities are symmetric and intact.  Babinski response was deferred .         After spending a total time of  45  minutes face to face and additional time for physical and neurologic examination, review of laboratory studies,  personal review of imaging studies, reports and results of other testing and review of referral information / records as far as provided in visit, I have established the following assessments:  1) excessive daytime sleepiness.  1b)This patient has had 2 main risk factors of OSA- retrognathia and obesity , BMI 37.  She is loudly snoring - brought a video recording, daughter witnessed apneas.  2) very frequent Nocturia-  3) leg cramping , often at rest. Some times in  both legs- will wake her up. History of low iron.  4) menopausal hot flushes, diaphoresis,    My Plan is to proceed with:  1) attended study for SPLIT protocol  preferred, this will be able to capture RLS, diaphoresis, palpitations- and allows for treatment. Sleep is fragmented for several reasons. Set Split to AHI 20/h. 2) Plan B - HST 3) daytime somnolence interferes with work and driving. As soon as we have a dx of apnea, will offer Modafinil.  4) weight loss program, menopausal. 5) ferritin TIBC.   I would like to thank Donney Dice, DO and Mcdiarmid, Blane Ohara, Md [address] [place],  [place] [address] for allowing me to meet with and to take care of this pleasant patient.   II plan to follow up either personally or through our NP after sleep study - usually 3 months. CC: I will share my notes with PCP.   Electronically signed by: Larey Seat, MD 09/22/2021 3:36 PM  [facility] and [facility] Sleep Board certified by The [employer] of Sleep Medicine and Diplomate of the [employer] of Sleep Medicine. Board certified In Neurology through the [employer], Fellow of the [employer] of Neurology. Medical Director of [employer].

## 2021-09-22 NOTE — Patient Instructions (Signed)
Restless Legs Syndrome Restless legs syndrome is a condition that causes uncomfortable feelings or sensations in the legs, especially while sitting or lying down. The sensations usually cause an overwhelming urge to move the legs. The arms can also sometimes be affected. The condition can range from mild to severe. The symptoms often interfere with a person's ability to sleep. What are the causes? The cause of this condition is not known. What increases the risk? The following factors may make you more likely to develop this condition: Being older than 50. Pregnancy. Being a woman. In general, the condition is more common in women than in men. A family history of the condition. Having iron deficiency. Overuse of caffeine, nicotine, or alcohol. Certain medical conditions, such as kidney disease, Parkinson's disease, or nerve damage. Certain medicines, such as those for high blood pressure, nausea, colds, allergies, depression, and some heart conditions. What are the signs or symptoms? The main symptom of this condition is uncomfortable sensations in the legs, such as: Pulling. Tingling. Prickling. Throbbing. Crawling. Burning. Usually, the sensations: Affect both sides of the body. Are worse when you sit or lie down. Are worse at night. These may make it difficult to fall asleep. Make you have a strong urge to move your legs. Are temporarily relieved by moving your legs or standing. The arms can also be affected, but this is rare. People who have this condition often have tiredness during the day because of their lack of sleep at night. How is this diagnosed? This condition may be diagnosed based on: Your symptoms. Blood tests. In some cases, you may be monitored in a sleep lab by a specialist (a sleep study). This can detect any disruptions in your sleep. How is this treated? This condition is treated by managing the symptoms. This may include: Lifestyle changes, such as  exercising, using relaxation techniques, and avoiding caffeine, alcohol, or tobacco. Iron supplements. Medicines. Parkinson's medications may be tried first. Anti-seizure medications can also be helpful. Follow these instructions at home: General instructions Take over-the-counter and prescription medicines only as told by your health care provider. Use methods to help relieve the uncomfortable sensations, such as: Massaging your legs. Walking or stretching. Taking a cold or hot bath. Keep all follow-up visits. This is important. Lifestyle     Practice good sleep habits. For example, go to bed and get up at the same time every day. Most adults should get 7-9 hours of sleep each night. Exercise regularly. Try to get at least 30 minutes of exercise most days of the week. Practice ways of relaxing, such as yoga or meditation. Avoid caffeine and alcohol. Do not use any products that contain nicotine or tobacco. These products include cigarettes, chewing tobacco, and vaping devices, such as e-cigarettes. If you need help quitting, ask your health care provider. Where to find more information National Institute of Neurological Disorders and Stroke: www.ninds.nih.gov Contact a health care provider if: Your symptoms get worse or they do not improve with treatment. Summary Restless legs syndrome is a condition that causes uncomfortable feelings or sensations in the legs, especially while sitting or lying down. The symptoms often interfere with your ability to sleep. This condition is treated by managing the symptoms. You may need to make lifestyle changes or take medicines. This information is not intended to replace advice given to you by your health care provider. Make sure you discuss any questions you have with your health care provider. Document Revised: 09/06/2020 Document Reviewed: 09/06/2020 Elsevier Patient Education    [place]. Sleep Apnea Sleep apnea affects breathing  during sleep. It causes breathing to stop for 10 seconds or more, or to become shallow. People with sleep apnea usually snore loudly. It can also increase the risk of: Heart attack. Stroke. Being very overweight (obese). Diabetes. Heart failure. Irregular heartbeat. High blood pressure. The goal of treatment is to help you breathe normally again. What are the causes?  The most common cause of this condition is a collapsed or blocked airway. There are three kinds of sleep apnea: Obstructive sleep apnea. This is caused by a blocked or collapsed airway. Central sleep apnea. This happens when the brain does not send the right signals to the muscles that control breathing. Mixed sleep apnea. This is a combination of obstructive and central sleep apnea. What increases the risk? Being overweight. Smoking. Having a small airway. Being older. Being female. Drinking alcohol. Taking medicines to calm yourself (sedatives or tranquilizers). Having family members with the condition. Having a tongue or tonsils that are larger than normal. What are the signs or symptoms? Trouble staying asleep. Loud snoring. Headaches in the morning. Waking up gasping. Dry mouth or sore throat in the morning. Being sleepy or tired during the day. If you are sleepy or tired during the day, you may also: Not be able to focus your mind (concentrate). Forget things. Get angry a lot and have mood swings. Feel sad (depressed). Have changes in your personality. Have less interest in sex, if you are female. Be unable to have an erection, if you are female. How is this treated?  Sleeping on your side. Using a medicine to get rid of mucus in your nose (decongestant). Avoiding the use of alcohol, medicines to help you relax, or certain pain medicines (narcotics). Losing weight, if needed. Changing your diet. Quitting smoking. Using a machine to open your airway while you sleep, such as: An oral appliance. This is  a mouthpiece that shifts your lower jaw forward. A CPAP device. This device blows air through a mask when you breathe out (exhale). An EPAP device. This has valves that you put in each nostril. A BIPAP device. This device blows air through a mask when you breathe in (inhale) and breathe out. Having surgery if other treatments do not work. Follow these instructions at home: Lifestyle Make changes that your doctor recommends. Eat a healthy diet. Lose weight if needed. Avoid alcohol, medicines to help you relax, and some pain medicines. Do not smoke or use any products that contain nicotine or tobacco. If you need help quitting, ask your doctor. General instructions Take over-the-counter and prescription medicines only as told by your doctor. If you were given a machine to use while you sleep, use it only as told by your doctor. If you are having surgery, make sure to tell your doctor you have sleep apnea. You may need to bring your device with you. Keep all follow-up visits. Contact a doctor if: The machine that you were given to use during sleep bothers you or does not seem to be working. You do not get better. You get worse. Get help right away if: Your chest hurts. You have trouble breathing in enough air. You have an uncomfortable feeling in your back, arms, or stomach. You have trouble talking. One side of your body feels weak. A part of your face is hanging down. These symptoms may be an emergency. Get help right away. Call your local emergency services (911 in the [place] Do not wait  to see if the symptoms will go away. Do not drive yourself to the hospital. Summary This condition affects breathing during sleep. The most common cause is a collapsed or blocked airway. The goal of treatment is to help you breathe normally while you sleep. This information is not intended to replace advice given to you by your health care provider. Make sure you discuss any questions you have with  your health care provider. Document Revised: 09/02/2020 Document Reviewed: 01/03/2020 Elsevier Patient Education  [place] Sleep Information, Adult Quality sleep is important for your mental and physical health. It also improves your quality of life. Quality sleep means you: Are asleep for most of the time you are in bed. Fall asleep within 30 minutes. Wake up no more than once a night. Are awake for no longer than 20 minutes if you do wake up during the night. Most adults need 7-8 hours of quality sleep each night. How can poor sleep affect me? If you do not get enough quality sleep, you may have: Mood swings. Daytime sleepiness. Decreased alertness, reaction time, and concentration. Sleep disorders, such as insomnia and sleep apnea. Difficulty with: Solving problems. Coping with stress. Paying attention. These issues may affect your performance and productivity at work, school, and home. Lack of sleep may also put you at higher risk for accidents, suicide, and risky behaviors. If you do not get quality sleep, you may also be at higher risk for several health problems, including: Infections. Type 2 diabetes. Heart disease. High blood pressure. Obesity. Worsening of long-term conditions, like arthritis, kidney disease, depression, Parkinson's disease, and epilepsy. What actions can I take to get more quality sleep? Sleep schedule and routine Stick to a sleep schedule. Go to sleep and wake up at about the same time each day. Do not try to sleep less on weekdays and make up for lost sleep on weekends. This does not work. Limit naps during the day to 30 minutes or less. Do not take naps in the late afternoon. Make time to relax before bed. Reading, listening to music, or taking a hot bath promotes quality sleep. Make your bedroom a place that promotes quality sleep. Keep your bedroom dark, quiet, and at a comfortable room temperature. Make sure your bed is  comfortable. Avoid using electronic devices that give off bright blue light for 30 minutes before bedtime. Your brain perceives bright blue light as sunlight. This includes television, phones, and computers. If you are lying awake in bed for longer than 20 minutes, get up and do a relaxing activity until you feel sleepy. Lifestyle     Try to get at least 30 minutes of exercise on most days. Do not exercise 2-3 hours before going to bed. Do not use any products that contain nicotine or tobacco. These products include cigarettes, chewing tobacco, and vaping devices, such as e-cigarettes. If you need help quitting, ask your health care provider. Do not drink caffeinated beverages for at least 8 hours before going to bed. Coffee, tea, and some sodas contain caffeine. Do not drink alcohol or eat large meals close to bedtime. Try to get at least 30 minutes of sunlight every day. Morning sunlight is best. Medical concerns Work with your health care provider to treat medical conditions that may affect sleeping, such as: Nasal obstruction. Snoring. Sleep apnea and other sleep disorders. Talk to your health care provider if you think any of your prescription medicines may cause you to have difficulty falling or staying  asleep. If you have sleep problems, talk with a sleep consultant. If you think you have a sleep disorder, talk with your health care provider about getting evaluated by a specialist. Where to find more information Sleep Foundation: sleepfoundation.org American Academy of Sleep Medicine: aasm.org Centers for Disease Control and Prevention (CDC): StoreMirror.com.cy Contact a health care provider if: You have trouble getting to sleep or staying asleep. You often wake up very early in the morning and cannot get back to sleep. You have daytime sleepiness. You have daytime sleep attacks of suddenly falling asleep and sudden muscle weakness (narcolepsy). You have a tingling sensation in your legs with  a strong urge to move your legs (restless legs syndrome). You stop breathing briefly during sleep (sleep apnea). You think you have a sleep disorder or are taking a medicine that is affecting your quality of sleep. Summary Most adults need 7-8 hours of quality sleep each night. Getting enough quality sleep is important for your mental and physical health. Make your bedroom a place that promotes quality sleep, and avoid things that may cause you to have poor sleep, such as alcohol, caffeine, smoking, or large meals. Talk to your health care provider if you have trouble falling asleep or staying asleep. This information is not intended to replace advice given to you by your health care provider. Make sure you discuss any questions you have with your health care provider. Document Revised: 05/19/2021 Document Reviewed: 05/19/2021 Elsevier Patient Education  [place].

## 2021-09-23 LAB — IRON,TIBC AND FERRITIN PANEL
Ferritin: 60 ng/mL (ref 15–150)
Iron Saturation: 12 % — ABNORMAL LOW (ref 15–55)
Iron: 42 ug/dL (ref 27–159)
Total Iron Binding Capacity: 360 ug/dL (ref 250–450)
UIBC: 318 ug/dL (ref 131–425)

## 2021-09-27 ENCOUNTER — Telehealth: Payer: Self-pay

## 2021-09-27 NOTE — Telephone Encounter (Signed)
I called patient. No answer, left a detailed message with lab results and recommendations on her cell phone VM, per DPR. I asked her to call back with any questions or concerns.

## 2021-09-27 NOTE — Telephone Encounter (Signed)
-----   Message from Larey Seat, MD sent at 09/23/2021  4:20 PM EDT ----- Very low iron saturation,  ferritin is normal . Continue with an oral supplement, such as a multimineral or prenatal vitamin with iron, OTC - no specific strength.

## 2021-09-27 NOTE — Telephone Encounter (Signed)
Patient returned my call. I discussed her lab results and recommendations with her. She will look for a MTV with iron OTC today. She will call us if she hasn't heard about scheduling her sleep study within 2 weeks. Pt verbalized understanding of results. Pt had no questions at this time but was encouraged to call back if questions arise.

## 2021-10-04 ENCOUNTER — Telehealth: Payer: Self-pay | Admitting: *Deleted

## 2021-10-04 NOTE — Telephone Encounter (Signed)
New prep instructions for the patients new date/time of procedure on 2nd floor for patient to pick up per pt's request. Pt is aware.

## 2021-10-08 ENCOUNTER — Encounter: Payer: Self-pay | Admitting: Gastroenterology

## 2021-10-08 ENCOUNTER — Ambulatory Visit (AMBULATORY_SURGERY_CENTER): Payer: Medicaid Other | Admitting: Gastroenterology

## 2021-10-08 VITALS — BP 126/84 | HR 61 | Temp 96.6°F | Resp 20 | Ht 65.0 in | Wt 227.0 lb

## 2021-10-08 DIAGNOSIS — K621 Rectal polyp: Secondary | ICD-10-CM

## 2021-10-08 DIAGNOSIS — D128 Benign neoplasm of rectum: Secondary | ICD-10-CM | POA: Diagnosis not present

## 2021-10-08 DIAGNOSIS — Z1211 Encounter for screening for malignant neoplasm of colon: Secondary | ICD-10-CM

## 2021-10-08 MED ORDER — SODIUM CHLORIDE 0.9 % IV SOLN: 500.0000 mL | Freq: Once | INTRAVENOUS | Status: DC

## 2021-10-08 NOTE — Progress Notes (Signed)
Called to room to assist during endoscopic procedure.  Patient ID and intended procedure confirmed with present staff. Received instructions for my participation in the procedure from the performing physician.  

## 2021-10-08 NOTE — Patient Instructions (Signed)
YOU HAD AN ENDOSCOPIC PROCEDURE TODAY AT THE [facility]:   Refer to the procedure report that was given to you for any specific questions about what was found during the examination.  If the procedure report does not answer your questions, please call your gastroenterologist to clarify.  If you requested that your care partner not be given the details of your procedure findings, then the procedure report has been included in a sealed envelope for you to review at your convenience later.  YOU SHOULD EXPECT: Some feelings of bloating in the abdomen. Passage of more gas than usual.  Walking can help get rid of the air that was put into your GI tract during the procedure and reduce the bloating. If you had a lower endoscopy (such as a colonoscopy or flexible sigmoidoscopy) you may notice spotting of blood in your stool or on the toilet paper. If you underwent a bowel prep for your procedure, you may not have a normal bowel movement for a few days.  Please Note:  You might notice some irritation and congestion in your nose or some drainage.  This is from the oxygen used during your procedure.  There is no need for concern and it should clear up in a day or so.  SYMPTOMS TO REPORT IMMEDIATELY:  Following lower endoscopy (colonoscopy or flexible sigmoidoscopy):  Excessive amounts of blood in the stool  Significant tenderness or worsening of abdominal pains  Swelling of the abdomen that is new, acute  Fever of 100F or higher  For urgent or emergent issues, a gastroenterologist can be reached at any hour by calling [phone]. Do not use MyChart messaging for urgent concerns.    DIET:  We do recommend a small meal at first, but then you may proceed to your regular diet.  Drink plenty of fluids but you should avoid alcoholic beverages for 24 hours.  ACTIVITY:  You should plan to take it easy for the rest of today and you should NOT DRIVE or use heavy machinery until tomorrow (because of  the sedation medicines used during the test).    FOLLOW UP: Our staff will call the number listed on your records the next business day following your procedure.  We will call around 7:15- 8:00 am to check on you and address any questions or concerns that you may have regarding the information given to you following your procedure. If we do not reach you, we will leave a message.  If you develop any symptoms (ie: fever, flu-like symptoms, shortness of breath, cough etc.) before then, please call [phone].  If you test positive for Covid 19 in the 2 weeks post procedure, please call and report this information to us.    If any biopsies were taken you will be contacted by phone or by letter within the next 1-3 weeks.  Please call us at [phone] if you have not heard about the biopsies in 3 weeks.    SIGNATURES/CONFIDENTIALITY: You and/or your care partner have signed paperwork which will be entered into your electronic medical record.  These signatures attest to the fact that that the information above on your After Visit Summary has been reviewed and is understood.  Full responsibility of the confidentiality of this discharge information lies with you and/or your care-partner.  

## 2021-10-08 NOTE — Progress Notes (Signed)
Pt's states no medical or surgical changes since previsit or office visit. 

## 2021-10-08 NOTE — Progress Notes (Signed)
PT taken to PACU. Monitors in place. VSS. Report given to RN. 

## 2021-10-08 NOTE — Op Note (Signed)
[place] Patient Name: Chelsea Henson Procedure Date: 10/08/2021 2:12 PM MRN: [id number] Endoscopist: Thornton Park MD, MD Age: 46 Referring MD:  Date of Birth: [birth year] Gender: Female Account #: [id number] Procedure:                Colonoscopy Indications:              Screening for colorectal malignant neoplasm, This                            is the patient's first colonoscopy                           No known family history of colon cancer or polyps Medicines:                Monitored Anesthesia Care Procedure:                Pre-Anesthesia Assessment:                           - Prior to the procedure, a History and Physical                            was performed, and patient medications and                            allergies were reviewed. The patient's tolerance of                            previous anesthesia was also reviewed. The risks                            and benefits of the procedure and the sedation                            options and risks were discussed with the patient.                            All questions were answered, and informed consent                            was obtained. Prior Anticoagulants: The patient has                            taken no previous anticoagulant or antiplatelet                            agents. ASA Grade Assessment: II - A patient with                            mild systemic disease. After reviewing the risks                            and benefits, the patient was deemed in  satisfactory condition to undergo the procedure.                           After obtaining informed consent, the colonoscope                            was passed under direct vision. Throughout the                            procedure, the patient's blood pressure, pulse, and                            oxygen saturations were monitored continuously. The                            CF [id number] #[id number] was  introduced through the anus                            and advanced to the 3 cm into the ileum. A second                            forward view of the right colon was performed. The                            colonoscopy was performed without difficulty. The                            patient tolerated the procedure well. The quality                            of the bowel preparation was good. The terminal                            ileum, ileocecal valve, appendiceal orifice, and                            rectum were photographed. Scope In: 2:21:00 PM Scope Out: 2:33:19 PM Scope Withdrawal Time: 0 hours 9 minutes 12 seconds  Total Procedure Duration: 0 hours 12 minutes 19 seconds  Findings:                 The perianal and digital rectal examinations were                            normal.                           A 2 mm polyp was found in the rectum. The polyp was                            sessile. The polyp was removed with a cold snare.                            Resection and retrieval were complete.  Estimated                            blood loss was minimal.                           The exam was otherwise without abnormality on                            direct and retroflexion views. Complications:            No immediate complications. Estimated Blood Loss:     Estimated blood loss was minimal. Impression:               - One 2 mm polyp in the rectum, removed with a cold                            snare. Resected and retrieved.                           - The examination was otherwise normal on direct                            and retroflexion views. Recommendation:           - Patient has a contact number available for                            emergencies. The signs and symptoms of potential                            delayed complications were discussed with the                            patient. Return to normal activities tomorrow.                             Written discharge instructions were provided to the                            patient.                           - Resume previous diet.                           - Continue present medications.                           - Await pathology results.                           - Repeat colonoscopy date to be determined after                            pending pathology results are reviewed for  surveillance.                           - Emerging evidence supports eating a diet of                            fruits, vegetables, grains, calcium, and yogurt                            while reducing red meat and alcohol may reduce the                            risk of colon cancer.                           - Thank you for allowing me to be involved in your                            colon cancer prevention. Thornton Park MD, MD 10/08/2021 2:37:01 PM This report has been signed electronically.

## 2021-10-08 NOTE — Progress Notes (Signed)
Referring Provider: Donney Dice, DO Primary Care Physician:  Donney Dice, DO   Indication for Colonoscopy:  Colon cancer screening   IMPRESSION:  Need for colon cancer screening Mother with colon polyps Appropriate candidate for monitored anesthesia care  PLAN: Colonoscopy in the [place] today   HPI: Chelsea Henson is a 46 y.o. female presents for screening colonoscopy.  No prior colonoscopy or colon cancer screening.  Mother with colon polyps. No other known family history of colon cancer or polyps. No family history of uterine/endometrial cancer, pancreatic cancer or gastric/stomach cancer.   Past Medical History:  Diagnosis Date   Abnormal uterine bleeding    Allergy    Bacterial vaginosis    Chest pain of uncertain etiology 40/98/1191   Enlarged thyroid    Fibroids    Prediabetes    Recurrent vaginitis 01/23/2019   Seasonal allergies    Snores    Tobacco abuse     Past Surgical History:  Procedure Laterality Date   CESAREAN SECTION     X3   DILATATION & CURETTAGE/HYSTEROSCOPY WITH MYOSURE N/A 05/22/2019   Procedure: DILATATION & CURETTAGE/HYSTEROSCOPY WITH MYOSURE;  Surgeon: Osborne Oman, MD;  Location: [facility];  Service: Gynecology;  Laterality: N/A;   HYSTEROSCOPY N/A 05/22/2019   Procedure: Hysteroscopy with Hydrothermal Ablation ;  Surgeon: Osborne Oman, MD;  Location: [facility];  Service: Gynecology;  Laterality: N/A;   TUBAL LIGATION      Current Outpatient Medications  Medication Sig Dispense Refill   acetaminophen (TYLENOL) 500 MG tablet Take 500 mg by mouth daily as needed (pain).     diphenhydrAMINE (BENADRYL) 25 MG tablet Take 25 mg by mouth daily as needed for itching.     fluticasone (FLONASE) 50 MCG/ACT nasal spray Place into both nostrils as needed for allergies or rhinitis.     levocetirizine (XYZAL) 5 MG tablet Take 1 tablet (5 mg total) by mouth every evening. 30 tablet 5   Current  Facility-Administered Medications  Medication Dose Route Frequency Provider Last Rate Last Admin   0.9 %  sodium chloride infusion  500 mL Intravenous Once Thornton Park, MD        Allergies as of 10/08/2021   (No Known Allergies)    Family History  Problem Relation Age of Onset   Colon polyps Mother    Hypertension Mother    Diabetes Sister    Depression Paternal Grandmother    Allergic rhinitis Neg Hx    Angioedema Neg Hx    Asthma Neg Hx    Eczema Neg Hx    Immunodeficiency Neg Hx    Urticaria Neg Hx    Colon cancer Neg Hx    Esophageal cancer Neg Hx    Rectal cancer Neg Hx    Stomach cancer Neg Hx      Physical Exam: General:   Alert,  well-nourished, pleasant and cooperative in NAD Head:  Normocephalic and atraumatic. Eyes:  Sclera clear, no icterus.   Conjunctiva pink. Mouth:  No deformity or lesions.   Neck:  Supple; no masses or thyromegaly. Lungs:  Clear throughout to auscultation.   No wheezes. Heart:  Regular rate and rhythm; no murmurs. Abdomen:  Soft, non-tender, nondistended, normal bowel sounds, no rebound or guarding.  Msk:  Symmetrical. No boney deformities LAD: No inguinal or umbilical LAD Extremities:  No clubbing or edema. Neurologic:  Alert and  oriented x4;  grossly nonfocal Skin:  No obvious rash or bruise. Psych:  Alert and cooperative. Normal mood and affect.     Studies/Results: No results found.    Wister Hoefle L. Tarri Glenn, MD, MPH 10/08/2021, 2:06 PM

## 2021-10-12 ENCOUNTER — Telehealth: Payer: Self-pay | Admitting: *Deleted

## 2021-10-12 ENCOUNTER — Telehealth: Payer: Self-pay | Admitting: Neurology

## 2021-10-12 NOTE — Telephone Encounter (Signed)
[employer] pending uploaded notes on the portal  

## 2021-10-12 NOTE — Telephone Encounter (Signed)
Left message on f/u call 

## 2021-10-24 ENCOUNTER — Encounter: Payer: Self-pay | Admitting: Gastroenterology

## 2021-10-26 NOTE — Telephone Encounter (Signed)
[facility] community auth: [id number] (exp. 10/12/21 to 12/31/21).   Patient is scheduled at [facility] for 11/22/21 at 8 pm.  Mailed packet to the patient.

## 2021-11-08 ENCOUNTER — Emergency Department (HOSPITAL_COMMUNITY)
Admission: EM | Admit: 2021-11-08 | Discharge: 2021-11-08 | Disposition: A | Discharge status: Home / Self Care | Payer: Medicaid Other | Attending: Emergency Medicine | Admitting: Emergency Medicine

## 2021-11-08 ENCOUNTER — Telehealth: Payer: Self-pay

## 2021-11-08 ENCOUNTER — Ambulatory Visit: Payer: Medicaid Other | Admitting: Family Medicine

## 2021-11-08 ENCOUNTER — Encounter (HOSPITAL_COMMUNITY): Payer: Self-pay

## 2021-11-08 ENCOUNTER — Other Ambulatory Visit: Payer: Self-pay

## 2021-11-08 DIAGNOSIS — U071 COVID-19: Secondary | ICD-10-CM | POA: Diagnosis not present

## 2021-11-08 DIAGNOSIS — R52 Pain, unspecified: Secondary | ICD-10-CM

## 2021-11-08 DIAGNOSIS — R0981 Nasal congestion: Secondary | ICD-10-CM | POA: Diagnosis present

## 2021-11-08 DIAGNOSIS — R112 Nausea with vomiting, unspecified: Secondary | ICD-10-CM | POA: Diagnosis not present

## 2021-11-08 DIAGNOSIS — M791 Myalgia, unspecified site: Secondary | ICD-10-CM | POA: Diagnosis not present

## 2021-11-08 LAB — URINALYSIS, ROUTINE W REFLEX MICROSCOPIC
Bacteria, UA: NONE SEEN
Bilirubin Urine: NEGATIVE
Glucose, UA: NEGATIVE mg/dL
Hgb urine dipstick: NEGATIVE
Ketones, ur: NEGATIVE mg/dL
Leukocytes,Ua: NEGATIVE
Nitrite: NEGATIVE
Protein, ur: NEGATIVE mg/dL
Specific Gravity, Urine: 1.025 (ref 1.005–1.030)
pH: 6 (ref 5.0–8.0)

## 2021-11-08 LAB — COMPREHENSIVE METABOLIC PANEL
ALT: 26 U/L (ref 0–44)
AST: 26 U/L (ref 15–41)
Albumin: 3.6 g/dL (ref 3.5–5.0)
Alkaline Phosphatase: 76 U/L (ref 38–126)
Anion gap: 10 (ref 5–15)
BUN: 11 mg/dL (ref 6–20)
CO2: 26 mmol/L (ref 22–32)
Calcium: 9.2 mg/dL (ref 8.9–10.3)
Chloride: 101 mmol/L (ref 98–111)
Creatinine, Ser: 0.76 mg/dL (ref 0.44–1.00)
GFR, Estimated: >60 mL/min (ref >60)
Glucose, Bld: 109 mg/dL — ABNORMAL HIGH (ref 70–99)
Potassium: 4.3 mmol/L (ref 3.5–5.1)
Sodium: 137 mmol/L (ref 135–145)
Total Bilirubin: 0.9 mg/dL (ref 0.3–1.2)
Total Protein: 6.8 g/dL (ref 6.5–8.1)

## 2021-11-08 LAB — CBC
HCT: 41.8 % (ref 36.0–46.0)
Hemoglobin: 13.6 g/dL (ref 12.0–15.0)
MCH: 28.8 pg (ref 26.0–34.0)
MCHC: 32.5 g/dL (ref 30.0–36.0)
MCV: 88.6 fL (ref 80.0–100.0)
Platelets: 226 10*3/uL (ref 150–400)
RBC: 4.72 MIL/uL (ref 3.87–5.11)
RDW: 14 % (ref 11.5–15.5)
WBC: 14.2 10*3/uL — ABNORMAL HIGH (ref 4.0–10.5)
nRBC: 0 % (ref 0.0–0.2)

## 2021-11-08 LAB — RESP PANEL BY RT-PCR (FLU A&B, COVID) ARPGX2
Influenza A by PCR: NEGATIVE
Influenza B by PCR: NEGATIVE
SARS Coronavirus 2 by RT PCR: POSITIVE — AB

## 2021-11-08 LAB — I-STAT BETA HCG BLOOD, ED (MC, WL, AP ONLY): I-stat hCG, quantitative: <5 m[IU]/mL (ref <5)

## 2021-11-08 MED ORDER — IBUPROFEN 400 MG PO TABS
400.0000 mg | ORAL_TABLET | Freq: Once | ORAL | Status: AC
  Administered 2021-11-08: 400 mg via ORAL
  Filled 2021-11-08: qty 1

## 2021-11-08 MED ORDER — MOLNUPIRAVIR EUA 200MG CAPSULE: 4.0000 | ORAL_CAPSULE | Freq: Two times a day (BID) | ORAL | 0 refills | Status: AC

## 2021-11-08 MED ORDER — ONDANSETRON HCL 4 MG/2ML IJ SOLN
4.0000 mg | Freq: Once | INTRAMUSCULAR | Status: AC
  Administered 2021-11-08: 4 mg via INTRAVENOUS
  Filled 2021-11-08: qty 2

## 2021-11-08 MED ORDER — ACETAMINOPHEN 500 MG PO TABS
1000.0000 mg | ORAL_TABLET | Freq: Four times a day (QID) | ORAL | Status: DC | PRN
  Administered 2021-11-08: 1000 mg via ORAL
  Filled 2021-11-08: qty 2

## 2021-11-08 MED ORDER — SODIUM CHLORIDE 0.9 % IV BOLUS
1000.0000 mL | Freq: Once | INTRAVENOUS | Status: AC
  Administered 2021-11-08: 1000 mL via INTRAVENOUS

## 2021-11-08 MED ORDER — ONDANSETRON 4 MG PO TBDP
4.0000 mg | ORAL_TABLET | Freq: Once | ORAL | Status: AC | PRN
  Administered 2021-11-08: 4 mg via ORAL
  Filled 2021-11-08: qty 1

## 2021-11-08 NOTE — ED Notes (Signed)
Pt given fluids.

## 2021-11-08 NOTE — Discharge Instructions (Addendum)
It was our pleasure to provide your ER care today - we hope that you feel better.  Your covid test is positive.   Drink plenty of fluids/stay well hydrated. Stay active. Take full, deep and complete breaths. Take acetaminophen and/or ibuprofen as need for body aches and fevers.  Take molnupravir as prescribed.   Follow up with primary care doctor in two weeks if symptoms fail to improve/resolve.  Return to ER if worse, new symptoms, increased trouble breathing, persistent vomiting, severe abdominal pain, or other concern.

## 2021-11-08 NOTE — Telephone Encounter (Signed)
Patient calls nurse line regarding sick symptoms. Reports body aches, fever, chills, vomiting, nausea and weakness. She states that she can "hardly lift head off of pillow." Onset last night. She has been taking small sips of water, however, continues to vomit.   Denies recent sick exposures, however, was out of town recently.   Due to weakness and inability to tolerate fluids recommend that patient receive evaluation in UC or ED.   Patient verbalizes understanding.   Talbot Grumbling, RN

## 2021-11-08 NOTE — ED Provider Triage Note (Signed)
Emergency Medicine Provider Triage Evaluation Note  Contina Strain , a 46 y.o. female  was evaluated in triage.  Pt complains of nausea, vomiting, headache, diarrhea, chills, muscle aches that "he made aware" this morning.  Denies any known sick contact.  Concerned about COVID/flu.  Denies any overt abdominal pain..  Review of Systems  Positive: See above Negative:   Physical Exam  BP 115/68   Pulse 88   Temp 99.6 F (37.6 C) (Oral)   Resp 16   Ht '5\' 5"'$  (1.651 m)   Wt 103 kg   LMP 03/29/2019 Comment: bleeding ongoing   SpO2 99%   BMI 37.77 kg/m  Gen:   Awake, no distress   Resp:  Normal effort  MSK:   Moves extremities without difficulty  Other:  No abdominal tenderness on exam  Medical Decision Making  Medically screening exam initiated at 2:16 PM.  Appropriate orders placed.  Hermina Barters was informed that the remainder of the evaluation will be completed by another provider, this initial triage assessment does not replace that evaluation, and the importance of remaining in the ED until their evaluation is complete.     Wilnette Kales, [place] 11/08/21 [id number]

## 2021-11-08 NOTE — ED Provider Notes (Signed)
[facility] EMERGENCY DEPARTMENT Provider Note   CSN: [id number] Arrival date & time: 11/08/21  1307     History  Chief Complaint  Patient presents with   Emesis    Chelsea Henson is a 46 y.o. female.  Pt with fever, nausea, vomiting, diarrhea, congestion, and body aches. Symptoms acute onset in past day, moderate. Emesis not bloody or bilious. Diarrhea watery to loose. Few episodes of each. No abd pain or distension. No recent antibiotic use or new meds. No change in diet, or known bad food ingestion. No recent travel or ill contacts. Denies dysuria or gu c/o. No sore throat or trouble swallowing. Denies cough or sob. No chest pain. General/diffuse body aches - no focal area of pain.   The history is provided by the patient and medical records.  Emesis Associated symptoms: diarrhea, fever and myalgias   Associated symptoms: no abdominal pain, no cough, no headaches and no sore throat        Home Medications Prior to Admission medications   Medication Sig Start Date End Date Taking? Authorizing Provider  acetaminophen (TYLENOL) 500 MG tablet Take 500 mg by mouth daily as needed (pain).    [provider]  diphenhydrAMINE (BENADRYL) 25 MG tablet Take 25 mg by mouth daily as needed for itching.    [provider]  fluticasone (FLONASE) 50 MCG/ACT nasal spray Place into both nostrils as needed for allergies or rhinitis.    [provider]  levocetirizine (XYZAL) 5 MG tablet Take 1 tablet (5 mg total) by mouth every evening. 06/04/21   Ezequiel Essex, MD      Allergies    Patient has no known allergies.    Review of Systems   Review of Systems  Constitutional:  Positive for fever.  HENT:  Positive for congestion. Negative for sinus pain and sore throat.   Eyes:  Negative for pain, redness and visual disturbance.  Respiratory:  Negative for cough and shortness of breath.   Cardiovascular:  Negative for chest pain.   Gastrointestinal:  Positive for diarrhea and vomiting. Negative for abdominal pain.  Genitourinary:  Negative for dysuria.  Musculoskeletal:  Positive for myalgias. Negative for neck pain and neck stiffness.  Skin:  Negative for rash.  Neurological:  Negative for headaches.  Hematological:  Does not bruise/bleed easily.  Psychiatric/Behavioral:  Negative for confusion.     Physical Exam Updated Vital Signs BP 104/64   Pulse 69   Temp 98.8 F (37.1 C) (Oral)   Resp (!) 23   Ht 1.651 m ('5\' 5"'$ )   Wt 103 kg   LMP 03/29/2019 Comment: bleeding ongoing   SpO2 99%   BMI 37.77 kg/m  Physical Exam Vitals and nursing note reviewed.  Constitutional:      Appearance: Normal appearance. She is well-developed.  HENT:     Head: Atraumatic.     Nose: Nose normal.     Mouth/Throat:     Mouth: Mucous membranes are moist.  Eyes:     General: No scleral icterus.    Conjunctiva/sclera: Conjunctivae normal.     Pupils: Pupils are equal, round, and reactive to light.  Neck:     Trachea: No tracheal deviation.  Cardiovascular:     Rate and Rhythm: Normal rate and regular rhythm.     Pulses: Normal pulses.     Heart sounds: Normal heart sounds. No murmur heard.    No friction rub. No gallop.  Pulmonary:     Effort:  Pulmonary effort is normal. No respiratory distress.     Breath sounds: Normal breath sounds.  Abdominal:     General: Bowel sounds are normal. There is no distension.     Palpations: Abdomen is soft.     Tenderness: There is no abdominal tenderness.  Genitourinary:    Comments: No cva tenderness.  Musculoskeletal:        General: No swelling.     Cervical back: Normal range of motion and neck supple. No rigidity. No muscular tenderness.  Skin:    General: Skin is warm and dry.     Findings: No rash.  Neurological:     Mental Status: She is alert.     Comments: Alert, speech normal.   Psychiatric:        Mood and Affect: Mood normal.     ED Results / Procedures /  Treatments   Labs (all labs ordered are listed, but only abnormal results are displayed) Results for orders placed or performed during the hospital encounter of 11/08/21  Resp Panel by RT-PCR (Flu A&B, Covid) Anterior Nasal Swab   Specimen: Anterior Nasal Swab  Result Value Ref Range   SARS Coronavirus 2 by RT PCR POSITIVE (A) NEGATIVE   Influenza A by PCR NEGATIVE NEGATIVE   Influenza B by PCR NEGATIVE NEGATIVE  Comprehensive metabolic panel  Result Value Ref Range   Sodium 137 135 - 145 mmol/L   Potassium 4.3 3.5 - 5.1 mmol/L   Chloride 101 98 - 111 mmol/L   CO2 26 22 - 32 mmol/L   Glucose, Bld 109 (H) 70 - 99 mg/dL   BUN 11 6 - 20 mg/dL   Creatinine, Ser 0.76 0.44 - 1.00 mg/dL   Calcium 9.2 8.9 - 10.3 mg/dL   Total Protein 6.8 6.5 - 8.1 g/dL   Albumin 3.6 3.5 - 5.0 g/dL   AST 26 15 - 41 U/L   ALT 26 0 - 44 U/L   Alkaline Phosphatase 76 38 - 126 U/L   Total Bilirubin 0.9 0.3 - 1.2 mg/dL   GFR, Estimated >60 >60 mL/min   Anion gap 10 5 - 15  CBC  Result Value Ref Range   WBC 14.2 (H) 4.0 - 10.5 K/uL   RBC 4.72 3.87 - 5.11 MIL/uL   Hemoglobin 13.6 12.0 - 15.0 g/dL   HCT 41.8 36.0 - 46.0 %   MCV 88.6 80.0 - 100.0 fL   MCH 28.8 26.0 - 34.0 pg   MCHC 32.5 30.0 - 36.0 g/dL   RDW 14.0 11.5 - 15.5 %   Platelets 226 150 - 400 K/uL   nRBC 0.0 0.0 - 0.2 %  Urinalysis, Routine w reflex microscopic Urine, Clean Catch  Result Value Ref Range   Color, Urine YELLOW YELLOW   APPearance CLEAR CLEAR   Specific Gravity, Urine 1.025 1.005 - 1.030   pH 6.0 5.0 - 8.0   Glucose, UA NEGATIVE NEGATIVE mg/dL   Hgb urine dipstick NEGATIVE NEGATIVE   Bilirubin Urine NEGATIVE NEGATIVE   Ketones, ur NEGATIVE NEGATIVE mg/dL   Protein, ur NEGATIVE NEGATIVE mg/dL   Nitrite NEGATIVE NEGATIVE   Leukocytes,Ua NEGATIVE NEGATIVE   RBC / HPF 0-5 0 - 5 RBC/hpf   WBC, UA 0-5 0 - 5 WBC/hpf   Bacteria, UA NONE SEEN NONE SEEN   Squamous Epithelial / LPF 0-5 0 - 5   Mucus PRESENT   I-Stat beta hCG  blood, ED  Result Value Ref Range   I-stat hCG, quantitative <  5.0 <5 mIU/mL   Comment 3             EKG None  Radiology No results found.  Procedures Procedures    Medications Ordered in ED Medications  acetaminophen (TYLENOL) tablet 1,000 mg (1,000 mg Oral Given 11/08/21 1820)  sodium chloride 0.9 % bolus 1,000 mL (has no administration in time range)  ondansetron (ZOFRAN) injection 4 mg (has no administration in time range)  ondansetron (ZOFRAN-ODT) disintegrating tablet 4 mg (4 mg Oral Given 11/08/21 1412)    ED Course/ Medical Decision Making/ A&P                           Medical Decision Making Problems Addressed: Body aches: acute illness or injury with systemic symptoms COVID-19 virus infection: acute illness or injury with systemic symptoms that poses a threat to life or bodily functions Nausea vomiting and diarrhea: acute illness or injury with systemic symptoms that poses a threat to life or bodily functions  Amount and/or Complexity of Data Reviewed External Data Reviewed: notes. Labs: ordered. Decision-making details documented in ED Course.  Risk OTC drugs. Prescription drug management. Decision regarding hospitalization.   Iv ns. Continuous pulse ox and cardiac monitoring. Labs ordered/sent.  Diff dx includes GE, covid, viral illness, dehydration, aki - dispo decision including potential need for admission considered if significant aki, etc - will get labs and reassess.   Acetaminophen po.    Reviewed nursing notes and prior charts for additional history. External reports reviewed. Additional history from:  Cardiac monitor: sinus rhythm, rate 70.  Labs reviewed/interpreted by me - hgb normal, covid positive.   Iv ns bolus. Zofran iv.   Po fluids/food.   No recurrent nvd in ED.   Ibuprofen po.   Pt indicates previously vaccinated for covid. Discussed oral therapy for covid - pt indicates would like rx - provided.   Pt appears stable for  d/c.   Return precautions provided.            Final Clinical Impression(s) / ED Diagnoses Final diagnoses:  None    Rx / DC Orders ED Discharge Orders     None         Lajean Saver, MD 11/08/21 2248

## 2021-11-08 NOTE — ED Triage Notes (Signed)
Reports woke up this am with n/v/d chills and headache.  Unable to say how many times she vomited or had diarrhea.

## 2021-11-10 ENCOUNTER — Encounter (HOSPITAL_COMMUNITY): Payer: Self-pay | Admitting: Emergency Medicine

## 2021-11-10 ENCOUNTER — Other Ambulatory Visit: Payer: Self-pay

## 2021-11-10 ENCOUNTER — Emergency Department (HOSPITAL_COMMUNITY)
Admission: EM | Admit: 2021-11-10 | Discharge: 2021-11-10 | Disposition: A | Discharge status: Home / Self Care | Payer: Medicaid Other | Attending: Emergency Medicine | Admitting: Emergency Medicine

## 2021-11-10 DIAGNOSIS — M791 Myalgia, unspecified site: Secondary | ICD-10-CM | POA: Diagnosis present

## 2021-11-10 DIAGNOSIS — U071 COVID-19: Secondary | ICD-10-CM | POA: Diagnosis not present

## 2021-11-10 DIAGNOSIS — R112 Nausea with vomiting, unspecified: Secondary | ICD-10-CM | POA: Diagnosis not present

## 2021-11-10 MED ORDER — ACETAMINOPHEN 500 MG PO TABS
1000.0000 mg | ORAL_TABLET | Freq: Once | ORAL | Status: AC
  Administered 2021-11-10: 1000 mg via ORAL
  Filled 2021-11-10: qty 2

## 2021-11-10 MED ORDER — ONDANSETRON 4 MG PO TBDP: 4.0000 mg | ORAL_TABLET | Freq: Three times a day (TID) | ORAL | 0 refills | Status: DC | PRN

## 2021-11-10 MED ORDER — ONDANSETRON 4 MG PO TBDP
4.0000 mg | ORAL_TABLET | Freq: Once | ORAL | Status: AC
  Administered 2021-11-10: 4 mg via ORAL
  Filled 2021-11-10: qty 1

## 2021-11-10 MED ORDER — IBUPROFEN 800 MG PO TABS
800.0000 mg | ORAL_TABLET | Freq: Once | ORAL | Status: AC
  Administered 2021-11-10: 800 mg via ORAL
  Filled 2021-11-10: qty 1

## 2021-11-10 NOTE — Discharge Instructions (Addendum)
Thank you for coming to [facility] Emergency Department. You were seen for  We did an exam and vitals which were reassuring.  We have prescribed you Zofran to take every 8 hours as needed for nausea and vomiting under the tongue.  You can alternate taking Tylenol and ibuprofen for fever, body aches, and other pains.  Stay well-hydrated at home.  For your concern for ingrown toenail, you can soak your feet and try to encourage the nail to grow outward. If it begins to drain pus, becomes red or increasingly painful, please see your primary care physician about removing it or you can come to the ED.   Please follow up with your primary care provider within 1 week.   Do not hesitate to return to the ED or call 911 if you experience: -Worsening symptoms -Lightheadedness, passing out -Fevers/chills -Anything else that concerns you

## 2021-11-10 NOTE — ED Notes (Signed)
Pt. Is not nauseated anymore but feels stuffy and body aches. Pt stated, I was able to keep down my food yesterday until during the night started vomiting again so I came back. Marland Kitchen

## 2021-11-10 NOTE — ED Provider Notes (Signed)
[facility] EMERGENCY DEPARTMENT Provider Note   CSN: [id number] Arrival date & time: 11/10/21  0140     History  Chief Complaint  Patient presents with   Covid+ / Emesis /Body Aches    Chelsea Henson is a 46 y.o. female with prediabetes, fibroids, anxiety depression, obesity, tobacco abuse, restless leg syndrome presents with COVID-positive, nausea vomiting, body aches.   Per chart review, patient was seen yesterday in this emergency department complaining of fever, nausea vomiting diarrhea congestion and body aches.  Denied shortness of breath.  Found to be COVID-positive and discharged with molnupiravir.  Patient states that she went home last night and started vomiting again so came back. She feels the same as she did yesterday except with the vomiting.  Continued to have some mild cough, fever/chills, and body aches, but when the nausea came back she decided to come back to the emergency department.  Denies any chest pain, shortness of breath, abdominal pain, leg swelling. Patient also mentions that she has an ingrown toenail on her left big toe.  Slightly painful, no erythema that she has noticed.   HPI     Home Medications Prior to Admission medications   Medication Sig Start Date End Date Taking? Authorizing Provider  ondansetron (ZOFRAN-ODT) 4 MG disintegrating tablet Take 1 tablet (4 mg total) by mouth every 8 (eight) hours as needed for nausea or vomiting. 11/10/21  Yes Audley Hose, MD  acetaminophen (TYLENOL) 500 MG tablet Take 500 mg by mouth daily as needed (pain).    [provider]  diphenhydrAMINE (BENADRYL) 25 MG tablet Take 25 mg by mouth daily as needed for itching.    [provider]  fluticasone (FLONASE) 50 MCG/ACT nasal spray Place into both nostrils as needed for allergies or rhinitis.    [provider]  levocetirizine (XYZAL) 5 MG tablet Take 1 tablet (5 mg total) by mouth every evening. 06/04/21   Ezequiel Essex, MD  molnupiravir EUA (LAGEVRIO) 200 mg CAPS capsule Take 4 capsules (800 mg total) by mouth 2 (two) times daily for 5 days. 11/08/21 11/13/21  Lajean Saver, MD      Allergies    Patient has no known allergies.    Review of Systems   Review of Systems Review of systems negative for SOB.  A 10 point review of systems was performed and is negative unless otherwise reported in HPI.  Physical Exam Updated Vital Signs BP 116/76   Pulse 66   Temp 98.2 F (36.8 C) (Oral)   Resp (!) 21   LMP 03/29/2019 Comment: bleeding ongoing   SpO2 100%  Physical Exam General: Normal appearing female, lying in bed.  HEENT: MMM, trachea midline.  Nasal congestion Cardiology: RRR, no murmurs/rubs/gallops. BL radial and DP pulses equal bilaterally.  Resp: Normal respiratory rate and effort. CTAB, no wheezes, rhonchi, crackles.  Abd: Soft, non-tender, non-distended. No rebound tenderness or guarding.  GU: Deferred. MSK: No peripheral edema or signs of trauma. No cyanosis or clubbing.  Left great toe without any erythema, purulent drainage, swelling.  Very mild tenderness palpation of the lateral edge of the toenail without obvious ingrown portion. Skin: warm, dry. No rashes or lesions. Neuro: A&Ox4, CNs II-XII grossly intact. MAEs. Sensation grossly intact.  Psych: Normal mood and affect.   ED Results / Procedures / Treatments   Labs (all labs ordered are listed, but only abnormal results are displayed) Labs Reviewed - No data to display  EKG None  Radiology  No results found.  Procedures Procedures    Medications Ordered in ED Medications  ibuprofen (ADVIL) tablet 800 mg (800 mg Oral Given 11/10/21 0216)  ondansetron (ZOFRAN-ODT) disintegrating tablet 4 mg (4 mg Oral Given 11/10/21 0216)  acetaminophen (TYLENOL) tablet 1,000 mg (1,000 mg Oral Given 11/10/21 4854)    ED Course/ Medical Decision Making/ A&P                          Medical Decision Making Risk OTC  drugs. Prescription drug management.    Patient is very well-appearing with nasal congestion and minor cough, no SOB or increased WOB, vitals stable and exam is reassuring.  Patient feels the same as yesterday, only came back because she became nauseated again at home. She was able to take PO yesterday and keep some fluids down, low c/f hypoglycemia/electrolyte abnormalities/AKI.  Patient received motrin and zofran ODT from waiting room and feels improved upon my evaluation.  Patient mentions an ingrown L great toenail which is non-erythematous, no purulent drainage, no c/f infection. Encouraged her to soak her feet and encourage the nail to grow outward. Will prescribe patient zofran ODT q8h prn and instruct to f/u with PCP w/in 1  week.  All questions answered patient inspection, patient reports understanding.  Dispo: DC           Final Clinical Impression(s) / ED Diagnoses Final diagnoses:  Nausea and vomiting, unspecified vomiting type  COVID-19    Rx / DC Orders ED Discharge Orders          Ordered    ondansetron (ZOFRAN-ODT) 4 MG disintegrating tablet  Every 8 hours PRN        11/10/21 0810             This note was created using dictation software, which may contain spelling or grammatical errors.    Audley Hose, MD 11/10/21 [phone]

## 2021-11-10 NOTE — ED Triage Notes (Signed)
Patient is Covid +  reports fever with emesis , cough , body aches and chills this week .

## 2021-11-10 NOTE — ED Provider Triage Note (Signed)
  Emergency Medicine Provider Triage Evaluation Note  MRN:  [id number]  Arrival date & time: 11/10/21    Medically screening exam initiated at 2:06 AM.   CC:   Covid+ / Emesis /Body Aches   HPI:  Chelsea Henson is a 46 y.o. year-old female presents to the ED with chief complaint of COVID with associated nausea and vomiting.  Reports severe body aches..  History provided by patient. ROS:  -As included in HPI PE:   Vitals:   11/10/21 0159  BP: (!) 142/88  Pulse: 92  Resp: 17  Temp: (!) 100.9 F (38.3 C)  SpO2: 100%    Non-toxic appearing No respiratory distress  MDM:  Returns with body aches from COVID.   I've ordered ibuprofen in triage to expedite lab/diagnostic workup.  Patient was informed that the remainder of the evaluation will be completed by another provider, this initial triage assessment does not replace that evaluation, and the importance of remaining in the ED until their evaluation is complete.    Montine Circle, PA-C 11/10/21 0207

## 2021-11-12 ENCOUNTER — Ambulatory Visit (INDEPENDENT_AMBULATORY_CARE_PROVIDER_SITE_OTHER): Payer: Medicaid Other | Admitting: Student

## 2021-11-12 VITALS — BP 126/70 | HR 69 | Ht 65.0 in | Wt 223.0 lb

## 2021-11-12 DIAGNOSIS — U071 COVID-19: Secondary | ICD-10-CM | POA: Diagnosis not present

## 2021-11-12 NOTE — Progress Notes (Signed)
    SUBJECTIVE:   CHIEF COMPLAINT / HPI:   Patient is a 46 year old female who presented today for ED visit follow-up. She was recently seen in the ED 4 days ago  where she tested positive for Covid. Initial symptoms include fever, body ache, cough, nausea, vomiting and diarrhea. She was started on Lagevrio '200mg'$  daily with PRN Zofran. Took only one dose of the Lagevrio and discontinued it because it made her feel sick She's been doing motin, tylenol and PRN zofran Patient report she's feeling much better today She has received Covid vaccines in the past but not all the boosters.  PERTINENT  PMH / PSH: Reviewed   OBJECTIVE:   BP 126/70   Pulse 69   Ht '5\' 5"'$  (1.651 m)   Wt 223 lb (101.2 kg)   LMP 03/29/2019 Comment: bleeding ongoing   SpO2 99%   BMI 37.11 kg/m    Physical Exam General: Alert, well appearing, NAD Cardiovascular: RRR, No Murmurs, Normal S2/S2 Respiratory: CTAB, No wheezing or Rales Abdomen: No distension or tenderness   ASSESSMENT/PLAN:   COVID 19 follow up Patient who recently tested positive for Covid endorses improvement of symptom. Today she's afebrile with clear breath sounds bilaterally on exam and reassuring O2 sats. -Recommend continued use of PRN Motrin, Tylenol and Motrin. -Encourage adequate hydration -Advised continued isolation for 1 more days and continue use of appropriate max for 5 additional days per CDC recommendations. -Provided patient with a return to work note for 11/16/2021 -Return precaution reviewed with patient.     Alen Bleacher, MD [place]

## 2021-11-12 NOTE — Patient Instructions (Signed)
It was wonderful to see you today. Thank you for allowing me to be a part of your care. Below is a short summary of what we discussed at your visit today:  Glad to hear you are doing much better.  I recommend use of mucinex for the congestion and I encourage adequate hydration.  Continue isolation for 5 days from the onset of symptoms followed by 5 days of wearing appropriate mask  You can return to work on 11/16/21.  Please bring all of your medications to every appointment!  If you have any questions or concerns, please do not hesitate to contact us via phone or MyChart message.   Alen Bleacher, MD [facility]

## 2021-11-22 ENCOUNTER — Ambulatory Visit (INDEPENDENT_AMBULATORY_CARE_PROVIDER_SITE_OTHER): Payer: Medicaid Other | Admitting: Neurology

## 2021-11-22 DIAGNOSIS — R252 Cramp and spasm: Secondary | ICD-10-CM

## 2021-11-22 DIAGNOSIS — G2581 Restless legs syndrome: Secondary | ICD-10-CM

## 2021-11-22 DIAGNOSIS — R7303 Prediabetes: Secondary | ICD-10-CM

## 2021-11-22 DIAGNOSIS — R351 Nocturia: Secondary | ICD-10-CM

## 2021-11-22 DIAGNOSIS — G4719 Other hypersomnia: Secondary | ICD-10-CM

## 2021-11-22 DIAGNOSIS — R0683 Snoring: Secondary | ICD-10-CM

## 2021-11-22 DIAGNOSIS — F519 Sleep disorder not due to a substance or known physiological condition, unspecified: Secondary | ICD-10-CM

## 2021-11-23 ENCOUNTER — Telehealth: Payer: Self-pay

## 2021-11-23 NOTE — Telephone Encounter (Signed)
Patient calls nurse line regarding continued symptoms related to recent COVID. Tested COVID positive on 11/08/21. She states that she has persistent productive cough. She reports that she is coughing up a lot of phlegm. She states that she has been using Mucinex, however, it is not helping. She states that chest is hurting with coughing. No SHOB or active chest pain.   She is requesting alternative medication to help with symptoms.   Spoke with Dr. Adah Salvage regarding patient. Advised that he would send in tessalon to patient's pharmacy. Patient requests that medication be sent to [facility] on [address] [place].   ED and return precautions discussed.   Talbot Grumbling, RN

## 2021-11-24 ENCOUNTER — Other Ambulatory Visit: Payer: Self-pay | Admitting: Student

## 2021-11-24 DIAGNOSIS — R052 Subacute cough: Secondary | ICD-10-CM

## 2021-11-24 MED ORDER — BENZONATATE 100 MG PO CAPS: 100.0000 mg | ORAL_CAPSULE | Freq: Three times a day (TID) | ORAL | 0 refills | Status: DC | PRN

## 2021-12-03 ENCOUNTER — Ambulatory Visit: Payer: Medicaid Other | Admitting: Student

## 2021-12-06 ENCOUNTER — Ambulatory Visit: Payer: Medicaid Other | Admitting: Family Medicine

## 2021-12-06 NOTE — Patient Instructions (Incomplete)
It was great to see you! Thank you for allowing me to participate in your care!  I recommend that you always bring your medications to each appointment as this makes it easy to ensure we are on the correct medications and helps [place] not miss when refills are needed.  Our plans for today:  - *** -   We are checking some labs today, I will call you if they are abnormal will send you a MyChart message or a letter if they are normal.  If you do not hear about your labs in the next 2 weeks please let us know.***  Take care and seek immediate care sooner if you develop any concerns.   Dr. Salvadore Oxford, MD [place]

## 2021-12-06 NOTE — Progress Notes (Deleted)
    SUBJECTIVE:   CHIEF COMPLAINT / HPI:     COVID - Had on 11/12/21.  PERTINENT  PMH / PSH: Prediabetes, anxiety/depression  OBJECTIVE:   LMP 03/29/2019 Comment: bleeding ongoing   ***  ASSESSMENT/PLAN:   No problem-specific Assessment & Plan notes found for this encounter.     Salvadore Oxford, MD [place]

## 2021-12-06 NOTE — Addendum Note (Signed)
Addended by: Larey Seat on: 12/06/2021 03:01 PM   Modules accepted: Orders

## 2021-12-06 NOTE — Procedures (Signed)
[facility] SPLIT NIGHT INTERPRETATION REPORT   STUDY DATE: 11/22/2021     PATIENT NAME:  Chelsea Henson, Chelsea Henson         DATE OF BIRTH:  10/08/1975  PATIENT ID:  [id number]    TYPE OF STUDY:  SPLIT  READING PHYSICIAN: Larey Seat, MD REFERRAL :  Sherren Mocha McDiarmid, MD  SCORING TECHNICIAN: RPSGT Sheena Fields   INDICATIONS:  Chelsea Henson is a 46 year-old Female who reports symptoms suggesting of excessive daytime sleepiness, Loud snoring, 15 pound weight gain, recently quit smoking . The Epworth Sleepiness Scale was endorsed at 18 out of 24 (scores above or equal to 10 are suggestive of hypersomnolence).FSS endorsed at 52/63. Depression score positive.  DESCRIPTION: A RPSG Technologist was in attendance for the duration of the recording.  Data collection, scoring, video monitoring, and reporting were performed in compliance with the AASM Manual for the Scoring of Sleep and Associated Events; (Hypopnea is scored based on the criteria listed in Section VIII D. 1b in the AASM Manual V2.6 using a 4% oxygen desaturation rule or Hypopnea is scored based on the criteria listed in Section VIII D. 1a in the AASM Manual V2.6 using 3% oxygen desaturation and /or arousal rule).    ADDITIONAL INFORMATION:  Height: 65.0 in Weight: 228 lb (BMI 37) Neck Size: 15.0 in Medications: Tylenol, Benadryl, Flonase, Xyzal     DIAGNOSTIC ANALYSIS STUDY DETAILS: Lights off was at 20:29: and lights on 04:25: (475 minutes hours in bed). This study was performed with an initial diagnostic portion followed by positive airway pressure titration.  SLEEP CONTINUITY AND SLEEP ARCHITECTURE:  The diagnostic portion of the study began at 20:29 and ended at 01:11, for a recording time of 4h 42 minutes.  Total sleep time was 213 minutes with a decreased sleep efficiency at 75.7%. Wake after sleep onset (WASO) time accounted for 49 minutes.  Sleep latency was normal at 19.0 minutes. REM sleep  latency was normal at 75.5 minutes.    Of the total sleep time, the percentage of stage N1 sleep was 3.5%, stage N2 sleep was 62.5%, stage N3 sleep was 15.9%, and REM sleep was 18.0%.  There were 2 Stage R periods observed during this portion of the study, 11 awakenings (i.e. transitions to Stage W from any sleep stage), and 42.0 total stage transitions.      RESPIRATORY MONITORING:   Based on AASM criteria (using a 3% oxygen desaturation and /or arousal rule for scoring hypopneas), there were 2 apneas (2 obstructive; 0 central; 0 mixed), and 76 hypopneas.   The Apnea index was 0.6/h . Hypopnea index was 20.0/h. The AHI (apnea-hypopnea index) was 20.5/h overall (AHI was 15.2/h in supine; AHI was 31.2/h in REM, and 31.6 supine REM). There were 0 respiratory effort-related arousals (RERAs).   BODY POSITION: Duration of total sleep and percent of total sleep in their respective position is as follows: supine 87 minutes (40.7%), non-supine 126.5 minutes (59.3%); right 20 minutes (9.6%), left 30 minutes (14.3%), and prone 75 minutes (35.4%). Total supine REM sleep time was 19 minutes (49.4% of total REM sleep).     OXIMETRY: Total sleep time spent at, or below 88% was 0.3 minutes, or 0.1% of total sleep time. oxyhemoglobin desaturations (nadir 88%) from a normal baseline (mean 98%). Total time spent at, or below 88% was 24.0 minutes, or 11.2% of total sleep time. Snoring was absent. There were 0.0 occurrences of Cheyne Stokes breathing.   LIMB MOVEMENTS: There  were 0 periodic limb movements of sleep (0.0/h), of which 0 (0.0/h) were associated with an arousal. AROUSAL (Baseline):  12.0 were identified as respiratory-related arousals (3.4 /h), 0 were PLM-related arousals (0.0 /h), and 29 were non-specific arousals (8.1 /h)   EKG :Analysis of electrocardiogram activity showed the highest heart rate for the baseline portion of the study was 81.0 beats per minute.  The average heart rate during sleep was 62  bpm, while the highest heart rate for the same period was 77 bpm.    TREATMENT ANALYSIS  SLEEP CONTINUITY AND SLEEP ARCHITECTURE:  The treatment portion of the study began at 01:11 and ended at 04:25, for a recording time of 3h 13.68mminutes.  Total sleep time was 106 minutes (0.0% supine;  100.0% lateral; 0.0% prone, 52.4% REM sleep), with a decreased sleep efficiency at 54.9%. Sleep latency was increased at 49.5 minutes. REM sleep latency was normal at 88.0 minutes.  Arousal index was 7.4 /hr.   Of the total sleep time, the percentage of stage N1 sleep was 6.1%, stage N3 sleep was 0.0%, and REM sleep was 52.4%. There were 1 Stage R periods observed during this portion of the study, 4 awakenings (i.e. transitions to Stage W from any sleep stage), and 15.0 total stage transitions. Wake after sleep onset (WASO) time accounted for 37 minutes.   AROUSAL: There were 13.0 arousals in total, for an arousal index of 6.8 arousals/hour.  Of these, 3.0 were identified as respiratory-related arousals (1.7 /h), 0 were PLM-related arousals (0.0 /h), and 10 were non-specific arousals (5.7 /h)  RESPIRATORY MONITORING:    While on PAP therapy, based on AASM criteria, the apnea-hypopnea index was 4.0 overall (0.0 supine; 0.6 REM). BODY POSITION: Duration of total sleep and percent of total sleep in their respective position is as follows: supine 00 minutes (0.0%), non-supine 106.0 minutes (100.0%); right 01 minutes (0.9%), left 105 minutes (99.1%), and prone 00 minutes (0.0%). Total supine REM sleep time was 00 minutes (0.0% of total REM sleep).   OXIMETRY: Total sleep time spent at, or below 88% was 0.0 minutes, or 0.0% of total sleep time.  Respiratory events were associated with oxyhemoglobin desaturation (nadir 91.0%) from a mean of 98.0%.  Total time spent at, or below 88% was 3.3 minutes, or 3.1% of total sleep time.   LIMB MOVEMENTS: There were 0 periodic limb movements of sleep (0.0/h), of which 0 (0.0/h)  were associated with an arousal.    EKG: Analysis of electrocardiogram activity showed the highest heart rate for the treatment portion of the study was 92.0 beats per minute.  The average heart rate during sleep was 60 bpm, while the highest heart rate for the same period was 71 bpm.   EEG: symmetric and of normal amplitude.   IMPRESSION: Moderate severe OSA with mostly hypopneas was recorded and not associated with hypoxia.  CPAP was initiated and titrated to a final pressure of 8 cm water, all titration in non- supine sleep. AHI was 2/h at goal setting. The technologist used a Vitera FFM in small size.     RECOMMENDATIONS: ResMed auto CPAP between 6 and 16 cm water, 2 cm EPR and above named mask, heated humidification.     CLarey Seat MD

## 2021-12-07 ENCOUNTER — Telehealth: Payer: Self-pay | Admitting: *Deleted

## 2021-12-07 NOTE — Telephone Encounter (Signed)
-----   Message from Larey Seat, MD sent at 12/06/2021  3:01 PM EDT ----- IMPRESSION: Moderate severe OSA with mostly hypopneas was recorded and not associated with hypoxia.  CPAP was initiated and titrated to a final pressure of 8 cm water, all titration in non- supine sleep. AHI was 2/h at goal setting. The technologist used a Vitera FFM in small size.    RECOMMENDATIONS: ResMed auto CPAP between 6 and 16 cm water, 2 cm EPR and above named mask, heated humidification.

## 2021-12-07 NOTE — Telephone Encounter (Signed)
LVM for pt to call about results. °

## 2021-12-08 NOTE — Telephone Encounter (Signed)
I called Chelsea Henson. I advised Chelsea Henson that Dr. Brett Fairy reviewed their sleep study results and found that Chelsea Henson has sleep apnea. Dr. Brett Fairy recommends that Chelsea Henson start CPAP. I reviewed PAP compliance expectations with the Chelsea Henson. Chelsea Henson is agreeable to starting a CPAP. I advised Chelsea Henson that an order will be sent to a DME, [employer], and [employer] will call the Chelsea Henson within about one week after they file with the Chelsea Henson's insurance. [employer] will show the Chelsea Henson how to use the machine, fit for masks, and troubleshoot the CPAP if needed. A follow up appt was made for insurance purposes with Cristal Generous on 02/26/2022 at 1:45pm. I provided her [employer]'s phone# [phone] to call if she does not get a call from them in the next 1-2 weeks. Chelsea Henson verbalized understanding of results. Chelsea Henson had no questions at this time but was encouraged to call back if questions arise. I have sent the order to [employer] and have received confirmation that they have received the order.  She also asked about weight loss drug Dr. Brett Fairy recommended at last visit. I reviewed note and advised it appears Dr. Brett Fairy referred her to weight loss center/[facility] Leafy Ro, MD. I provided her their phone #: [phone] to call and schedule an appt. Asked her to call back if she has any difficulty scheduling an appt.

## 2021-12-13 ENCOUNTER — Ambulatory Visit
Admission: RE | Admit: 2021-12-13 | Discharge: 2021-12-13 | Disposition: A | Discharge status: Home / Self Care | Payer: Medicaid Other | Source: Ambulatory Visit | Attending: Family Medicine | Admitting: Family Medicine

## 2021-12-13 ENCOUNTER — Other Ambulatory Visit: Payer: Medicaid Other

## 2021-12-13 DIAGNOSIS — N6311 Unspecified lump in the right breast, upper outer quadrant: Secondary | ICD-10-CM | POA: Diagnosis not present

## 2021-12-13 DIAGNOSIS — R928 Other abnormal and inconclusive findings on diagnostic imaging of breast: Secondary | ICD-10-CM

## 2021-12-13 DIAGNOSIS — N6313 Unspecified lump in the right breast, lower outer quadrant: Secondary | ICD-10-CM | POA: Diagnosis not present

## 2021-12-13 DIAGNOSIS — R92322 Mammographic fibroglandular density, left breast: Secondary | ICD-10-CM | POA: Diagnosis not present

## 2022-01-03 ENCOUNTER — Other Ambulatory Visit (HOSPITAL_COMMUNITY)
Admission: RE | Admit: 2022-01-03 | Discharge: 2022-01-03 | Disposition: A | Discharge status: Home / Self Care | Payer: Medicaid Other | Source: Ambulatory Visit | Attending: Obstetrics & Gynecology | Admitting: Obstetrics & Gynecology

## 2022-01-03 ENCOUNTER — Ambulatory Visit: Payer: Medicaid Other | Admitting: Obstetrics & Gynecology

## 2022-01-03 ENCOUNTER — Encounter: Payer: Self-pay | Admitting: Obstetrics & Gynecology

## 2022-01-03 VITALS — BP 131/84 | HR 62 | Wt 227.0 lb

## 2022-01-03 DIAGNOSIS — B370 Candidal stomatitis: Secondary | ICD-10-CM | POA: Diagnosis not present

## 2022-01-03 DIAGNOSIS — Z113 Encounter for screening for infections with a predominantly sexual mode of transmission: Secondary | ICD-10-CM

## 2022-01-03 DIAGNOSIS — N760 Acute vaginitis: Secondary | ICD-10-CM | POA: Insufficient documentation

## 2022-01-03 MED ORDER — NYSTATIN 100000 UNIT/ML MT SUSP: 5.0000 mL | Freq: Four times a day (QID) | OROMUCOSAL | 2 refills | Status: DC

## 2022-01-03 MED ORDER — FLUCONAZOLE 150 MG PO TABS: 150.0000 mg | ORAL_TABLET | ORAL | 3 refills | Status: DC

## 2022-01-03 NOTE — Progress Notes (Signed)
GYNECOLOGY OFFICE VISIT NOTE  History:   Chelsea Henson is a 46 y.o. F here today for evaluation and management of abnormal vaginal odor and vaginal discharge.  Had unprotected oral and vaginal intercourse over the weekend, symptoms started two days ago.  Desires STI screening too. Reports having thick white patches in mouth, concerned that her partner had oral thrush. She denies any abnormal vaginal bleeding, pelvic pain or other concerns.    Past Medical History:  Diagnosis Date   Abnormal uterine bleeding    Allergy    Bacterial vaginosis    Chest pain of uncertain etiology 89/37/3428   Enlarged thyroid    Fibroids    Prediabetes    Recurrent vaginitis 01/23/2019   Seasonal allergies    Snores    Tobacco abuse     Past Surgical History:  Procedure Laterality Date   CESAREAN SECTION     X3   DILATATION & CURETTAGE/HYSTEROSCOPY WITH MYOSURE N/A 05/22/2019   Procedure: DILATATION & CURETTAGE/HYSTEROSCOPY WITH MYOSURE;  Surgeon: Osborne Oman, MD;  Location: [facility];  Service: Gynecology;  Laterality: N/A;   HYSTEROSCOPY N/A 05/22/2019   Procedure: Hysteroscopy with Hydrothermal Ablation ;  Surgeon: Osborne Oman, MD;  Location: [place];  Service: Gynecology;  Laterality: N/A;   TUBAL LIGATION      The following portions of the patient's history were reviewed and updated as appropriate: allergies, current medications, past family history, past medical history, past social history, past surgical history and problem list.   Health Maintenance:  Normal pap and negative HRHPV on 02/21/2019.  Normal mammogram on 12/13/2021.   Review of Systems:  Pertinent items noted in HPI and remainder of comprehensive ROS otherwise negative.  Physical Exam:  BP 131/84   Pulse 62   Wt 227 lb (103 kg)   LMP 03/29/2019 Comment: bleeding ongoing   BMI 37.77 kg/m  CONSTITUTIONAL: Well-developed, well-nourished female in no acute distress.   HEENT:  Normocephalic, atraumatic. External right and left ear normal. No scleral icterus.  MOUTH: Thick white plaques noted on tongue NECK: Normal range of motion, supple, no masses noted on observation SKIN: No rash noted. Not diaphoretic. No erythema. No pallor. MUSCULOSKELETAL: Normal range of motion. No edema noted. NEUROLOGIC: Alert and oriented to person, place, and time. Normal muscle tone coordination. No cranial nerve deficit noted. PSYCHIATRIC: Normal mood and affect. Normal behavior. Normal judgment and thought content. CARDIOVASCULAR: Normal heart rate noted RESPIRATORY: Effort and breath sounds normal, no problems with respiration noted ABDOMEN: No masses noted. No other overt distention noted.   PELVIC: Normal appearing external genitalia; normal urethral meatus; normal appearing vaginal mucosa and cervix.  No lesions.  White discharge noted, testing sample obtained.   Performed in the presence of a chaperone    Assessment and Plan:     1. Routine screening for STI (sexually transmitted infection) Patient cautioned that STIs have a long incubation time, this will just be her baseline test. If concerned about STI expo sire, she will need retest in about 3 months.  - Cervicovaginal ancillary only( ) - RPR+HBsAg+HCVAb+HIV  2. Oral thrush Will be treated for oral yeast infection - nystatin (MYCOSTATIN) 100000 UNIT/ML suspension; Take 5 mLs (500,000 Units total) by mouth 4 (four) times daily.  Dispense: 200 mL; Refill: 2 - fluconazole (DIFLUCAN) 150 MG tablet; Take 1 tablet (150 mg total) by mouth every 3 (three) days. For three doses  Dispense: 3 tablet; Refill: 3  3.  Vulvovaginitis - Cervicovaginal ancillary only( [place]) done, will follow up results and manage accordingly.  Of note, patient prefers vaginal creams for treatment of vaginitis.  Proper vulvar hygiene emphasized: discussed avoidance of perfumed soaps, detergents, lotions and any type of douches; in  addition to wearing cotton underwear and no underwear at night.  Also recommended cleaning front to back, voiding and cleaning up after intercourse.    Routine preventative health maintenance measures emphasized. Please refer to After Visit Summary for other counseling recommendations.   Return for any gynecologic concerns.    I spent 25 minutes dedicated to the care of this patient including pre-visit review of records, face to face time with the patient discussing her conditions and treatments and post visit orders.    Verita Schneiders, MD, [place] for [employer], [place]

## 2022-01-04 DIAGNOSIS — G4733 Obstructive sleep apnea (adult) (pediatric): Secondary | ICD-10-CM | POA: Diagnosis not present

## 2022-01-04 LAB — RPR+HBSAG+HCVAB+...
HIV Screen 4th Generation wRfx: NONREACTIVE
Hep C Virus Ab: NONREACTIVE
Hepatitis B Surface Ag: NEGATIVE
RPR Ser Ql: NONREACTIVE

## 2022-01-05 LAB — CERVICOVAGINAL ANCILLARY ONLY
Bacterial Vaginitis (gardnerella): NEGATIVE
Candida Glabrata: NEGATIVE
Candida Vaginitis: NEGATIVE
Chlamydia: NEGATIVE
Comment: NEGATIVE
Comment: NEGATIVE
Comment: NEGATIVE
Comment: NEGATIVE
Comment: NEGATIVE
Comment: NORMAL
Neisseria Gonorrhea: NEGATIVE
Trichomonas: NEGATIVE

## 2022-02-01 ENCOUNTER — Encounter (INDEPENDENT_AMBULATORY_CARE_PROVIDER_SITE_OTHER): Payer: Self-pay

## 2022-02-03 DIAGNOSIS — G4733 Obstructive sleep apnea (adult) (pediatric): Secondary | ICD-10-CM | POA: Diagnosis not present

## 2022-02-08 ENCOUNTER — Ambulatory Visit: Payer: Medicaid Other | Admitting: Obstetrics & Gynecology

## 2022-02-14 ENCOUNTER — Encounter: Payer: Self-pay | Admitting: Bariatrics

## 2022-02-14 ENCOUNTER — Encounter: Payer: Medicaid Other | Admitting: Bariatrics

## 2022-02-14 ENCOUNTER — Ambulatory Visit (INDEPENDENT_AMBULATORY_CARE_PROVIDER_SITE_OTHER): Payer: Medicaid Other | Admitting: Bariatrics

## 2022-02-14 VITALS — Ht 65.0 in | Wt 226.0 lb

## 2022-02-14 DIAGNOSIS — D500 Iron deficiency anemia secondary to blood loss (chronic): Secondary | ICD-10-CM | POA: Diagnosis not present

## 2022-02-14 DIAGNOSIS — R7303 Prediabetes: Secondary | ICD-10-CM

## 2022-02-14 DIAGNOSIS — R5383 Other fatigue: Secondary | ICD-10-CM

## 2022-02-14 DIAGNOSIS — E669 Obesity, unspecified: Secondary | ICD-10-CM

## 2022-02-14 DIAGNOSIS — Z6837 Body mass index (BMI) 37.0-37.9, adult: Secondary | ICD-10-CM

## 2022-02-14 DIAGNOSIS — E049 Nontoxic goiter, unspecified: Secondary | ICD-10-CM

## 2022-02-14 DIAGNOSIS — G4733 Obstructive sleep apnea (adult) (pediatric): Secondary | ICD-10-CM | POA: Insufficient documentation

## 2022-02-14 DIAGNOSIS — E66812 Obesity, class 2: Secondary | ICD-10-CM | POA: Insufficient documentation

## 2022-02-14 HISTORY — DX: Obstructive sleep apnea (adult) (pediatric): G47.33

## 2022-02-15 NOTE — Progress Notes (Deleted)
Patient: Chelsea Henson Date of Birth: 1975/04/02  Reason for Visit: Follow up History from: Patient Primary Neurologist: Dohmeier   ASSESSMENT AND PLAN 47 y.o. year old female     HISTORY OF PRESENT ILLNESS: Today 02/15/22 Here today for initial CPAP visit.  Referred due to morning dry mouth, snoring, daytime fatigue and tiredness. Labs at visit with Dr. Brett Fairy showed low iron saturation, ferritin was normal.  Recommended oral supplement.  11/22/21 In lab sleep study showed moderate to severe OSA.  CPAP initiated and titrated to a pressure of 8 cmH2O.  Reommended AutoPap 6-16. Set up date 01/04/22.  IMPRESSION: Moderate severe OSA with mostly hypopneas was  recorded and not associated with hypoxia.  CPAP was initiated and titrated to a final pressure of 8 cm  water, all titration in non- supine sleep. AHI was 2/h at goal  setting. The technologist used a Vitera FFM in small size.   HISTORY  09/22/21 Dr. Roxy Horseman: Chelsea Ovens Cula Henson is a 46 y.o. African American female patient seen here as a referral on 09/22/2021 from Dr Zigmund Daniel  for a sleep medicine consultation..  Chief concern according to patient :"  I wake up with a parched mouth, snore loudly and my kids have complaint. I am tired tired and sleepy all day , even after waking up in the morning. I can fall asleep anytime , anywhere. I can't finish a movie, I am off" - she proceeded to show my a video: Loud snoring. Witnessed apneas- "I gained weight since Covid and during Covid and that may have contributed, at least 15 pounds ". She just quit smoking!    Jiannah Mapes has a past medical history of Abnormal uterine bleeding, Allergy, Chest pain of uncertain etiology (123XX123), Enlarged thyroid, Fibroids, Prediabetes, Recurrent vaginitis (01/23/2019), Seasonal allergies, Snores, and Tobacco abuse.      Sleep relevant medical history: Nocturia every 1-2 hours of sleep( 5-6-7 times ) , Thyroid is enlarged.  deviated septum repair? UPPP?    Family medical /sleep history: night terrors in one son, grandson with enuresis, sleep walking,  no other family member on CPAP with OSA, suspects her brother to have it.  Social history:  Patient is working as Art therapist and lives in a household with 2 children , her grandson ( 65) through her 87 year old son.  her partner is only every 2 weeks at home. Mother of  3 children, .  The patient currently works daytime . Tobacco use- just quit .   ETOH use ; 1-2 glasses a week,  Caffeine intake in form of Coffee( in winter ) Soda( /) Tea ( lunch , dinner ) no energy drinks. Regular exercise- none .  Swimming in summer.  Hobbies : none    Sleep habits are as follows: The patient's dinner time is between 5-7 PM. The patient goes to bed at 10-11 PM and continues to sleep for short intervals of 1-2 hours, wakes for 5 plus  bathroom breaks, the first time at 12 AM. Bedroom is cool, quiet and dark.   The preferred sleep position is laterally, with the support of 1-2 pillows. She often feels hot , sweat, wakes up- Dreams are reportedly frequent/vivid. She reports body aches and stiffness.  5  AM is the usual rise time. The patient wakes up at 4.30 with an alarm, which she snoozes. .  She reports not feeling refreshed or restored in AM, with symptoms such as dry mouth(!), morning headaches(!), and  residual fatigue.  Naps are taken infrequently, usually on WE, lasting from 20 to 120 minutes and are more refreshing.   REVIEW OF SYSTEMS: Out of a complete 14 system review of symptoms, the patient complains only of the following symptoms, and all other reviewed systems are negative.  See HPI  ALLERGIES: No Known Allergies  HOME MEDICATIONS: Outpatient Medications Prior to Visit  Medication Sig Dispense Refill   acetaminophen (TYLENOL) 500 MG tablet Take 500 mg by mouth daily as needed (pain).     benzonatate (TESSALON PERLES) 100 MG capsule Take 1 capsule (100 mg  total) by mouth 3 (three) times daily as needed for cough. 20 capsule 0   diphenhydrAMINE (BENADRYL) 25 MG tablet Take 25 mg by mouth daily as needed for itching.     fluconazole (DIFLUCAN) 150 MG tablet Take 1 tablet (150 mg total) by mouth every 3 (three) days. For three doses 3 tablet 3   fluticasone (FLONASE) 50 MCG/ACT nasal spray Place into both nostrils as needed for allergies or rhinitis.     levocetirizine (XYZAL) 5 MG tablet Take 1 tablet (5 mg total) by mouth every evening. 30 tablet 5   nystatin (MYCOSTATIN) 100000 UNIT/ML suspension Take 5 mLs (500,000 Units total) by mouth 4 (four) times daily. 200 mL 2   ondansetron (ZOFRAN-ODT) 4 MG disintegrating tablet Take 1 tablet (4 mg total) by mouth every 8 (eight) hours as needed for nausea or vomiting. 20 tablet 0   No facility-administered medications prior to visit.    PAST MEDICAL HISTORY: Past Medical History:  Diagnosis Date   Abnormal uterine bleeding    Allergy    Bacterial vaginosis    Chest pain of uncertain etiology 123XX123   Enlarged thyroid    Fibroids    OSA (obstructive sleep apnea) 02/14/2022   Prediabetes    Recurrent vaginitis 01/23/2019   Seasonal allergies    Snores    Tobacco abuse     PAST SURGICAL HISTORY: Past Surgical History:  Procedure Laterality Date   CESAREAN SECTION     X3   DILATATION & CURETTAGE/HYSTEROSCOPY WITH MYOSURE N/A 05/22/2019   Procedure: DILATATION & CURETTAGE/HYSTEROSCOPY WITH MYOSURE;  Surgeon: Osborne Oman, MD;  Location: [facility];  Service: Gynecology;  Laterality: N/A;   HYSTEROSCOPY N/A 05/22/2019   Procedure: Hysteroscopy with Hydrothermal Ablation ;  Surgeon: Osborne Oman, MD;  Location: [facility];  Service: Gynecology;  Laterality: N/A;   TUBAL LIGATION      FAMILY HISTORY: Family History  Problem Relation Age of Onset   Colon polyps Mother    Hypertension Mother    Diabetes Sister    Depression Paternal Grandmother     Allergic rhinitis Neg Hx    Angioedema Neg Hx    Asthma Neg Hx    Eczema Neg Hx    Immunodeficiency Neg Hx    Urticaria Neg Hx    Colon cancer Neg Hx    Esophageal cancer Neg Hx    Rectal cancer Neg Hx    Stomach cancer Neg Hx     SOCIAL HISTORY: Social History   Socioeconomic History   Marital status: Married    Spouse name: Not on file   Number of children: Not on file   Years of education: Not on file   Highest education level: Not on file  Occupational History   Not on file  Tobacco Use   Smoking status: Some Days    Packs/day: 0.25  Types: Cigarettes   Smokeless tobacco: Never  Substance and Sexual Activity   Alcohol use: Yes    Comment: occ   Drug use: Yes    Types: Marijuana    Comment: last used 2 weeks ago   Sexual activity: Not on file  Other Topics Concern   Not on file  Social History Narrative   Not on file   Social Determinants of Health   Financial Resource Strain: Not on file  Food Insecurity: Not on file  Transportation Needs: Not on file  Physical Activity: Not on file  Stress: Not on file  Social Connections: Not on file  Intimate Partner Violence: Not on file    PHYSICAL EXAM  There were no vitals filed for this visit. There is no height or weight on file to calculate BMI.  Generalized: Well developed, in no acute distress  Neurological examination  Mentation: Alert oriented to time, place, history taking. Follows all commands speech and language fluent Cranial nerve II-XII: Pupils were equal round reactive to light. Extraocular movements were full, visual field were full on confrontational test. Facial sensation and strength were normal. Uvula tongue midline. Head turning and shoulder shrug  were normal and symmetric. Motor: The motor testing reveals 5 over 5 strength of all 4 extremities. Good symmetric motor tone is noted throughout.  Sensory: Sensory testing is intact to soft touch on all 4 extremities. No evidence of  extinction is noted.  Coordination: Cerebellar testing reveals good finger-nose-finger and heel-to-shin bilaterally.  Gait and station: Gait is normal. Tandem gait is normal. Romberg is negative. No drift is seen.  Reflexes: Deep tendon reflexes are symmetric and normal bilaterally.   DIAGNOSTIC DATA (LABS, IMAGING, TESTING) - I reviewed patient records, labs, notes, testing and imaging myself where available.  Lab Results  Component Value Date   WBC 14.2 (H) 11/08/2021   HGB 13.6 11/08/2021   HCT 41.8 11/08/2021   MCV 88.6 11/08/2021   PLT 226 11/08/2021      Component Value Date/Time   NA 137 11/08/2021 1404   NA 140 04/17/2020 1049   K 4.3 11/08/2021 1404   CL 101 11/08/2021 1404   CO2 26 11/08/2021 1404   GLUCOSE 109 (H) 11/08/2021 1404   BUN 11 11/08/2021 1404   BUN 13 04/17/2020 1049   CREATININE 0.76 11/08/2021 1404   CALCIUM 9.2 11/08/2021 1404   PROT 6.8 11/08/2021 1404   ALBUMIN 3.6 11/08/2021 1404   AST 26 11/08/2021 1404   ALT 26 11/08/2021 1404   ALKPHOS 76 11/08/2021 1404   BILITOT 0.9 11/08/2021 1404   GFRNONAA >60 11/08/2021 1404   GFRAA >60 07/28/2019 1523   Lab Results  Component Value Date   CHOL 140 04/02/2021   HDL 31 (L) 04/02/2021   LDLCALC 88 04/02/2021   TRIG 117 04/02/2021   CHOLHDL 4.5 (H) 04/02/2021   Lab Results  Component Value Date   HGBA1C 6.1 04/02/2021   No results found for: "VITAMINB12" Lab Results  Component Value Date   TSH 0.433 (L) 07/30/2021    Butler Denmark, AGNP-C, DNP 02/15/2022, 3:58 PM [facility] [address] [place], [place] [address] [phone]

## 2022-02-16 ENCOUNTER — Telehealth: Payer: Self-pay | Admitting: Neurology

## 2022-02-16 ENCOUNTER — Telehealth: Payer: Medicaid Other | Admitting: Neurology

## 2022-02-16 NOTE — Telephone Encounter (Signed)
LVM and sent mychart msg advising pt of appt r/s- not enough CPAP data per pod 2.

## 2022-02-24 NOTE — Progress Notes (Signed)
Office: [phone]  /  Fax: [phone]  Initial Visit  Chelsea Henson was seen in clinic today to evaluate for obesity. She is interested in losing weight to improve overall health and reduce the risk of weight related complications. She presents today to review program treatment options, initial physical assessment, and evaluation.     She was referred by: PCP  When asked what else they would they like to accomplish? She states: Improve existing medical conditions and Lose a target amount of weight : 50 lbs  When asked how has your weight affected you? She states: Having fatigue and Having poor endurance  Some associated conditions: OSA and Prediabetes  Contributing factors: Stress, Reduced physical activity, and Other: sleep apnea  Weight promoting medications identified: None  Current nutrition plan: None  Current level of physical activity: Other: walking at work  Current or previous pharmacotherapy: None and Other: Does not want to take medication.  Response to medication: Never tried medications   Past medical history includes:   Past Medical History:  Diagnosis Date   Abnormal uterine bleeding    Allergy    Bacterial vaginosis    Chest pain of uncertain etiology 84/16/6063   Enlarged thyroid    Fibroids    OSA (obstructive sleep apnea) 02/14/2022   Prediabetes    Recurrent vaginitis 01/23/2019   Seasonal allergies    Snores    Tobacco abuse      Objective:   Ht '5\' 5"'$  (1.651 m)   Wt 226 lb (102.5 kg)   LMP 03/29/2019 Comment: bleeding ongoing   BMI 37.61 kg/m  She was weighed on the bioimpedance scale: Body mass index is 37.61 kg/m.  Visceral Fat Rating:7, Body Fat%:27.6  General:  Alert, oriented and cooperative. Patient is in no acute distress.  Respiratory: Normal respiratory effort, no problems with respiration noted  Extremities: Normal range of motion.    Mental Status: Normal mood and affect. Normal behavior. Normal judgment and thought  content.   Assessment and Plan:  1. Enlarged thyroid Chelsea Henson has seen her endocrinologist.  PCP will follow our plan.  2. Iron deficiency anemia due to chronic blood loss We will check anemia panel if she comes for a visit.  3. Prediabetes Pt is not on medication.  She will keep carbohydrates low (sugars and starches).  - EKG 12-Lead; Future  4. OSA (obstructive sleep apnea) Chelsea Henson is adjusting to the CPAP and resting better.  Plan on wearing the CPAP.  5. Other fatigue Pt endorses fatigue with certain activities.  We will do EKG if pt comes back for visit.  - EKG 12-Lead; Future  6. Obesity, Current BMI 37.7 Will return to clinic in 2 weeks for EKG and IC. Start collagen supplement, increase protein, and exercise. Pt will stop eating late at night. Plant based foods.    Obesity Treatment Plan:  She will work on garnering support from family and friends to begin weight loss journey. Work on eliminating or reducing the presence of highly processed, calorie dense foods in the home. Complete provided nutritional and psychosocial assessment questionnaire prior to her next visit.  Assess for cardiometabolic complications and nutritional deficiencies via fasting serologies.  Assess REE via indirect calorimetry to guide the creation of a reduced calorie, high protein meal plan to promote loss of fat mass.   She will think about ideas on how to incorporate physical activity in their daily routine.  Chelsea Henson will follow up in the next 1-2 weeks to review  the above steps and continue evaluation and treatment.  Obesity Education Performed Today:  She was weighed on the bioimpedance scale and results were discussed and documented in the synopsis.  We discussed obesity as a disease and the importance of a more detailed evaluation of all the factors contributing to the disease.  We discussed the importance of long term lifestyle changes which include nutrition,  exercise and behavioral modifications as well as the importance of customizing this to her specific health and social needs.  We discussed the benefits of reaching a healthier weight to alleviate the symptoms of existing conditions and reduce the risks of the biomechanical, metabolic and psychological effects of obesity.  Chelsea Henson appears to be in the action stage of change and states they are ready to start intensive lifestyle modifications and behavioral modifications.  I, Kathlene November, BS, CMA, am acting as transcriptionist for [employer], DO.  Jearld Lesch, DO

## 2022-02-28 ENCOUNTER — Encounter: Payer: Self-pay | Admitting: Bariatrics

## 2022-02-28 ENCOUNTER — Ambulatory Visit: Payer: Medicaid Other | Admitting: Bariatrics

## 2022-02-28 VITALS — BP 137/85 | HR 60 | Temp 97.9°F | Ht 65.0 in | Wt 230.0 lb

## 2022-02-28 DIAGNOSIS — E559 Vitamin D deficiency, unspecified: Secondary | ICD-10-CM | POA: Diagnosis not present

## 2022-02-28 DIAGNOSIS — E049 Nontoxic goiter, unspecified: Secondary | ICD-10-CM

## 2022-02-28 DIAGNOSIS — Z0289 Encounter for other administrative examinations: Secondary | ICD-10-CM

## 2022-02-28 DIAGNOSIS — R0602 Shortness of breath: Secondary | ICD-10-CM | POA: Diagnosis not present

## 2022-02-28 DIAGNOSIS — G4733 Obstructive sleep apnea (adult) (pediatric): Secondary | ICD-10-CM

## 2022-02-28 DIAGNOSIS — E669 Obesity, unspecified: Secondary | ICD-10-CM

## 2022-02-28 DIAGNOSIS — R5383 Other fatigue: Secondary | ICD-10-CM

## 2022-02-28 DIAGNOSIS — Z6838 Body mass index (BMI) 38.0-38.9, adult: Secondary | ICD-10-CM

## 2022-02-28 DIAGNOSIS — Z1331 Encounter for screening for depression: Secondary | ICD-10-CM

## 2022-02-28 DIAGNOSIS — R7303 Prediabetes: Secondary | ICD-10-CM | POA: Diagnosis not present

## 2022-02-28 DIAGNOSIS — Z0000 Encounter for general adult medical examination without abnormal findings: Secondary | ICD-10-CM | POA: Diagnosis not present

## 2022-03-01 ENCOUNTER — Encounter (INDEPENDENT_AMBULATORY_CARE_PROVIDER_SITE_OTHER): Payer: Self-pay | Admitting: Bariatrics

## 2022-03-01 DIAGNOSIS — E786 Lipoprotein deficiency: Secondary | ICD-10-CM | POA: Insufficient documentation

## 2022-03-01 LAB — COMPREHENSIVE METABOLIC PANEL
ALT: 29 IU/L (ref 0–32)
AST: 21 IU/L (ref 0–40)
Albumin/Globulin Ratio: 1.8 (ref 1.2–2.2)
Albumin: 4.3 g/dL (ref 3.9–4.9)
Alkaline Phosphatase: 100 IU/L (ref 44–121)
BUN/Creatinine Ratio: 18 (ref 9–23)
BUN: 12 mg/dL (ref 6–24)
Bilirubin Total: 0.3 mg/dL (ref 0.0–1.2)
CO2: 25 mmol/L (ref 20–29)
Calcium: 9.2 mg/dL (ref 8.7–10.2)
Chloride: 103 mmol/L (ref 96–106)
Creatinine, Ser: 0.66 mg/dL (ref 0.57–1.00)
Globulin, Total: 2.4 g/dL (ref 1.5–4.5)
Glucose: 93 mg/dL (ref 70–99)
Potassium: 5 mmol/L (ref 3.5–5.2)
Sodium: 140 mmol/L (ref 134–144)
Total Protein: 6.7 g/dL (ref 6.0–8.5)
eGFR: 109 mL/min/{1.73_m2} (ref >59)

## 2022-03-01 LAB — LIPID PANEL WITH LDL/HDL RATIO
Cholesterol, Total: 155 mg/dL (ref 100–199)
HDL: 36 mg/dL — ABNORMAL LOW (ref >39)
LDL Chol Calc (NIH): 93 mg/dL (ref 0–99)
LDL/HDL Ratio: 2.6 ratio (ref 0.0–3.2)
Triglycerides: 149 mg/dL (ref 0–149)
VLDL Cholesterol Cal: 26 mg/dL (ref 5–40)

## 2022-03-01 LAB — HEMOGLOBIN A1C
Est. average glucose Bld gHb Est-mCnc: 128 mg/dL
Hgb A1c MFr Bld: 6.1 % — ABNORMAL HIGH (ref 4.8–5.6)

## 2022-03-01 LAB — TSH+T4F+T3FREE
Free T4: 1.32 ng/dL (ref 0.82–1.77)
T3, Free: 3.4 pg/mL (ref 2.0–4.4)
TSH: 0.685 u[IU]/mL (ref 0.450–4.500)

## 2022-03-01 LAB — INSULIN, RANDOM: INSULIN: 19.9 u[IU]/mL (ref 2.6–24.9)

## 2022-03-01 LAB — VITAMIN D 25 HYDROXY (VIT D DEFICIENCY, FRACTURES): Vit D, 25-Hydroxy: 15.8 ng/mL — ABNORMAL LOW (ref 30.0–100.0)

## 2022-03-06 DIAGNOSIS — G4733 Obstructive sleep apnea (adult) (pediatric): Secondary | ICD-10-CM | POA: Diagnosis not present

## 2022-03-14 ENCOUNTER — Ambulatory Visit: Payer: Medicaid Other | Admitting: Bariatrics

## 2022-03-14 ENCOUNTER — Encounter: Payer: Self-pay | Admitting: Bariatrics

## 2022-03-14 VITALS — BP 127/76 | HR 64 | Temp 97.9°F | Ht 65.0 in | Wt 222.0 lb

## 2022-03-14 DIAGNOSIS — E669 Obesity, unspecified: Secondary | ICD-10-CM | POA: Diagnosis not present

## 2022-03-14 DIAGNOSIS — R7303 Prediabetes: Secondary | ICD-10-CM | POA: Diagnosis not present

## 2022-03-14 DIAGNOSIS — E786 Lipoprotein deficiency: Secondary | ICD-10-CM | POA: Diagnosis not present

## 2022-03-14 DIAGNOSIS — E559 Vitamin D deficiency, unspecified: Secondary | ICD-10-CM

## 2022-03-14 DIAGNOSIS — Z6837 Body mass index (BMI) 37.0-37.9, adult: Secondary | ICD-10-CM

## 2022-03-14 DIAGNOSIS — E88819 Insulin resistance, unspecified: Secondary | ICD-10-CM

## 2022-03-14 MED ORDER — VITAMIN D (ERGOCALCIFEROL) 1.25 MG (50000 UNIT) PO CAPS: 50000.0000 [IU] | ORAL_CAPSULE | ORAL | 0 refills | Status: DC

## 2022-03-14 NOTE — Progress Notes (Unsigned)
Chief Complaint:   OBESITY Chelsea Henson (MR# [id number]) is {a/an:23460} 47 y.o. female who presents for evaluation and treatment of obesity and related comorbidities. Current BMI is Body mass index is 38.27 kg/m. Chelsea Henson has been struggling with her weight for many years and has been unsuccessful in either losing weight, maintaining weight loss, or reaching her healthy weight goal.  Chelsea Henson is currently in the action stage of change and ready to dedicate time achieving and maintaining a healthier weight. Chelsea Henson is interested in becoming our patient and working on intensive lifestyle modifications including (but not limited to) diet and exercise for weight loss.  Chelsea Henson's habits were reviewed today and are as follows: {MWM WT HABITS:23461}.  Depression Screen Chelsea Henson's Food and Mood (modified PHQ-9) score was ***.  Subjective:   1. Other fatigue Chelsea Henson {Actions; denies/reports/admits to:19208} daytime somnolence and {Actions; denies/reports/admits to:19208} waking up still tired. Patient has a history of symptoms of {OSA Sx:17850}. Chelsea Henson generally gets {numbers (fuzzy):14653} hours of sleep per night, and states that she has {sleep quality:17851}. Snoring {is/are not:32546} present. Apneic episodes {is/are not:32546} present. Epworth Sleepiness Score is ***.   2. SOB (shortness of breath) on exertion Caidence notes increasing shortness of breath with exercising and seems to be worsening over time with weight gain. She notes getting out of breath sooner with activity than she used to. This has not gotten worse recently. [name] denies shortness of breath at rest or orthopnea.  3. Prediabetes ***  4. Enlarged thyroid ***  5. OSA (obstructive sleep apnea) ***  6. Health care maintenance Given obesity.   7. Vitamin D deficiency ***  Assessment/Plan:   1. Other fatigue Chelsea Henson does feel that her weight is causing her energy to be lower than it should be. Fatigue  may be related to obesity, depression or many other causes. Labs will be ordered, and in the meanwhile, Chelsea Henson will focus on self care including making healthy food choices, increasing physical activity and focusing on stress reduction.  - EKG 12-Lead - TSH+T4F+T3Free  2. SOB (shortness of breath) on exertion Jaila does feel that she gets out of breath more easily that she used to when she exercises. Evann's shortness of breath appears to be obesity related and exercise induced. She has agreed to work on weight loss and gradually increase exercise to treat her exercise induced shortness of breath. Will continue to monitor closely.  - TSH+T4F+T3Free  3. Prediabetes *** - Insulin, random - Hemoglobin A1c - Comprehensive metabolic panel  4. Enlarged thyroid ***  5. OSA (obstructive sleep apnea) ***  6. Health care maintenance *** - TSH+T4F+T3Free - VITAMIN D 25 Hydroxy (Vit-D Deficiency, Fractures) - Insulin, random - Lipid Panel With LDL/HDL Ratio - Hemoglobin A1c - Comprehensive metabolic panel  7. Vitamin D deficiency *** - VITAMIN D 25 Hydroxy (Vit-D Deficiency, Fractures)  8. Depression screening Honesty had a positive depression screening. Depression is commonly associated with obesity and often results in emotional eating behaviors. We will monitor this closely and work on CBT to help improve the non-hunger eating patterns. Referral to Psychology may be required if no improvement is seen as she continues in our clinic.  9. Obesity, Current BMI 38.3 Chelsea Henson {CHL AMB IS/IS NOT:210130109} currently in the action stage of change and her goal is to {MWMwtloss#1:210800005}. I recommend Chelsea Henson begin the structured treatment plan as follows:  She has agreed to {MWMwtlossportion/plan2:23431}.  Exercise goals: {MWM EXERCISE RECS:23473}   Behavioral modification strategies: {MWMwtlossdietstrategies3:23432}.  She was  informed of the importance of frequent follow-up visits  to maximize her success with intensive lifestyle modifications for her multiple health conditions. She was informed we would discuss her lab results at her next visit unless there is a critical issue that needs to be addressed sooner. Chelsea Henson agreed to keep her next visit at the agreed upon time to discuss these results.  Objective:   Blood pressure 137/85, pulse 60, temperature 97.9 F (36.6 C), height '5\' 5"'$  (1.651 m), weight 230 lb (104.3 kg), last menstrual period 03/29/2019, SpO2 99 %. Body mass index is 38.27 kg/m.  EKG: Normal sinus rhythm, rate ***.  Indirect Calorimeter completed today shows a VO2 of *** and a REE of ***.  Her calculated basal metabolic rate is *** thus her basal metabolic rate is {DESC; VZCHYI/FOYDX:41287} than expected.  General: Cooperative, alert, well developed, in no acute distress. HEENT: Conjunctivae and lids unremarkable. Cardiovascular: Regular rhythm.  Lungs: Normal work of breathing. Neurologic: No focal deficits.   Lab Results  Component Value Date   CREATININE 0.66 02/28/2022   BUN 12 02/28/2022   NA 140 02/28/2022   K 5.0 02/28/2022   CL 103 02/28/2022   CO2 25 02/28/2022   Lab Results  Component Value Date   ALT 29 02/28/2022   AST 21 02/28/2022   ALKPHOS 100 02/28/2022   BILITOT 0.3 02/28/2022   Lab Results  Component Value Date   HGBA1C 6.1 (H) 02/28/2022   HGBA1C 6.1 04/02/2021   HGBA1C 6.0 (A) 02/21/2020   HGBA1C 5.9 (A) 05/29/2019   HGBA1C 5.8 (A) 09/17/2018   Lab Results  Component Value Date   INSULIN 19.9 02/28/2022   Lab Results  Component Value Date   TSH 0.685 02/28/2022   Lab Results  Component Value Date   CHOL 155 02/28/2022   HDL 36 (L) 02/28/2022   LDLCALC 93 02/28/2022   TRIG 149 02/28/2022   CHOLHDL 4.5 (H) 04/02/2021   Lab Results  Component Value Date   WBC 14.2 (H) 11/08/2021   HGB 13.6 11/08/2021   HCT 41.8 11/08/2021   MCV 88.6 11/08/2021   PLT 226 11/08/2021   Lab Results  Component  Value Date   IRON 42 09/22/2021   TIBC 360 09/22/2021   FERRITIN 60 09/22/2021   Attestation Statements:   Reviewed by clinician on day of visit: allergies, medications, problem list, medical history, surgical history, family history, social history, and previous encounter notes.   Wilhemena Durie, am acting as Location manager for [employer], DO.  I have reviewed the above documentation for accuracy and completeness, and I agree with the above. - ***

## 2022-03-15 ENCOUNTER — Encounter: Payer: Self-pay | Admitting: Bariatrics

## 2022-03-23 ENCOUNTER — Telehealth: Payer: Medicaid Other | Admitting: Neurology

## 2022-03-23 NOTE — Progress Notes (Deleted)
Virtual Visit via Video Note  I connected with Chelsea Henson on 03/23/22 at  1:45 PM EST by a video enabled telemedicine application and verified that I am speaking with the correct person using two identifiers.  Location: Patient: at her home  Provider: in the office    I discussed the limitations of evaluation and management by telemedicine and the availability of in person appointments. The patient expressed understanding and agreed to proceed.  History of Present Illness: Today, March 23, 2022 SS: Presented for sleep consultation due to reported snoring, morning dry mouth, daytime fatigue, frequent napping, witnessed apneas.  Had in lab sleep study 11/22/21, showed moderate to severe OSA, CPAP was initiated and titrated to a final pressure of 8 cmH2O. Set up date 01/04/22.  The Apnea index was 0.6/h . Hypopnea index was 20.0/h. The AHI  (apnea-hypopnea index) was 20.5/h overall (AHI was 15.2/h in  supine; AHI was 31.2/h in REM, and 31.6 supine REM). There were 0  respiratory effort-related arousals (RERAs).   Review of download 02/21/22-03/22/22 shows usage 13/30 days at 60%, greater than 4 hours 6 days at 20%.  Average usage days used 3 hours 20 minutes.  Minimum pressure 6, maximum pressure 16 cm water.  95th percentile pressure 11.6, leak 7.3, AHI 0.7  09/22/21 Dr. Brett Fairy: Chelsea Henson is a 47 y.o. African American female patient seen here as a referral on 09/22/2021 from Dr Zigmund Daniel  for a sleep medicine consultation..  Chief concern according to patient :"  I wake up with a parched mouth, snore loudly and my kids have complaint. I am tired tired and sleepy all day , even after waking up in the morning. I can fall asleep anytime , anywhere. I can't finish a movie, I am off" - she proceeded to show my a video: Loud snoring. Witnessed apneas- "I gained weight since Covid and during Covid and that may have contributed, at least 15 pounds ". She just quit smoking!     Chelsea Henson has a past medical history of Abnormal uterine bleeding, Allergy, Chest pain of uncertain etiology (123XX123), Enlarged thyroid, Fibroids, Prediabetes, Recurrent vaginitis (01/23/2019), Seasonal allergies, Snores, and Tobacco abuse.      Sleep relevant medical history: Nocturia every 1-2 hours of sleep( 5-6-7 times ) , Thyroid is enlarged. deviated septum repair? UPPP?    Family medical /sleep history: night terrors in one son, grandson with enuresis, sleep walking,  no other family member on CPAP with OSA, suspects her brother to have it.  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  her partner is only every 2 weeks at home. Mother of  3 children, .  The patient currently works daytime . Tobacco use- just quit .   ETOH use ; 1-2 glasses a week,  Caffeine intake in form of Coffee( in winter ) Soda( /) Tea ( lunch , dinner ) no energy drinks. Regular exercise- none .  Swimming in summer.  Hobbies : none      Sleep habits are as follows: The patient's dinner time is between 5-7 PM. The patient goes to bed at 10-11 PM and continues to sleep for short intervals of 1-2 hours, wakes for 5 plus  bathroom breaks, the first time at 12 AM. Bedroom is cool, quiet and dark.   The preferred sleep position is laterally, with the support of  1-2 pillows. She often feels hot , sweat, wakes up- Dreams are reportedly frequent/vivid. She reports body aches and stiffness.  5  AM is the usual rise time. The patient wakes up at 4.30 with an alarm, which she snoozes. .  She reports not feeling refreshed or restored in AM, with symptoms such as dry mouth(!), morning headaches(!), and residual fatigue.  Naps are taken infrequently, usually on WE, lasting from 20 to 120 minutes and are more refreshing.    Observations/Objective:   Assessment and Plan:   Follow Up Instructions:    I  discussed the assessment and treatment plan with the patient. The patient was provided an opportunity to ask questions and all were answered. The patient agreed with the plan and demonstrated an understanding of the instructions.   The patient was advised to call back or seek an in-person evaluation if the symptoms worsen or if the condition fails to improve as anticipated.  I provided *** minutes of non-face-to-face time during this encounter.   Suzzanne Cloud, NP

## 2022-03-26 NOTE — Progress Notes (Unsigned)
Chief Complaint:   OBESITY Chelsea Henson is here to discuss her progress with her obesity treatment plan along with follow-up of her obesity related diagnoses. Chelsea Henson is on the Category 2 Plan and states she is following her eating plan approximately 0% of the time. Chelsea Henson states she is going to the gym 60 minutes 3-4 times per week.  Today's visit was #: 2 Starting weight: 230 lbs Starting date: 02/28/2022 Today's weight: 222 lbs Today's date: 03/14/2022 Total lbs lost to date: 8 Total lbs lost since last in-office visit: 8  Interim History: Chelsea Henson is down 8 lbs since her first visit. She is making better meal choices.  Subjective:   1. Insulin resistance Chelsea Henson's insulin level is 19.9 with an A1c of 6.1.  2. Vitamin D deficiency Vitamin D level 15.8.  3. Prediabetes Chelsea Henson's A1c is 6.1 with an insulin level of 19.9.  4. Low HDL (under 40) Rest of cholesterol panel is within normal limits.  Assessment/Plan:   1. Insulin resistance Will minimize all carbohydrates (sweets and starches).  2. Vitamin D deficiency Start high dose Vitamin D 50,000 IU weekly.  Start- Vitamin D, Ergocalciferol, (DRISDOL) 1.25 MG (50000 UNIT) CAPS capsule; Take 1 capsule (50,000 Units total) by mouth every 7 (seven) days.  Dispense: 5 capsule; Refill: 0  3. Prediabetes Handouts: Pre-diabetes; Insulin Resistance  4. Low HDL (under 40) Lower carbohydrates Continue exercise.  5. Generalized obesity 6. BMI 37.0-37.9, adult Meal planning Review labs 02/28/2022 (CMP, lipids, Vit D, A1c, insulin, thyroid panel Increase water  Chelsea Henson is currently in the action stage of change. As such, her goal is to continue with weight loss efforts. She has agreed to keeping a food journal and adhering to recommended goals of 1200-1300 calories and 80-90 grams protein and practicing portion control and making smarter food choices, such as increasing vegetables and decreasing simple carbohydrates.    Exercise goals:  Going to the gym 3-4 days a week.  Behavioral modification strategies: increasing lean protein intake, decreasing simple carbohydrates, increasing vegetables, increasing water intake, decreasing eating out, no skipping meals, meal planning and cooking strategies, keeping healthy foods in the home, and planning for success.  Chelsea Henson has agreed to follow-up with our clinic in 2 weeks. She was informed of the importance of frequent follow-up visits to maximize her success with intensive lifestyle modifications for her multiple health conditions.   Objective:   Blood pressure 127/76, pulse 64, temperature 97.9 F (36.6 C), height 5' 5"$  (1.651 m), weight 222 lb (100.7 kg), last menstrual period 03/29/2019, SpO2 98 %. Body mass index is 36.94 kg/m.  General: Cooperative, alert, well developed, in no acute distress. HEENT: Conjunctivae and lids unremarkable. Cardiovascular: Regular rhythm.  Lungs: Normal work of breathing. Neurologic: No focal deficits.   Lab Results  Component Value Date   CREATININE 0.66 02/28/2022   BUN 12 02/28/2022   NA 140 02/28/2022   K 5.0 02/28/2022   CL 103 02/28/2022   CO2 25 02/28/2022   Lab Results  Component Value Date   ALT 29 02/28/2022   AST 21 02/28/2022   ALKPHOS 100 02/28/2022   BILITOT 0.3 02/28/2022   Lab Results  Component Value Date   HGBA1C 6.1 (H) 02/28/2022   HGBA1C 6.1 04/02/2021   HGBA1C 6.0 (A) 02/21/2020   HGBA1C 5.9 (A) 05/29/2019   HGBA1C 5.8 (A) 09/17/2018   Lab Results  Component Value Date   INSULIN 19.9 02/28/2022   Lab Results  Component Value Date  TSH 0.685 02/28/2022   Lab Results  Component Value Date   CHOL 155 02/28/2022   HDL 36 (L) 02/28/2022   LDLCALC 93 02/28/2022   TRIG 149 02/28/2022   CHOLHDL 4.5 (H) 04/02/2021   Lab Results  Component Value Date   VD25OH 15.8 (L) 02/28/2022   Lab Results  Component Value Date   WBC 14.2 (H) 11/08/2021   HGB 13.6 11/08/2021   HCT 41.8  11/08/2021   MCV 88.6 11/08/2021   PLT 226 11/08/2021   Lab Results  Component Value Date   IRON 42 09/22/2021   TIBC 360 09/22/2021   FERRITIN 60 09/22/2021   Attestation Statements:   Reviewed by clinician on day of visit: allergies, medications, problem list, medical history, surgical history, family history, social history, and previous encounter notes.  I, Kathlene November, BS, CMA, am acting as transcriptionist for [employer], DO.  I have reviewed the above documentation for accuracy and completeness, and I agree with the above. Jearld Lesch, DO

## 2022-03-28 ENCOUNTER — Ambulatory Visit: Payer: Medicaid Other | Admitting: Bariatrics

## 2022-03-28 ENCOUNTER — Encounter: Payer: Self-pay | Admitting: Bariatrics

## 2022-03-28 VITALS — BP 129/87 | HR 54 | Temp 98.1°F | Ht 65.0 in | Wt 219.0 lb

## 2022-03-28 DIAGNOSIS — R7303 Prediabetes: Secondary | ICD-10-CM | POA: Diagnosis not present

## 2022-03-28 DIAGNOSIS — E669 Obesity, unspecified: Secondary | ICD-10-CM

## 2022-03-28 DIAGNOSIS — Z6836 Body mass index (BMI) 36.0-36.9, adult: Secondary | ICD-10-CM | POA: Diagnosis not present

## 2022-03-28 DIAGNOSIS — E786 Lipoprotein deficiency: Secondary | ICD-10-CM | POA: Diagnosis not present

## 2022-03-29 ENCOUNTER — Encounter: Payer: Self-pay | Admitting: Neurology

## 2022-03-29 ENCOUNTER — Ambulatory Visit (INDEPENDENT_AMBULATORY_CARE_PROVIDER_SITE_OTHER): Payer: Medicaid Other | Admitting: Neurology

## 2022-03-29 ENCOUNTER — Telehealth: Payer: Self-pay

## 2022-03-29 VITALS — BP 122/77 | HR 64 | Ht 64.0 in | Wt 219.0 lb

## 2022-03-29 DIAGNOSIS — G4733 Obstructive sleep apnea (adult) (pediatric): Secondary | ICD-10-CM | POA: Diagnosis not present

## 2022-03-29 NOTE — Patient Instructions (Addendum)
DME: [facility] Phone: [phone], press option 1 Fax: [phone]  I will order mask refit from DME, please call them above I will pull a download in about 6 weeks  See you back in 6 months virtually

## 2022-03-29 NOTE — Telephone Encounter (Signed)
Sent community message to DME for CPAP orders

## 2022-03-29 NOTE — Progress Notes (Signed)
Patient: Chelsea Henson Date of Birth: 12/27/1975  Reason for Visit: Follow up for initial CPAP History from: Patient Primary Neurologist: Dr. Brett Fairy  ASSESSMENT AND PLAN 47 y.o. year old female   33.  OSA on CPAP -Motivated to improve usage and compliance, clearly feels better with CPAP use, I will order a mask refit, she would like to try a fullface mask -I will pull download in about 5 weeks to review the data, we discussed the importance of nightly use for greater than 4 hours for both clinical benefit and insurance requirements -I will schedule 77-monthfollow-up virtually, we can push this out to a year if she is doing well in a few weeks  HISTORY OF PRESENT ILLNESS: Today, March 23, 2022 SS: Presented for sleep consultation due to reported snoring, morning dry mouth, daytime fatigue, frequent napping, witnessed apneas.  Had in lab sleep study 11/22/21, showed moderate to severe OSA, CPAP was initiated and titrated to a final pressure of 8 cmH2O. Set up date 01/04/22.  The Apnea index was 0.6/h . Hypopnea index was 20.0/h. The AHI  (apnea-hypopnea index) was 20.5/h overall (AHI was 15.2/h in  supine; AHI was 31.2/h in REM, and 31.6 supine REM). There were 0  respiratory effort-related arousals (RERAs).   Review of download 02/27/22-03/28/22 usage 15/30 days at 50%, greater than 4 hours 8 days at 27%.  Average usage days used 4 hours 24 minutes.  Minimum pressure 6, maximum pressure 16 cm water.  Pressure 95th percentile 11.4.  Leak 6.3, AHI 0.6.  Here today for initial visit, doesn't have great sleep habits, often falls asleep on the couch without CPAP. [place]traveled for several days last month). Knows she needs to make better sleep habits. Feels better when using CPAP more rested, restful sleep. For titration study used full face mask, but when fitted at DME she seems to be using oral mask that comes up to the nose,  but doesn't cover. 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  09/22/21 Dr. DBrett Fairy LRoderic OvensDDeaisa Hosakais a 47y.o. African American female patient seen here as a referral on 09/22/2021 from Dr MZigmund Daniel for a sleep medicine consultation..  Chief concern according to patient :"  I wake up with a parched mouth, snore loudly and my kids have complaint. I am tired tired and sleepy all day , even after waking up in the morning. I can fall asleep anytime , anywhere. I can't finish a movie, I am off" - she proceeded to show my a video: Loud snoring. Witnessed apneas- "I gained weight since Covid and during Covid and that may have contributed, at least 15 pounds ". She just quit smoking!    LCourage Mowerhas a past medical history of Abnormal uterine bleeding, Allergy, Chest pain of uncertain etiology (0123XX123, Enlarged thyroid, Fibroids, Prediabetes, Recurrent vaginitis (01/23/2019), Seasonal allergies, Snores, and Tobacco abuse.      Sleep relevant medical history: Nocturia every 1-2 hours of sleep( 5-6-7 times ) , Thyroid is enlarged. deviated septum repair? UPPP?    Family medical /sleep history: night terrors in one son, grandson with enuresis, sleep walking,  no other family member on CPAP with OSA, suspects her brother to have it.  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  her partner is  only every 2 weeks at home. Mother of  3 children, .  The patient currently works daytime . Tobacco use- just quit .   ETOH use ; 1-2 glasses a week,  Caffeine intake in form of Coffee( in winter ) Soda( /) Tea ( lunch , dinner ) no energy drinks. Regular exercise- none .  Swimming in summer.  Hobbies : none      Sleep habits are as follows: The patient's dinner time is between 5-7 PM. The patient goes to bed at 10-11 PM and continues to sleep for short intervals of 1-2 hours, wakes for 5 plus  bathroom  breaks, the first time at 12 AM. Bedroom is cool, quiet and dark.   The preferred sleep position is laterally, with the support of 1-2 pillows. She often feels hot , sweat, wakes up- Dreams are reportedly frequent/vivid. She reports body aches and stiffness.  5  AM is the usual rise time. The patient wakes up at 4.30 with an alarm, which she snoozes. .  She reports not feeling refreshed or restored in AM, with symptoms such as dry mouth(!), morning headaches(!), and residual fatigue.  Naps are taken infrequently, usually on WE, lasting from 20 to 120 minutes and are more refreshing.    REVIEW OF SYSTEMS: Out of a complete 14 system review of symptoms, the patient complains only of the following symptoms, and all other reviewed systems are negative.  See HPI  ALLERGIES: No Known Allergies  HOME MEDICATIONS: Outpatient Medications Prior to Visit  Medication Sig Dispense Refill   acetaminophen (TYLENOL) 500 MG tablet Take 500 mg by mouth daily as needed (pain).     diphenhydrAMINE (BENADRYL) 25 MG tablet Take 25 mg by mouth daily as needed for itching.     Probiotic Product (PROBIOTIC DAILY PO) Take by mouth.     UNABLE TO FIND daily. Med Name: BEETS GUMMIES     Vitamin D, Ergocalciferol, (DRISDOL) 1.25 MG (50000 UNIT) CAPS capsule Take 1 capsule (50,000 Units total) by mouth every 7 (seven) days. 5 capsule 0   benzonatate (TESSALON PERLES) 100 MG capsule Take 1 capsule (100 mg total) by mouth 3 (three) times daily as needed for cough. 20 capsule 0   fluconazole (DIFLUCAN) 150 MG tablet Take 1 tablet (150 mg total) by mouth every 3 (three) days. For three doses 3 tablet 3   fluticasone (FLONASE) 50 MCG/ACT nasal spray Place into both nostrils as needed for allergies or rhinitis.     levocetirizine (XYZAL) 5 MG tablet Take 1 tablet (5 mg total) by mouth every evening. 30 tablet 5   nystatin (MYCOSTATIN) 100000 UNIT/ML suspension Take 5 mLs (500,000 Units total) by mouth 4 (four) times daily.  200 mL 2   ondansetron (ZOFRAN-ODT) 4 MG disintegrating tablet Take 1 tablet (4 mg total) by mouth every 8 (eight) hours as needed for nausea or vomiting. 20 tablet 0   No facility-administered medications prior to visit.    PAST MEDICAL HISTORY: Past Medical History:  Diagnosis Date   Abnormal uterine bleeding    Allergy    Anxiety and depression 11/15/2019   Back pain    Bacterial vaginosis    Chest pain of uncertain etiology 123XX123   Constipation    Enlarged thyroid    Fibroids    Joint pain    OSA (obstructive sleep apnea) 02/14/2022   Pre-diabetes    Prediabetes    Recurrent vaginitis 01/23/2019   Seasonal allergies    Sleep apnea  Snores    Tobacco abuse     PAST SURGICAL HISTORY: Past Surgical History:  Procedure Laterality Date   CESAREAN SECTION     X3   DILATATION & CURETTAGE/HYSTEROSCOPY WITH MYOSURE N/A 05/22/2019   Procedure: DILATATION & CURETTAGE/HYSTEROSCOPY WITH MYOSURE;  Surgeon: Osborne Oman, MD;  Location: [facility];  Service: Gynecology;  Laterality: N/A;   HYSTEROSCOPY N/A 05/22/2019   Procedure: Hysteroscopy with Hydrothermal Ablation ;  Surgeon: Osborne Oman, MD;  Location: [facility];  Service: Gynecology;  Laterality: N/A;   TUBAL LIGATION      FAMILY HISTORY: Family History  Problem Relation Age of Onset   Colon polyps Mother    Hypertension Mother    Depression Mother    Diabetes Father    Hypertension Father    Heart disease Father    Sudden death Father    Cancer Father    Diabetes Sister    Depression Paternal Grandmother    Allergic rhinitis Neg Hx    Angioedema Neg Hx    Asthma Neg Hx    Eczema Neg Hx    Immunodeficiency Neg Hx    Urticaria Neg Hx    Colon cancer Neg Hx    Esophageal cancer Neg Hx    Rectal cancer Neg Hx    Stomach cancer Neg Hx     SOCIAL HISTORY: Social History   Socioeconomic History   Marital status: Married    Spouse name: Not on file   Number  of children: Not on file   Years of education: Not on file   Highest education level: Not on file  Occupational History   Not on file  Tobacco Use   Smoking status: Some Days    Packs/day: 0.25    Types: Cigarettes   Smokeless tobacco: Never  Substance and Sexual Activity   Alcohol use: Yes    Comment: occ   Drug use: Yes    Types: Marijuana    Comment: last used 2 weeks ago   Sexual activity: Not on file  Other Topics Concern   Not on file  Social History Narrative   Not on file   Social Determinants of Health   Financial Resource Strain: Not on file  Food Insecurity: Not on file  Transportation Needs: Not on file  Physical Activity: Not on file  Stress: Not on file  Social Connections: Not on file  Intimate Partner Violence: Not on file   PHYSICAL EXAM  Vitals:   03/29/22 0800  BP: 122/77  Pulse: 64  Weight: 219 lb (99.3 kg)  Height: 5' 4"$  (1.626 m)   Body mass index is 37.59 kg/m.  Generalized: Well developed, in no acute distress  Neurological examination  Mentation: Alert oriented to time, place, history taking. Follows all commands speech and language fluent Cranial nerve II-XII: Pupils were equal round reactive to light. Extraocular movements were full, visual field were full on confrontational test. Facial sensation and strength were normal. Head turning and shoulder shrug were normal and symmetric. Motor: The motor testing reveals 5 over 5 strength of all 4 extremities. Good symmetric motor tone is noted throughout.  Sensory: Sensory testing is intact to soft touch on all 4 extremities. No evidence of extinction is noted.  Coordination: Cerebellar testing reveals good finger-nose-finger and heel-to-shin bilaterally.  Gait and station: Gait is normal.   DIAGNOSTIC DATA (LABS, IMAGING, TESTING) - I reviewed patient records, labs, notes, testing and imaging myself where available.  Lab  Results  Component Value Date   WBC 14.2 (H) 11/08/2021   HGB 13.6  11/08/2021   HCT 41.8 11/08/2021   MCV 88.6 11/08/2021   PLT 226 11/08/2021      Component Value Date/Time   NA 140 02/28/2022 1019   K 5.0 02/28/2022 1019   CL 103 02/28/2022 1019   CO2 25 02/28/2022 1019   GLUCOSE 93 02/28/2022 1019   GLUCOSE 109 (H) 11/08/2021 1404   BUN 12 02/28/2022 1019   CREATININE 0.66 02/28/2022 1019   CALCIUM 9.2 02/28/2022 1019   PROT 6.7 02/28/2022 1019   ALBUMIN 4.3 02/28/2022 1019   AST 21 02/28/2022 1019   ALT 29 02/28/2022 1019   ALKPHOS 100 02/28/2022 1019   BILITOT 0.3 02/28/2022 1019   GFRNONAA >60 11/08/2021 1404   GFRAA >60 07/28/2019 1523   Lab Results  Component Value Date   CHOL 155 02/28/2022   HDL 36 (L) 02/28/2022   LDLCALC 93 02/28/2022   TRIG 149 02/28/2022   CHOLHDL 4.5 (H) 04/02/2021   Lab Results  Component Value Date   HGBA1C 6.1 (H) 02/28/2022   No results found for: "VITAMINB12" Lab Results  Component Value Date   TSH 0.685 02/28/2022    [place], AGNP-C, DNP 03/29/2022, 8:38 AM [facility] [address], [place], [place] [address] [phone]

## 2022-04-06 DIAGNOSIS — G4733 Obstructive sleep apnea (adult) (pediatric): Secondary | ICD-10-CM | POA: Diagnosis not present

## 2022-04-07 ENCOUNTER — Other Ambulatory Visit: Payer: Self-pay | Admitting: Bariatrics

## 2022-04-07 DIAGNOSIS — E559 Vitamin D deficiency, unspecified: Secondary | ICD-10-CM

## 2022-04-09 NOTE — Progress Notes (Unsigned)
Chief Complaint:   OBESITY Chelsea Henson is here to discuss her progress with her obesity treatment plan along with follow-up of her obesity related diagnoses. Chelsea Henson is on the Category 2 Plan + 100 calories and states she is following her eating plan approximately 80% of the time. Chelsea Henson states she is doing cardio and weights 60+ minutes 3-4 times per week.  Today's visit was #: 3 Starting weight: 230 lbs Starting date: 02/28/2022 Today's weight: 219 lbs Today's date: 03/28/2022 Total lbs lost to date: 11 Total lbs lost since last in-office visit: 3  Interim History: Chelsea Henson is own 3 lbs since her last visit. She is not eating at night.  Subjective:   1. Low HDL (under 40) Chelsea Henson is not on medication.  2. Prediabetes Chelsea Henson is not on anti-diabetic medication.   Assessment/Plan:   1. Low HDL (under 40) Continue exercising. Decrease carbohydrates.  2. Prediabetes Work on weight loss, exercise, and reducing carbohydrates.  3. Generalized obesity 4. BMI 36.0-36.9,adult Meal planning Intentional eating Dining Out Guide provided to pt.  Chelsea Henson is currently in the action stage of change. As such, her goal is to continue with weight loss efforts. She has agreed to the Category 2 Plan and practicing portion control and making smarter food choices, such as increasing vegetables and decreasing simple carbohydrates + 100 calories.  Exercise goals:  Continue going to the gym.  Behavioral modification strategies: increasing lean protein intake, decreasing simple carbohydrates, increasing vegetables, increasing water intake, decreasing eating out, no skipping meals, meal planning and cooking strategies, keeping healthy foods in the home, and planning for success.  Chelsea Henson has agreed to follow-up with our clinic in 2 weeks with NP [facility]. She was informed of the importance of frequent follow-up visits to maximize her success with intensive lifestyle modifications for her multiple  health conditions.   Objective:   Blood pressure 129/87, pulse (!) 54, temperature 98.1 F (36.7 C), height '5\' 5"'$  (1.651 m), weight 219 lb (99.3 kg), last menstrual period 03/29/2019, SpO2 96 %. Body mass index is 36.44 kg/m.  General: Cooperative, alert, well developed, in no acute distress. HEENT: Conjunctivae and lids unremarkable. Cardiovascular: Regular rhythm.  Lungs: Normal work of breathing. Neurologic: No focal deficits.   Lab Results  Component Value Date   CREATININE 0.66 02/28/2022   BUN 12 02/28/2022   NA 140 02/28/2022   K 5.0 02/28/2022   CL 103 02/28/2022   CO2 25 02/28/2022   Lab Results  Component Value Date   ALT 29 02/28/2022   AST 21 02/28/2022   ALKPHOS 100 02/28/2022   BILITOT 0.3 02/28/2022   Lab Results  Component Value Date   HGBA1C 6.1 (H) 02/28/2022   HGBA1C 6.1 04/02/2021   HGBA1C 6.0 (A) 02/21/2020   HGBA1C 5.9 (A) 05/29/2019   HGBA1C 5.8 (A) 09/17/2018   Lab Results  Component Value Date   INSULIN 19.9 02/28/2022   Lab Results  Component Value Date   TSH 0.685 02/28/2022   Lab Results  Component Value Date   CHOL 155 02/28/2022   HDL 36 (L) 02/28/2022   LDLCALC 93 02/28/2022   TRIG 149 02/28/2022   CHOLHDL 4.5 (H) 04/02/2021   Lab Results  Component Value Date   VD25OH 15.8 (L) 02/28/2022   Lab Results  Component Value Date   WBC 14.2 (H) 11/08/2021   HGB 13.6 11/08/2021   HCT 41.8 11/08/2021   MCV 88.6 11/08/2021   PLT 226 11/08/2021   Lab Results  Component Value Date   IRON 42 09/22/2021   TIBC 360 09/22/2021   FERRITIN 60 09/22/2021   Attestation Statements:   Reviewed by clinician on day of visit: allergies, medications, problem list, medical history, surgical history, family history, social history, and previous encounter notes.  I, Kathlene November, BS, CMA, am acting as transcriptionist for [employer], DO.  I have reviewed the above documentation for accuracy and completeness, and I agree with the  above. Jearld Lesch, DO

## 2022-04-11 ENCOUNTER — Encounter: Payer: Self-pay | Admitting: Bariatrics

## 2022-04-11 ENCOUNTER — Encounter: Payer: Self-pay | Admitting: Nurse Practitioner

## 2022-04-11 ENCOUNTER — Ambulatory Visit (INDEPENDENT_AMBULATORY_CARE_PROVIDER_SITE_OTHER): Payer: Medicaid Other | Admitting: Nurse Practitioner

## 2022-04-11 VITALS — BP 115/76 | HR 65 | Temp 98.1°F | Ht 64.0 in | Wt 222.0 lb

## 2022-04-11 DIAGNOSIS — E669 Obesity, unspecified: Secondary | ICD-10-CM

## 2022-04-11 DIAGNOSIS — Z6837 Body mass index (BMI) 37.0-37.9, adult: Secondary | ICD-10-CM | POA: Diagnosis not present

## 2022-04-11 DIAGNOSIS — E559 Vitamin D deficiency, unspecified: Secondary | ICD-10-CM

## 2022-04-11 DIAGNOSIS — G4733 Obstructive sleep apnea (adult) (pediatric): Secondary | ICD-10-CM

## 2022-04-11 DIAGNOSIS — R7303 Prediabetes: Secondary | ICD-10-CM

## 2022-04-11 NOTE — Progress Notes (Signed)
Office: [phone]  /  Fax: [phone]  WEIGHT SUMMARY AND BIOMETRICS  Weight Lost Since Last Visit: 0lb  No data recorded  Vitals Temp: 98.1 F (36.7 C) BP: 115/76 Pulse Rate: 65 SpO2: 99 %   Anthropometric Measurements Height: '5\' 4"'$  (1.626 m) Weight: 222 lb (100.7 kg) BMI (Calculated): 38.09 Weight at Last Visit: 219lb Weight Lost Since Last Visit: 0lb Starting Weight: 230lb Total Weight Loss (lbs): 5 lb (2.268 kg)   Body Composition  Body Fat %: 44 % Fat Mass (lbs): 98 lbs Muscle Mass (lbs): 118.4 lbs Total Body Water (lbs): 87 lbs Visceral Fat Rating : 12   Other Clinical Data Today's Visit #: 4 Starting Date: 02/28/22     HPI  Chief Complaint: OBESITY  Chelsea Henson is here to discuss her progress with her obesity treatment plan. She is on the keeping a food journal and adhering to recommended goals of 1200-1300 calories and 80-90 protein and states she is following her eating plan approximately 0 % of the time. She states she is exercising 90-180 minutes 3-4 days per week.   Interval History:  Since last office visit she has gained 3 lbs.  Has been working out more since her last visit.  She loves it and feels like "I'm becoming a gym rat".  She is doing cardio, core training and resistance training.  Aiming to eat protein but "not enough as I should and I need to increase water."   Pharmacotherapy for weight loss: She is not currently taking medications  for medical weight loss.   Previous pharmacotherapy for medical weight loss:   Never  Bariatric surgery:  Never had bariatric surgery   Obstructive Sleep Apnea Chelsea Henson has a diagnosis of sleep apnea. She reports that she is not using a CPAP regularly.  Report snoring and wakes herself up when not using CPAP.  Does wake up feeling refreshed when using CPAP on a regular basis.    Prediabetes Last A1c was 6.1  Medication(s): Not currently on meds.  Took Metformin in the past.  Had side effects of  nausea.  FH: mother and father.   Lab Results  Component Value Date   HGBA1C 6.1 (H) 02/28/2022   HGBA1C 6.1 04/02/2021   HGBA1C 6.0 (A) 02/21/2020   HGBA1C 5.9 (A) 05/29/2019   HGBA1C 5.8 (A) 09/17/2018   Lab Results  Component Value Date   INSULIN 19.9 02/28/2022    Vit D deficiency  She is taking Vit D 50,000 IU weekly.  Denies side effects.  Denies nausea, vomiting or muscle weakness.    Lab Results  Component Value Date   VD25OH 15.8 (L) 02/28/2022    PHYSICAL EXAM:  Blood pressure 115/76, pulse 65, temperature 98.1 F (36.7 C), height '5\' 4"'$  (1.626 m), weight 222 lb (100.7 kg), last menstrual period 03/29/2019, SpO2 99 %. Body mass index is 38.11 kg/m.  General: She is overweight, cooperative, alert, well developed, and in no acute distress. PSYCH: Has normal mood, affect and thought process.   Extremities: No edema.  Neurologic: No gross sensory or motor deficits. No tremors or fasciculations noted.    DIAGNOSTIC DATA REVIEWED:  BMET    Component Value Date/Time   NA 140 02/28/2022 1019   K 5.0 02/28/2022 1019   CL 103 02/28/2022 1019   CO2 25 02/28/2022 1019   GLUCOSE 93 02/28/2022 1019   GLUCOSE 109 (H) 11/08/2021 1404   BUN 12 02/28/2022 1019   CREATININE 0.66 02/28/2022 1019   CALCIUM  9.2 02/28/2022 1019   GFRNONAA >60 11/08/2021 1404   GFRAA >60 07/28/2019 1523   Lab Results  Component Value Date   HGBA1C 6.1 (H) 02/28/2022   HGBA1C 5.8 (A) 09/17/2018   Lab Results  Component Value Date   INSULIN 19.9 02/28/2022   Lab Results  Component Value Date   TSH 0.685 02/28/2022   CBC    Component Value Date/Time   WBC 14.2 (H) 11/08/2021 1404   RBC 4.72 11/08/2021 1404   HGB 13.6 11/08/2021 1404   HGB 13.6 04/03/2020 1012   HCT 41.8 11/08/2021 1404   HCT 42.6 04/03/2020 1012   PLT 226 11/08/2021 1404   PLT 289 04/03/2020 1012   MCV 88.6 11/08/2021 1404   MCV 86 04/03/2020 1012   MCH 28.8 11/08/2021 1404   MCHC 32.5 11/08/2021 1404    RDW 14.0 11/08/2021 1404   RDW 13.4 04/03/2020 1012   Iron Studies    Component Value Date/Time   IRON 42 09/22/2021 1614   TIBC 360 09/22/2021 1614   FERRITIN 60 09/22/2021 1614   IRONPCTSAT 12 (L) 09/22/2021 1614   Lipid Panel     Component Value Date/Time   CHOL 155 02/28/2022 1019   TRIG 149 02/28/2022 1019   HDL 36 (L) 02/28/2022 1019   CHOLHDL 4.5 (H) 04/02/2021 0947   LDLCALC 93 02/28/2022 1019   Hepatic Function Panel     Component Value Date/Time   PROT 6.7 02/28/2022 1019   ALBUMIN 4.3 02/28/2022 1019   AST 21 02/28/2022 1019   ALT 29 02/28/2022 1019   ALKPHOS 100 02/28/2022 1019   BILITOT 0.3 02/28/2022 1019      Component Value Date/Time   TSH 0.685 02/28/2022 1019   Nutritional Lab Results  Component Value Date   VD25OH 15.8 (L) 02/28/2022     ASSESSMENT AND PLAN  TREATMENT PLAN FOR OBESITY:  Recommended Dietary Goals  Chelsea Henson is currently in the action stage of change. As such, her goal is to continue weight management plan. She has agreed to keeping a food journal and adhering to recommended goals of 1300 calories and 100+ protein.  Behavioral Intervention  We discussed the following Behavioral Modification Strategies today: increasing lean protein intake, decreasing simple carbohydrates , increasing vegetables, avoiding skipping meals, increasing water intake, and work on meal planning and easy cooking plans.  Additional resources provided today: NA  Recommended Physical Activity Goals  Chelsea Henson has been advised to work up to 150 minutes of moderate intensity aerobic activity a week and strengthening exercises 2-3 times per week for cardiovascular health, weight loss maintenance and preservation of muscle mass.   She has agreed to continue physical activity as is.    ASSOCIATED CONDITIONS ADDRESSED TODAY  Action/Plan  OSA (obstructive sleep apnea) Continue to follow up with neurology.  Continue/encouraged to use CPAP nightly.     Prediabetes Chelsea Henson will continue to work on weight loss, exercise, and decreasing simple carbohydrates to help decrease the risk of diabetes.    Vitamin D deficiency Continue Vit D as directed  Low Vitamin D level contributes to fatigue and are associated with obesity, breast, and colon cancer. She agrees to continue to take prescription Vitamin D '@50'$ ,000 IU every week and will follow-up for routine testing of Vitamin D, at least 2-3 times per year to avoid over-replacement.   Recommended a DEXA due to Vit D def and menopausal.  No history of fracture.  To discuss with PCP.    Generalized obesity  BMI  37.0-37.9, adult         Return in about 2 weeks (around 04/25/2022).Marland Kitchen She was informed of the importance of frequent follow up visits to maximize her success with intensive lifestyle modifications for her multiple health conditions.   ATTESTASTION STATEMENTS:  Reviewed by clinician on day of visit: allergies, medications, problem list, medical history, surgical history, family history, social history, and previous encounter notes.   Time spent on visit including pre-visit chart review and post-visit care and charting was 30 minutes.    Ailene Rud. Nafeesa Dils FNP-C

## 2022-04-11 NOTE — Patient Instructions (Addendum)

## 2022-04-22 ENCOUNTER — Ambulatory Visit (INDEPENDENT_AMBULATORY_CARE_PROVIDER_SITE_OTHER): Payer: Medicaid Other

## 2022-04-22 ENCOUNTER — Other Ambulatory Visit (HOSPITAL_COMMUNITY)
Admission: RE | Admit: 2022-04-22 | Discharge: 2022-04-22 | Disposition: A | Discharge status: Home / Self Care | Payer: Medicaid Other | Source: Ambulatory Visit | Attending: Family Medicine | Admitting: Family Medicine

## 2022-04-22 VITALS — BP 120/78 | HR 67 | Wt 226.0 lb

## 2022-04-22 DIAGNOSIS — N898 Other specified noninflammatory disorders of vagina: Secondary | ICD-10-CM | POA: Diagnosis not present

## 2022-04-22 NOTE — Progress Notes (Signed)
SUBJECTIVE:  47 y.o. female who desires a STI screen.  No UTI symptoms. Denies history of known exposure to STD. Does note some odor   LMP: N/A   OBJECTIVE:  She appears well.   ASSESSMENT:  STI Screen   PLAN:  Pt offered STI blood screening-not indicated GC, chlamydia  probe sent to lab.  Treatment: To be determined once lab results are received.  Pt follow up as needed.

## 2022-04-25 ENCOUNTER — Encounter: Payer: Self-pay | Admitting: Nurse Practitioner

## 2022-04-25 ENCOUNTER — Ambulatory Visit (INDEPENDENT_AMBULATORY_CARE_PROVIDER_SITE_OTHER): Payer: Medicaid Other | Admitting: Nurse Practitioner

## 2022-04-25 VITALS — BP 128/80 | HR 67 | Temp 98.0°F | Ht 64.0 in | Wt 218.0 lb

## 2022-04-25 DIAGNOSIS — R7303 Prediabetes: Secondary | ICD-10-CM | POA: Diagnosis not present

## 2022-04-25 DIAGNOSIS — Z6837 Body mass index (BMI) 37.0-37.9, adult: Secondary | ICD-10-CM

## 2022-04-25 DIAGNOSIS — E669 Obesity, unspecified: Secondary | ICD-10-CM | POA: Diagnosis not present

## 2022-04-25 LAB — CERVICOVAGINAL ANCILLARY ONLY
Bacterial Vaginitis (gardnerella): NEGATIVE
Candida Glabrata: NEGATIVE
Candida Vaginitis: NEGATIVE
Chlamydia: NEGATIVE
Comment: NEGATIVE
Comment: NEGATIVE
Comment: NEGATIVE
Comment: NEGATIVE
Comment: NORMAL
Neisseria Gonorrhea: NEGATIVE

## 2022-04-25 NOTE — Progress Notes (Signed)
Office: [phone]  /  Fax: [phone]  WEIGHT SUMMARY AND BIOMETRICS  Weight Lost Since Last Visit: 4lb  No data recorded  Vitals Temp: 98 F (36.7 C) BP: 128/80 Pulse Rate: 67 SpO2: 99 %   Anthropometric Measurements Height: 5\' 4"  (1.626 m) Weight: 218 lb (98.9 kg) BMI (Calculated): 37.4 Weight at Last Visit: 222lb Weight Lost Since Last Visit: 4lb Starting Weight: 230lb Total Weight Loss (lbs): 4 lb (1.814 kg) Peak Weight: 12lb   Body Composition  Body Fat %: 44.5 % Fat Mass (lbs): 97 lbs Muscle Mass (lbs): 114.8 lbs Total Body Water (lbs): 84.6 lbs Visceral Fat Rating : 12   Other Clinical Data Today's Visit #: 5 Starting Date: 02/28/22     HPI  Chief Complaint: OBESITY  Chelsea Henson is here to discuss her progress with her obesity treatment plan. She is on the keeping a food journal and adhering to recommended goals of 1300 calories and 100+ protein and states she is following her eating plan approximately 0 % of the time. She states she is exercising 60-120 minutes 3-4 days per week.   Interval History:  Since last office visit she has lost 4 pounds. She has overall done well with weight loss.  She has been out of town for 2 weeks since her last visit.  She is not currently weighing or measuring her food due to traveling.  She is making healthier choices and watching her portion sizes.  She does not typically eat breakfast and has started eating yogurt for breakfast recently. She eats at [employer] for lunch-turkey wrap.  Switched from bread to wrap last week.  Goal weight:  180 lbs. She stopped smoking since her last visit.    Pharmacotherapy for weight loss: She is not currently taking medications  for medical weight loss.    Previous pharmacotherapy for medical weight loss:  Never took weight loss medications.   Bariatric surgery:  Patient never had bariatric surgery.   Prediabetes Last A1c was 6.1  Medication(s): Not currently on meds.  Took  Metformin in the past.  Had side effects of nausea.   FH: mother and father.   PHYSICAL EXAM:  Blood pressure 128/80, pulse 67, temperature 98 F (36.7 C), height 5\' 4"  (1.626 m), weight 218 lb (98.9 kg), last menstrual period 03/29/2019, SpO2 99 %. Body mass index is 37.42 kg/m.  General: She is overweight, cooperative, alert, well developed, and in no acute distress. PSYCH: Has normal mood, affect and thought process.   Extremities: No edema.  Neurologic: No gross sensory or motor deficits. No tremors or fasciculations noted.    DIAGNOSTIC DATA REVIEWED:  BMET    Component Value Date/Time   NA 140 02/28/2022 1019   K 5.0 02/28/2022 1019   CL 103 02/28/2022 1019   CO2 25 02/28/2022 1019   GLUCOSE 93 02/28/2022 1019   GLUCOSE 109 (H) 11/08/2021 1404   BUN 12 02/28/2022 1019   CREATININE 0.66 02/28/2022 1019   CALCIUM 9.2 02/28/2022 1019   GFRNONAA >60 11/08/2021 1404   GFRAA >60 07/28/2019 1523   Lab Results  Component Value Date   HGBA1C 6.1 (H) 02/28/2022   HGBA1C 5.8 (A) 09/17/2018   Lab Results  Component Value Date   INSULIN 19.9 02/28/2022   Lab Results  Component Value Date   TSH 0.685 02/28/2022   CBC    Component Value Date/Time   WBC 14.2 (H) 11/08/2021 1404   RBC 4.72 11/08/2021 1404   HGB 13.6 11/08/2021  1404   HGB 13.6 04/03/2020 1012   HCT 41.8 11/08/2021 1404   HCT 42.6 04/03/2020 1012   PLT 226 11/08/2021 1404   PLT 289 04/03/2020 1012   MCV 88.6 11/08/2021 1404   MCV 86 04/03/2020 1012   MCH 28.8 11/08/2021 1404   MCHC 32.5 11/08/2021 1404   RDW 14.0 11/08/2021 1404   RDW 13.4 04/03/2020 1012   Iron Studies    Component Value Date/Time   IRON 42 09/22/2021 1614   TIBC 360 09/22/2021 1614   FERRITIN 60 09/22/2021 1614   IRONPCTSAT 12 (L) 09/22/2021 1614   Lipid Panel     Component Value Date/Time   CHOL 155 02/28/2022 1019   TRIG 149 02/28/2022 1019   HDL 36 (L) 02/28/2022 1019   CHOLHDL 4.5 (H) 04/02/2021 0947   LDLCALC 93  02/28/2022 1019   Hepatic Function Panel     Component Value Date/Time   PROT 6.7 02/28/2022 1019   ALBUMIN 4.3 02/28/2022 1019   AST 21 02/28/2022 1019   ALT 29 02/28/2022 1019   ALKPHOS 100 02/28/2022 1019   BILITOT 0.3 02/28/2022 1019      Component Value Date/Time   TSH 0.685 02/28/2022 1019   Nutritional Lab Results  Component Value Date   VD25OH 15.8 (L) 02/28/2022     ASSESSMENT AND PLAN  TREATMENT PLAN FOR OBESITY:  Recommended Dietary Goals  Chelsea Henson is currently in the action stage of change. As such, her goal is to continue weight management plan. She has agreed to keeping a food journal and adhering to recommended goals of 1300 calories and 100+ protein.  Behavioral Intervention  We discussed the following Behavioral Modification Strategies today: increasing lean protein intake, decreasing simple carbohydrates , increasing vegetables, avoiding skipping meals, increasing water intake, and work on meal planning and easy cooking plans.  Additional resources provided today: NA  Recommended Physical Activity Goals  Chelsea Henson has been advised to work up to 150 minutes of moderate intensity aerobic activity a week and strengthening exercises 2-3 times per week for cardiovascular health, weight loss maintenance and preservation of muscle mass.   She has agreed to Continue current level of physical activity    ASSOCIATED CONDITIONS ADDRESSED TODAY  Action/Plan  Prediabetes Chelsea Henson will continue to work on weight loss, exercise, and decreasing simple carbohydrates to help decrease the risk of diabetes.    Generalized obesity  BMI 37.0-37.9, adult     Return in about 4 weeks (around 05/23/2022).Marland Kitchen She was informed of the importance of frequent follow up visits to maximize her success with intensive lifestyle modifications for her multiple health conditions.   ATTESTASTION STATEMENTS:  Reviewed by clinician on day of visit: allergies, medications, problem  list, medical history, surgical history, family history, social history, and previous encounter notes.   Time spent on visit including pre-visit chart review and post-visit care and charting was 30 minutes.    Ailene Rud. Romond Pipkins FNP-C

## 2022-05-03 ENCOUNTER — Encounter: Payer: Self-pay | Admitting: Neurology

## 2022-05-05 DIAGNOSIS — G4733 Obstructive sleep apnea (adult) (pediatric): Secondary | ICD-10-CM | POA: Diagnosis not present

## 2022-05-09 ENCOUNTER — Encounter: Payer: Self-pay | Admitting: Obstetrics & Gynecology

## 2022-05-09 ENCOUNTER — Other Ambulatory Visit (HOSPITAL_COMMUNITY)
Admission: RE | Admit: 2022-05-09 | Discharge: 2022-05-09 | Disposition: A | Discharge status: Home / Self Care | Payer: Medicaid Other | Source: Ambulatory Visit | Attending: Obstetrics & Gynecology | Admitting: Obstetrics & Gynecology

## 2022-05-09 ENCOUNTER — Ambulatory Visit (INDEPENDENT_AMBULATORY_CARE_PROVIDER_SITE_OTHER): Payer: Medicaid Other | Admitting: Obstetrics & Gynecology

## 2022-05-09 VITALS — BP 130/86 | HR 58 | Ht 65.0 in | Wt 222.0 lb

## 2022-05-09 DIAGNOSIS — Z01419 Encounter for gynecological examination (general) (routine) without abnormal findings: Secondary | ICD-10-CM

## 2022-05-09 DIAGNOSIS — N951 Menopausal and female climacteric states: Secondary | ICD-10-CM | POA: Diagnosis not present

## 2022-05-09 MED ORDER — PREMARIN 0.625 MG/GM VA CREA: 1.0000 | TOPICAL_CREAM | Freq: Every day | VAGINAL | 12 refills | Status: AC

## 2022-05-09 NOTE — Patient Instructions (Addendum)
Hyaluronic acid vaginal moisturizers  [facility]  [place]  [email or web address]

## 2022-05-09 NOTE — Progress Notes (Signed)
GYNECOLOGY ANNUAL PREVENTATIVE CARE ENCOUNTER NOTE  History:     Chelsea Henson is a 47 y.o. G3P3 female here for a routine annual gynecologic exam.  Current complaints: vaginal dryness which is bothersome. Also occasional hot flashes and night sweats.   Denies abnormal vaginal bleeding, discharge, pelvic pain, problems with intercourse or other gynecologic concerns.    Gynecologic History Patient's last menstrual period was 03/29/2019. Last Pap: 02/21/2019. Result was normal with negative HPV Last Mammogram: 12/13/2021.  Result was normal Last Colonoscopy: 10/08/2021.  Result was normal  Obstetric History OB History  Gravida Para Term Preterm AB Living  3            SAB IAB Ectopic Multiple Live Births               # Outcome Date GA Lbr Len/2nd Weight Sex Delivery Anes PTL Lv  3 Gravida           2 Gravida           1 [place]             Past Medical History:  Diagnosis Date   Abnormal uterine bleeding    Allergy    Anxiety and depression 11/15/2019   Back pain    Bacterial vaginosis    Chest pain of uncertain etiology [id number]   Constipation    Enlarged thyroid    Fibroids    Joint pain    OSA (obstructive sleep apnea) 02/14/2022   Pre-diabetes    Prediabetes    Recurrent vaginitis 01/23/2019   Seasonal allergies    Sleep apnea    Snores    Tobacco abuse     Past Surgical History:  Procedure Laterality Date   CESAREAN SECTION     X3   DILATATION & CURETTAGE/HYSTEROSCOPY WITH MYOSURE N/A 05/22/2019   Procedure: DILATATION & CURETTAGE/HYSTEROSCOPY WITH MYOSURE;  Surgeon: Osborne Oman, MD;  Location: [place];  Service: Gynecology;  Laterality: N/A;   HYSTEROSCOPY N/A 05/22/2019   Procedure: Hysteroscopy with Hydrothermal Ablation ;  Surgeon: Osborne Oman, MD;  Location: [place];  Service: Gynecology;  Laterality: N/A;   TUBAL LIGATION      Current Outpatient Medications on File Prior to Visit  Medication  Sig Dispense Refill   Probiotic Product (PROBIOTIC DAILY PO) Take by mouth.     Vitamin D, Ergocalciferol, (DRISDOL) 1.25 MG (50000 UNIT) CAPS capsule TAKE 1 CAPSULE (50,000 UNITS TOTAL) BY MOUTH EVERY 7 (SEVEN) DAYS 12 capsule 1   acetaminophen (TYLENOL) 500 MG tablet Take 500 mg by mouth daily as needed (pain).     diphenhydrAMINE (BENADRYL) 25 MG tablet Take 25 mg by mouth daily as needed for itching.     No current facility-administered medications on file prior to visit.    No Known Allergies  Social History:  reports that she has been smoking cigarettes. She has been smoking an average of .25 packs per day. She has never used smokeless tobacco. She reports current alcohol use. She reports current drug use. Drug: Marijuana.  Family History  Problem Relation Age of Onset   Colon polyps Mother    Hypertension Mother    Depression Mother    Diabetes Father    Hypertension Father    Heart disease Father    Sudden death Father    Cancer Father    Diabetes Sister    Depression Paternal Grandmother    Allergic rhinitis Neg Hx  Angioedema Neg Hx    Asthma Neg Hx    Eczema Neg Hx    Immunodeficiency Neg Hx    Urticaria Neg Hx    Colon cancer Neg Hx    Esophageal cancer Neg Hx    Rectal cancer Neg Hx    Stomach cancer Neg Hx     The following portions of the patient's history were reviewed and updated as appropriate: allergies, current medications, past family history, past medical history, past social history, past surgical history and problem list.  Review of Systems Pertinent items noted in HPI and remainder of comprehensive ROS otherwise negative.  Physical Exam:  BP 130/86   Pulse (!) 58   Ht 5\' 5"  (1.651 m)   Wt 222 lb (100.7 kg)   LMP 03/29/2019   BMI 36.94 kg/m  CONSTITUTIONAL: Well-developed, well-nourished female in no acute distress.  HENT:  Normocephalic, atraumatic, External right and left ear normal.  EYES: Conjunctivae and EOM are normal. Pupils are  equal, round, and reactive to light. No scleral icterus.  NECK: Normal range of motion, supple, no masses.  Normal thyroid.  SKIN: Skin is warm and dry. No rash noted. Not diaphoretic. No erythema. No pallor. MUSCULOSKELETAL: Normal range of motion. No tenderness.  No cyanosis, clubbing, or edema. NEUROLOGIC: Alert and oriented to person, place, and time. Normal reflexes, muscle tone coordination.  PSYCHIATRIC: Normal mood and affect. Normal behavior. Normal judgment and thought content. CARDIOVASCULAR: Normal heart rate noted, regular rhythm RESPIRATORY: Clear to auscultation bilaterally. Effort and breath sounds normal, no problems with respiration noted. BREASTS: Symmetric in size. No masses, tenderness, skin changes, nipple drainage, or lymphadenopathy bilaterally. Performed in the presence of a chaperone. ABDOMEN: Soft, no distention noted.  No tenderness, rebound or guarding.  PELVIC: Normal appearing external genitalia and urethral meatus; normal appearing vaginal mucosa and cervix.  No abnormal vaginal discharge noted.  Pap smear obtained.  Normal uterine size, no other palpable masses, no uterine or adnexal tenderness.  Performed in the presence of a chaperone.   Assessment and Plan:    1. Perimenopausal vaginal dryness and symptoms Patient with some bothersome menopausal vasomotor symptoms. Discussed lifestyle interventions such as wearing light clothing, remaining in cool environments, having fan/air conditioner in the room, avoiding hot beverages etc.  Discussed using hormone therapy and concerns about increased risk of heart disease, cerebrovascular disease, thromboembolic disease,  and breast cancer.  Also discussed other medical options such as  Effexor, Lexapro or Neurontin.   Also discussed alternative therapies such as herbal remedies but cautioned that most of the products contained phytoestrogens (plant estrogens) in unregulated amounts which can have the same effects on the body as  the pharmaceutical estrogen preparations.  Also referred her to [email or web address] for other alternative options.  Patient opted for vaginal estrogen therapy for now, this was prescribed.  Also discussed use of vaginal hyaluronic acid testing. - conjugated estrogens (PREMARIN) vaginal cream; Place 1 Applicatorful vaginally at bedtime. Apply every night for one month then three times a week  Dispense: 60 g; Refill: 12  2. Well woman exam with routine gynecological exam - Cytology - PAP Will follow up results of pap smear and manage accordingly. Mammogram and colon cancer screening are up to date. Routine preventative health maintenance measures emphasized. Please refer to After Visit Summary for other counseling recommendations.      Verita Schneiders, MD, [place] for [employer], [place]

## 2022-05-10 ENCOUNTER — Ambulatory Visit: Payer: Medicaid Other | Admitting: Obstetrics & Gynecology

## 2022-05-10 LAB — CYTOLOGY - PAP
Comment: NEGATIVE
Diagnosis: NEGATIVE
High risk HPV: NEGATIVE

## 2022-05-11 DIAGNOSIS — H5213 Myopia, bilateral: Secondary | ICD-10-CM | POA: Diagnosis not present

## 2022-05-13 DIAGNOSIS — G4733 Obstructive sleep apnea (adult) (pediatric): Secondary | ICD-10-CM | POA: Diagnosis not present

## 2022-05-23 ENCOUNTER — Encounter: Payer: Self-pay | Admitting: Family Medicine

## 2022-05-23 ENCOUNTER — Ambulatory Visit (INDEPENDENT_AMBULATORY_CARE_PROVIDER_SITE_OTHER): Payer: Medicaid Other | Admitting: Family Medicine

## 2022-05-23 ENCOUNTER — Ambulatory Visit: Payer: Medicaid Other | Admitting: Nurse Practitioner

## 2022-05-23 VITALS — BP 118/71 | HR 69 | Ht 65.0 in | Wt 214.0 lb

## 2022-05-23 DIAGNOSIS — L600 Ingrowing nail: Secondary | ICD-10-CM

## 2022-05-23 NOTE — Patient Instructions (Signed)
It was great seeing you today!  Today we discussed your ingrown toenail, I would recommend soaking it in epsom salt at least 1-2 times a day. It does not seem to require part of the nail removed at this time. If it does not get better in 1 month, then schedule another appointment to see [place]. We can consider a toenail removal, as we discussed, at that time.   Please follow up at your next scheduled appointment in 1 month, if anything arises between now and then, please don't hesitate to contact our office.   Thank you for allowing [place] to be a part of your medical care!  Thank you, Dr. Robyne Peers  Also a reminder of our clinic's no-show policy. Please make sure to arrive at least 15 minutes prior to your scheduled appointment time. Please try to cancel before 24 hours if you are not able to make it. If you no-show for 2 appointments then you will be receiving a warning letter. If you no-show after 3 visits, then you may be at risk of being dismissed from our clinic. This is to ensure that everyone is able to be seen in a timely manner. Thank you, we appreciate your assistance with this!

## 2022-05-23 NOTE — Progress Notes (Signed)
    SUBJECTIVE:   CHIEF COMPLAINT / HPI:   Patient presents with concern of ingrown toe on the left side. Associated injury about 7 years ago when she hit her head against a bed. It took years to heal which is why she did not come in sooner. Now the nail is painful along the big toe.   OBJECTIVE:   BP 118/71   Pulse 69   Ht 5\' 5"  (1.651 m)   Wt 214 lb (97.1 kg)   LMP 03/29/2019   SpO2 100%   BMI 35.61 kg/m   General: Patient well-appearing, in no acute distress. Resp: normal work of breathing noted Ext: no LE edema noted bilaterally, tenderness to palpation of nail bed of big left toe, no erythema, edema or drainage noted Psych: mood appropriate, very pleasant   ASSESSMENT/PLAN:   Ingrown toenail -reassuringly no signs of infectious etiology, likely pain from ingrown toe nail. Other differentials considered include paronychia or abscess but low suspicion for these given physical exam -handout provided and methods discussed including proper way to clip nails and soaking foot in epsom salt -no indication for nail removal at this time -follow up in 1 month, consider referral to derm clinic for nail removal if still not improving      Skylie Hiott Robyne Peers, DO [facility]

## 2022-05-23 NOTE — Assessment & Plan Note (Signed)
-  reassuringly no signs of infectious etiology, likely pain from ingrown toe nail. Other differentials considered include paronychia or abscess but low suspicion for these given physical exam -handout provided and methods discussed including proper way to clip nails and soaking foot in epsom salt -no indication for nail removal at this time -follow up in 1 month, consider referral to derm clinic for nail removal if still not improving

## 2022-06-05 DIAGNOSIS — G4733 Obstructive sleep apnea (adult) (pediatric): Secondary | ICD-10-CM | POA: Diagnosis not present

## 2022-06-13 ENCOUNTER — Ambulatory Visit: Payer: Medicaid Other | Admitting: Nurse Practitioner

## 2022-06-17 ENCOUNTER — Ambulatory Visit: Payer: Medicaid Other | Admitting: Family Medicine

## 2022-07-05 DIAGNOSIS — G4733 Obstructive sleep apnea (adult) (pediatric): Secondary | ICD-10-CM | POA: Diagnosis not present

## 2022-08-05 DIAGNOSIS — G4733 Obstructive sleep apnea (adult) (pediatric): Secondary | ICD-10-CM | POA: Diagnosis not present

## 2022-09-04 DIAGNOSIS — G4733 Obstructive sleep apnea (adult) (pediatric): Secondary | ICD-10-CM | POA: Diagnosis not present

## 2022-09-28 ENCOUNTER — Telehealth: Payer: Medicaid Other | Admitting: Neurology

## 2022-10-05 DIAGNOSIS — G4733 Obstructive sleep apnea (adult) (pediatric): Secondary | ICD-10-CM | POA: Diagnosis not present

## 2022-12-06 ENCOUNTER — Telehealth: Payer: Self-pay

## 2022-12-06 DIAGNOSIS — G4733 Obstructive sleep apnea (adult) (pediatric): Secondary | ICD-10-CM | POA: Diagnosis not present

## 2022-12-06 NOTE — Telephone Encounter (Signed)
Received fax back from [employer], stating appointment is needed. I called [employer] and appointment may be required for patient to get new supplies, but patient did not send a request per the rep mercedes at [employer] [facility]. She states the patient got the machine in November of 2023, a new mask in April and no requests since then. I spoke with Maralyn Sago verbally and pulled the 90 day compliance that states she used her machine 1% out of 90 days. We will wait for patient to contact if she needs [place] and at that point we will schedule a follow up.

## 2022-12-06 NOTE — Telephone Encounter (Signed)
SIGNED DME ORDERS FAXED

## 2023-01-13 ENCOUNTER — Ambulatory Visit (INDEPENDENT_AMBULATORY_CARE_PROVIDER_SITE_OTHER): Payer: Medicaid Other | Admitting: Student

## 2023-01-13 ENCOUNTER — Encounter: Payer: Self-pay | Admitting: Student

## 2023-01-13 VITALS — BP 135/85 | HR 68 | Temp 97.8°F | Ht 65.0 in | Wt 223.4 lb

## 2023-01-13 DIAGNOSIS — R350 Frequency of micturition: Secondary | ICD-10-CM | POA: Diagnosis present

## 2023-01-13 LAB — POCT URINALYSIS DIP (MANUAL ENTRY)
Bilirubin, UA: NEGATIVE
Blood, UA: NEGATIVE
Glucose, UA: NEGATIVE mg/dL
Ketones, POC UA: NEGATIVE mg/dL
Leukocytes, UA: NEGATIVE
Nitrite, UA: NEGATIVE
Protein Ur, POC: NEGATIVE mg/dL
Spec Grav, UA: 1.02 (ref 1.010–1.025)
Urobilinogen, UA: 0.2 U/dL
pH, UA: 6 (ref 5.0–8.0)

## 2023-01-13 NOTE — Patient Instructions (Signed)
Nice to meet you today.  Suspect your increased urinary frequency is most likely due to use of lemon which are water.  As recommended I will advise avoiding use of lemon for a month with your water and less if your symptoms improved.  Urine analysis was obtained today which was negative.  For your chest congestion your oral exam was normal.  See anything on your tonsils or your adenoids.    I strongly advised quitting smoking.  We can always provide you assistance with smoking cessation should he ever be interested.  You can use over-the-counter Mucinex for a week.  Will help with the congestion and phlegm.

## 2023-01-13 NOTE — Progress Notes (Signed)
    SUBJECTIVE:   CHIEF COMPLAINT / HPI: Chest congestion  47 year old female with history of allergic rhinitis, OSA, tobacco use  Presenting today for chest congestion She reports this has been going on for 2 weeks Associated symptoms include cough with non bloody phelm  Endorses using tobacco daily  Friend told her she has tonsillar stones  Patient wanted to be seen today for evaluation for the stones. She denies any chest pains, but endorses mild dyspnea which is chronic.  Polyuria Patient without any history of diabetes but she has had continued polyuria over the past 2 months.  No hematuria or dysuria but endorses increased frequency.  She drinks water daily with lemon and ginger.  No recent change in medications.  PERTINENT  PMH / PSH: Reviewed   OBJECTIVE:   BP 135/85   Pulse 68   Temp 97.8 F (36.6 C)   Ht 5\' 5"  (1.651 m)   Wt 223 lb 6.4 oz (101.3 kg)   SpO2 98%   BMI 37.18 kg/m   Physical Exam General: Alert, well appearing, NAD HEENT: No oral and pharyngeal erythema or tonsillar exudates Cardiovascular: RRR, No Murmurs, Normal S2/S2 Respiratory: CTAB, No wheezing or Rales Abdomen: No distension or tenderness Extremities: No edema on extremities    ASSESSMENT/PLAN:   Chest Congestion Cardiopulmonary exam was unremarkable today.  Patient's constellation of symptoms are not consistent with ACS or infectious process. Suspect her symptoms are likely due to longstanding history of tobacco use.  Emphasized need for smoking cessation and informed patient of various options to facilitate smoking cessation.  Patient verbalized understanding and will follow-up when she is ready to discontinue tobacco use.   Polyuria Suspect patient's polyuria is most likely due to use of lemon in her water given that lemon acts as a diuretic.  UA obtained today was generally unremarkable.  Patient will hold off use of lemon with water for a month to see if there is any improvement of  symptoms.   Jerre Simon, MD [facility]

## 2023-02-24 ENCOUNTER — Other Ambulatory Visit: Payer: Self-pay | Admitting: Student

## 2023-02-24 DIAGNOSIS — Z1231 Encounter for screening mammogram for malignant neoplasm of breast: Secondary | ICD-10-CM

## 2023-03-07 ENCOUNTER — Ambulatory Visit
Admission: RE | Admit: 2023-03-07 | Discharge: 2023-03-07 | Disposition: A | Discharge status: Home / Self Care | Payer: Medicaid Other | Source: Ambulatory Visit | Attending: Internal Medicine | Admitting: Internal Medicine

## 2023-03-07 VITALS — BP 152/97 | HR 78 | Temp 101.3°F | Resp 18

## 2023-03-07 DIAGNOSIS — R509 Fever, unspecified: Secondary | ICD-10-CM | POA: Diagnosis not present

## 2023-03-07 DIAGNOSIS — J101 Influenza due to other identified influenza virus with other respiratory manifestations: Secondary | ICD-10-CM | POA: Diagnosis not present

## 2023-03-07 DIAGNOSIS — F17210 Nicotine dependence, cigarettes, uncomplicated: Secondary | ICD-10-CM

## 2023-03-07 LAB — POC COVID19/FLU A&B COMBO
Covid Antigen, POC: NEGATIVE
Influenza A Antigen, POC: POSITIVE — AB
Influenza B Antigen, POC: NEGATIVE

## 2023-03-07 MED ORDER — ALBUTEROL SULFATE HFA 108 (90 BASE) MCG/ACT IN AERS
2.0000 | INHALATION_SPRAY | Freq: Four times a day (QID) | RESPIRATORY_TRACT | 0 refills | Status: AC | PRN
Start: 2023-03-07 — End: ?

## 2023-03-07 MED ORDER — ACETAMINOPHEN 325 MG PO TABS
975.0000 mg | ORAL_TABLET | Freq: Once | ORAL | Status: AC
  Administered 2023-03-07: 975 mg via ORAL

## 2023-03-07 MED ORDER — ONDANSETRON 4 MG PO TBDP: 4.0000 mg | ORAL_TABLET | Freq: Three times a day (TID) | ORAL | 0 refills | Status: AC | PRN

## 2023-03-07 MED ORDER — PROMETHAZINE-DM 6.25-15 MG/5ML PO SYRP: 5.0000 mL | ORAL_SOLUTION | Freq: Every evening | ORAL | 0 refills | Status: AC | PRN

## 2023-03-07 MED ORDER — KETOROLAC TROMETHAMINE 30 MG/ML IJ SOLN
30.0000 mg | Freq: Once | INTRAMUSCULAR | Status: AC
  Administered 2023-03-07: 30 mg via INTRAMUSCULAR

## 2023-03-07 NOTE — ED Triage Notes (Addendum)
Pt presents with complaints of late Friday starting to feel off, then Saturday she started experiencing cough, headache, chills, and body aches. Pt had 600 mg ibuprofen early this am.

## 2023-03-07 NOTE — Discharge Instructions (Signed)
You have the flu. Ibuprofen/tylenol as needed for fevers and body aches. Mucinex as needed for nasal congestion. Zofran as needed for nausea/vomiting. Promethazine DM at bedtime as needed for cough (causes drowsiness).  Albuterol inhaler every 4-6 hours as needed for shortness of breath and wheezing.  Tylenol every 6 hours as needed for fevers/chills. We gave you a shot of ketorolac (strong ibuprofen) today so do not take any ibuprofen for the next 8-12 hours.  If you develop any new or worsening symptoms or if your symptoms do not start to improve, please return here or follow-up with your primary care provider. If your symptoms are severe, please go to the emergency room.

## 2023-03-07 NOTE — ED Provider Notes (Signed)
[facility]    CSN: [id number] Arrival date & time: 03/07/23  1202      History   Chief Complaint Chief Complaint  Patient presents with   Chills    Body pain, nasty cough, back pain, chills. Headache - Entered by patient    HPI [facility] is a 48 y.o. female.   [facility] is a 48 y.o. female presenting for chief complaint of chills, body aches, fever, cough, nasal congestion, sore throat, and fatigue that started 4 days ago. Cough is dry and she reports intermittent shortness of breath associated with cough. No recent sick contacts with similar symptoms reported.  She is a current smoker, denies history of chronic respiratory problems. Currently febrile at 101.0, she has not had ibuprofen in the last 12 hours. Reports intermittent nausea without diarrhea, rash, dizziness, chest pain, palpitations. Denies history of chronic respiratory problems. Taking OTC medicines with some relief.      Past Medical History:  Diagnosis Date   Abnormal uterine bleeding    Allergy    Anxiety and depression 11/15/2019   Back pain    Bacterial vaginosis    Chest pain of uncertain etiology 04/03/2020   Constipation    Enlarged thyroid    Fibroids    Joint pain    OSA (obstructive sleep apnea) 02/14/2022   Pre-diabetes    Prediabetes    Recurrent vaginitis 01/23/2019   Seasonal allergies    Sleep apnea    Snores    Tobacco abuse     Patient Active Problem List   Diagnosis Date Noted   Ingrown toenail 05/23/2022   Insulin resistance 03/14/2022   Generalized obesity 03/14/2022   BMI 37.0-37.9, adult 03/14/2022   Low HDL (under 40) 03/01/2022   SOB (shortness of breath) on exertion 02/28/2022   Vitamin D deficiency 02/28/2022   Health care maintenance 02/28/2022   OSA (obstructive sleep apnea) 02/14/2022   Other fatigue 02/14/2022   Class 2 severe obesity with serious comorbidity and body mass index (BMI) of 37.0 to 37.9 in adult (HCC) 02/14/2022    Excessive daytime sleepiness 09/22/2021   Leg cramping 09/22/2021   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   Class 2 severe obesity due to excess calories with serious comorbidity and body mass index (BMI) of 37.0 to 37.9 in adult (HCC) 09/22/2021   Nocturia more than twice per night 09/22/2021   RLS (restless legs syndrome) 09/22/2021   Loud snoring 07/30/2021   Allergic rhinitis 06/15/2021   Pharyngitis 06/04/2021   Digestive problems 02/21/2020   Anxiety and depression 11/15/2019   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   Chronic urticaria 05/30/2019   Iron deficiency anemia due to chronic blood loss 05/09/2019   Fibroids 02/21/2019   Hirsutism 09/20/2018   Tobacco abuse    Prediabetes    Enlarged thyroid    Breast abscess     Past Surgical History:  Procedure Laterality Date   CESAREAN SECTION     X3   DILATATION & CURETTAGE/HYSTEROSCOPY WITH MYOSURE N/A 05/22/2019   Procedure: DILATATION & CURETTAGE/HYSTEROSCOPY WITH MYOSURE;  Surgeon: Tereso Newcomer, MD;  Location: [facility];  Service: Gynecology;  Laterality: N/A;   HYSTEROSCOPY N/A 05/22/2019   Procedure: Hysteroscopy with Hydrothermal Ablation ;  Surgeon: Tereso Newcomer, MD;  Location: [facility];  Service: Gynecology;  Laterality: N/A;   TUBAL LIGATION      OB History     Gravida  3   Para  Term      Preterm      AB      Living         SAB      IAB      Ectopic      Multiple      Live Births               Home Medications    Prior to Admission medications   Medication Sig Start Date End Date Taking? Authorizing Provider  albuterol (VENTOLIN HFA) 108 (90 Base) MCG/ACT inhaler Inhale 2 puffs into the lungs every 6 (six) hours as needed for wheezing or shortness of breath. 03/07/23  Yes Carlisle Beers, FNP  ondansetron (ZOFRAN-ODT) 4 MG disintegrating tablet Take 1 tablet (4 mg total) by mouth every 8 (eight) hours as needed for nausea or  vomiting. 03/07/23  Yes Carlisle Beers, FNP  promethazine-dextromethorphan (PROMETHAZINE-DM) 6.25-15 MG/5ML syrup Take 5 mLs by mouth at bedtime as needed for cough. 03/07/23  Yes Carlisle Beers, FNP  acetaminophen (TYLENOL) 500 MG tablet Take 500 mg by mouth daily as needed (pain).    [provider]  conjugated estrogens (PREMARIN) vaginal cream Place 1 Applicatorful vaginally at bedtime. Apply every night for one month then three times a week 05/09/22   Anyanwu, Jethro Bastos, MD  diphenhydrAMINE (BENADRYL) 25 MG tablet Take 25 mg by mouth daily as needed for itching.    [provider]  Probiotic Product (PROBIOTIC DAILY PO) Take by mouth.    [provider]  Vitamin D, Ergocalciferol, (DRISDOL) 1.25 MG (50000 UNIT) CAPS capsule TAKE 1 CAPSULE (50,000 UNITS TOTAL) BY MOUTH EVERY 7 (SEVEN) DAYS 04/07/22   Roswell Nickel, DO    Family History Family History  Problem Relation Age of Onset   Colon polyps Mother    Hypertension Mother    Depression Mother    Diabetes Father    Hypertension Father    Heart disease Father    Sudden death Father    Cancer Father    Diabetes Sister    Depression Paternal Grandmother    Allergic rhinitis Neg Hx    Angioedema Neg Hx    Asthma Neg Hx    Eczema Neg Hx    Immunodeficiency Neg Hx    Urticaria Neg Hx    Colon cancer Neg Hx    Esophageal cancer Neg Hx    Rectal cancer Neg Hx    Stomach cancer Neg Hx     Social History Social History   Tobacco Use   Smoking status: Some Days    Current packs/day: 0.25    Types: Cigarettes   Smokeless tobacco: Never  Substance Use Topics   Alcohol use: Yes    Comment: occ   Drug use: Yes    Types: Marijuana    Comment: last used 2 weeks ago     Allergies   Patient has no known allergies.   Review of Systems Review of Systems Per HPI  Physical Exam Triage Vital Signs ED Triage Vitals  Encounter Vitals Group     BP 03/07/23 1221 (!) 152/97     Systolic BP  Percentile --      Diastolic BP Percentile --      Pulse Rate 03/07/23 1221 78     Resp 03/07/23 1221 18     Temp 03/07/23 1221 99.9 F (37.7 C)     Temp src --      SpO2 03/07/23 1221  95 %     Weight --      Height --      Head Circumference --      Peak Flow --      Pain Score 03/07/23 1220 3     Pain Loc --      Pain Education --      Exclude from Growth Chart --    No data found.  Updated Vital Signs BP (!) 152/97   Pulse 78   Temp (!) 101.3 F (38.5 C) (Oral)   Resp 18   SpO2 95%   Visual Acuity Right Eye Distance:   Left Eye Distance:   Bilateral Distance:    Right Eye Near:   Left Eye Near:    Bilateral Near:     Physical Exam Vitals and nursing note reviewed.  Constitutional:      Appearance: She is ill-appearing. She is not toxic-appearing.  HENT:     Head: Normocephalic and atraumatic.     Right Ear: Hearing, tympanic membrane, ear canal and external ear normal.     Left Ear: Hearing, tympanic membrane, ear canal and external ear normal.     Nose: Congestion present.     Mouth/Throat:     Lips: Pink.     Mouth: Mucous membranes are moist. No injury or oral lesions.     Dentition: Normal dentition.     Tongue: No lesions.     Pharynx: Oropharynx is clear. Uvula midline. No pharyngeal swelling, oropharyngeal exudate, posterior oropharyngeal erythema, uvula swelling or postnasal drip.     Tonsils: No tonsillar exudate.  Eyes:     General: Lids are normal. Vision grossly intact. Gaze aligned appropriately.     Extraocular Movements: Extraocular movements intact.     Conjunctiva/sclera: Conjunctivae normal.  Neck:     Trachea: Trachea and phonation normal.  Cardiovascular:     Rate and Rhythm: Normal rate and regular rhythm.     Heart sounds: Normal heart sounds, S1 normal and S2 normal.  Pulmonary:     Effort: Pulmonary effort is normal. No respiratory distress.     Breath sounds: Normal breath sounds and air entry. No wheezing, rhonchi or rales.   Chest:     Chest wall: No tenderness.  Musculoskeletal:     Cervical back: Neck supple.     Right lower leg: No edema.     Left lower leg: No edema.  Lymphadenopathy:     Cervical: No cervical adenopathy.  Skin:    General: Skin is warm and dry.     Capillary Refill: Capillary refill takes less than 2 seconds.     Findings: No rash.  Neurological:     General: No focal deficit present.     Mental Status: She is alert and oriented to person, place, and time. Mental status is at baseline.     Cranial Nerves: No dysarthria or facial asymmetry.  Psychiatric:        Mood and Affect: Mood normal.        Speech: Speech normal.        Behavior: Behavior normal.        Thought Content: Thought content normal.        Judgment: Judgment normal.      UC Treatments / Results  Labs (all labs ordered are listed, but only abnormal results are displayed) Labs Reviewed  POC COVID19/FLU A&B COMBO - Abnormal; Notable for the following components:      Result Value  Influenza A Antigen, POC Positive (*)    All other components within normal limits    EKG   Radiology No results found.  Procedures Procedures (including critical care time)  Medications Ordered in UC Medications  ketorolac (TORADOL) 30 MG/ML injection 30 mg (30 mg Intramuscular Given 03/07/23 1310)  acetaminophen (TYLENOL) tablet 975 mg (975 mg Oral Given 03/07/23 1310)    Initial Impression / Assessment and Plan / UC Course  I have reviewed the triage vital signs and the nursing notes.  Pertinent labs & imaging results that were available during my care of the patient were reviewed by me and considered in my medical decision making (see chart for details).   1. Influenza A, fever, cigarette nicotine dependence without complication Flu A point of care testing positive. Lungs clear, vitals hemodynamically stable, therefore deferred imaging. Interventions in clinic: Ketorolac 30mg  IM and tylenol given for fevers. No  ibuprofen for 12 hours after ketorolac.  She is not a candidate for Tamiflu due to delayed presentation.  Recommend supportive care for further symptomatic relief as outlined in AVS.  Modes of transmission, quarantine recommendations, and hand hygiene discussed.  Counseled patient on potential for adverse effects with medications prescribed/recommended today, strict ER and return-to-clinic precautions discussed, patient verbalized understanding.    Final Clinical Impressions(s) / UC Diagnoses   Final diagnoses:  Influenza A  Fever, unspecified  Cigarette nicotine dependence without complication     Discharge Instructions      You have the flu. Ibuprofen/tylenol as needed for fevers and body aches. Mucinex as needed for nasal congestion. Zofran as needed for nausea/vomiting. Promethazine DM at bedtime as needed for cough (causes drowsiness).  Albuterol inhaler every 4-6 hours as needed for shortness of breath and wheezing.  Tylenol every 6 hours as needed for fevers/chills. We gave you a shot of ketorolac (strong ibuprofen) today so do not take any ibuprofen for the next 8-12 hours.  If you develop any new or worsening symptoms or if your symptoms do not start to improve, please return here or follow-up with your primary care provider. If your symptoms are severe, please go to the emergency room.     ED Prescriptions     Medication Sig Dispense Auth. Provider   promethazine-dextromethorphan (PROMETHAZINE-DM) 6.25-15 MG/5ML syrup Take 5 mLs by mouth at bedtime as needed for cough. 118 mL Reita May M, FNP   ondansetron (ZOFRAN-ODT) 4 MG disintegrating tablet Take 1 tablet (4 mg total) by mouth every 8 (eight) hours as needed for nausea or vomiting. 20 tablet Carlisle Beers, FNP   albuterol (VENTOLIN HFA) 108 (90 Base) MCG/ACT inhaler Inhale 2 puffs into the lungs every 6 (six) hours as needed for wheezing or shortness of breath. 8 g Carlisle Beers, FNP       PDMP not reviewed this encounter.   Carlisle Beers, [place] 03/07/23 1317

## 2023-03-10 ENCOUNTER — Ambulatory Visit: Payer: Medicaid Other

## 2023-03-11 ENCOUNTER — Ambulatory Visit
Admission: RE | Admit: 2023-03-11 | Discharge: 2023-03-11 | Disposition: A | Discharge status: Home / Self Care | Payer: Medicaid Other | Source: Ambulatory Visit | Attending: Nurse Practitioner | Admitting: Nurse Practitioner

## 2023-03-11 ENCOUNTER — Other Ambulatory Visit: Payer: Self-pay

## 2023-03-11 ENCOUNTER — Ambulatory Visit: Payer: Medicaid Other

## 2023-03-11 VITALS — BP 117/84 | HR 59 | Temp 98.3°F | Resp 16

## 2023-03-11 DIAGNOSIS — R059 Cough, unspecified: Secondary | ICD-10-CM

## 2023-03-11 DIAGNOSIS — J069 Acute upper respiratory infection, unspecified: Secondary | ICD-10-CM

## 2023-03-11 DIAGNOSIS — R052 Subacute cough: Secondary | ICD-10-CM | POA: Diagnosis not present

## 2023-03-11 MED ORDER — BENZONATATE 100 MG PO CAPS: 100.0000 mg | ORAL_CAPSULE | Freq: Three times a day (TID) | ORAL | 0 refills | Status: AC | PRN

## 2023-03-11 NOTE — Discharge Instructions (Signed)
You have a viral upper respiratory infection.  Symptoms should improve over the next week few days.  If you develop chest pain or shortness of breath, go to the emergency room.  Chest x-ray is negative for pneumonia today.  Some things that can make you feel better are: - Increased rest - Increasing fluid with water/sugar free electrolytes - Acetaminophen and ibuprofen as needed for fever/pain - Salt water gargling, chloraseptic spray and throat lozenges - OTC guaifenesin (Mucinex) 600 mg twice daily - Saline sinus flushes or a neti pot - Humidifying the air -Tessalon Perles every 8 hours as needed for dry cough

## 2023-03-11 NOTE — ED Triage Notes (Addendum)
Was told she had the flu last week. Not feeling better, went to an urgent care but they didn't have an xray tech to do a chest xray. Body aches have gotten better but still has cough, feeling irritable per patient. Has been taking tylenol and motrin. Has also been taking prescribed cough medication, nausea medication, using inhaler.

## 2023-03-11 NOTE — ED Provider Notes (Signed)
[facility]    CSN: [id number] Arrival date & time: 03/11/23  1402      History   Chief Complaint Chief Complaint  Patient presents with   Fever    HPI Chelsea Henson is a 48 y.o. female.   Patient presents today for 8-day history of congested and dry cough, chest pain when she coughs, runny and stuffy nose, sore throat, decreased appetite, fatigue, and feeling irritable.  She was seen approximately 3 days ago, tested positive for influenza A, and was prescribed cough suppressant medication which does help suppress the cough.  He also began taking Mucinex yesterday which helped to break up the cough.  She denies any recent fever, body aches or chills, chest tightness, headache, ear pain, abdominal pain, nausea/vomiting, and diarrhea.  She is concerned that she is not getting better yet.  She states "I am a terrible patient and do not like to feel bad for so long."    Past Medical History:  Diagnosis Date   Abnormal uterine bleeding    Allergy    Anxiety and depression 11/15/2019   Back pain    Bacterial vaginosis    Chest pain of uncertain etiology 04/03/2020   Constipation    Enlarged thyroid    Fibroids    Joint pain    OSA (obstructive sleep apnea) 02/14/2022   Pre-diabetes    Prediabetes    Recurrent vaginitis 01/23/2019   Seasonal allergies    Sleep apnea    Snores    Tobacco abuse     Patient Active Problem List   Diagnosis Date Noted   Ingrown toenail 05/23/2022   Insulin resistance 03/14/2022   Generalized obesity 03/14/2022   BMI 37.0-37.9, adult 03/14/2022   Low HDL (under 40) 03/01/2022   SOB (shortness of breath) on exertion 02/28/2022   Vitamin D deficiency 02/28/2022   Health care maintenance 02/28/2022   OSA (obstructive sleep apnea) 02/14/2022   Other fatigue 02/14/2022   Class 2 severe obesity with serious comorbidity and body mass index (BMI) of 37.0 to 37.9 in adult (HCC) 02/14/2022   Excessive daytime sleepiness 09/22/2021    Leg cramping 09/22/2021   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   Class 2 severe obesity due to excess calories with serious comorbidity and body mass index (BMI) of 37.0 to 37.9 in adult (HCC) 09/22/2021   Nocturia more than twice per night 09/22/2021   RLS (restless legs syndrome) 09/22/2021   Loud snoring 07/30/2021   Allergic rhinitis 06/15/2021   Pharyngitis 06/04/2021   Digestive problems 02/21/2020   Anxiety and depression 11/15/2019   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   Chronic urticaria 05/30/2019   Iron deficiency anemia due to chronic blood loss 05/09/2019   Fibroids 02/21/2019   Hirsutism 09/20/2018   Tobacco abuse    Prediabetes    Enlarged thyroid    Breast abscess     Past Surgical History:  Procedure Laterality Date   CESAREAN SECTION     X3   DILATATION & CURETTAGE/HYSTEROSCOPY WITH MYOSURE N/A 05/22/2019   Procedure: DILATATION & CURETTAGE/HYSTEROSCOPY WITH MYOSURE;  Surgeon: Tereso Newcomer, MD;  Location: [facility];  Service: Gynecology;  Laterality: N/A;   HYSTEROSCOPY N/A 05/22/2019   Procedure: Hysteroscopy with Hydrothermal Ablation ;  Surgeon: Tereso Newcomer, MD;  Location: [facility];  Service: Gynecology;  Laterality: N/A;   TUBAL LIGATION      OB History     Gravida  3  Para      Term      Preterm      AB      Living         SAB      IAB      Ectopic      Multiple      Live Births               Home Medications    Prior to Admission medications   Medication Sig Start Date End Date Taking? Authorizing Provider  benzonatate (TESSALON) 100 MG capsule Take 1 capsule (100 mg total) by mouth 3 (three) times daily as needed for cough. Do not take with alcohol or while driving or operating heavy machinery.  May cause drowsiness. 03/11/23  Yes Valentino Nose, NP  acetaminophen (TYLENOL) 500 MG tablet Take 500 mg by mouth daily as needed (pain).    [provider]   albuterol (VENTOLIN HFA) 108 (90 Base) MCG/ACT inhaler Inhale 2 puffs into the lungs every 6 (six) hours as needed for wheezing or shortness of breath. 03/07/23   Carlisle Beers, FNP  conjugated estrogens (PREMARIN) vaginal cream Place 1 Applicatorful vaginally at bedtime. Apply every night for one month then three times a week 05/09/22   Anyanwu, Jethro Bastos, MD  diphenhydrAMINE (BENADRYL) 25 MG tablet Take 25 mg by mouth daily as needed for itching.    [provider]  ondansetron (ZOFRAN-ODT) 4 MG disintegrating tablet Take 1 tablet (4 mg total) by mouth every 8 (eight) hours as needed for nausea or vomiting. 03/07/23   Carlisle Beers, FNP  Probiotic Product (PROBIOTIC DAILY PO) Take by mouth.    [provider]  promethazine-dextromethorphan (PROMETHAZINE-DM) 6.25-15 MG/5ML syrup Take 5 mLs by mouth at bedtime as needed for cough. 03/07/23   Carlisle Beers, FNP  Vitamin D, Ergocalciferol, (DRISDOL) 1.25 MG (50000 UNIT) CAPS capsule TAKE 1 CAPSULE (50,000 UNITS TOTAL) BY MOUTH EVERY 7 (SEVEN) DAYS 04/07/22   Roswell Nickel, DO    Family History Family History  Problem Relation Age of Onset   Colon polyps Mother    Hypertension Mother    Depression Mother    Diabetes Father    Hypertension Father    Heart disease Father    Sudden death Father    Cancer Father    Diabetes Sister    Depression Paternal Grandmother    Allergic rhinitis Neg Hx    Angioedema Neg Hx    Asthma Neg Hx    Eczema Neg Hx    Immunodeficiency Neg Hx    Urticaria Neg Hx    Colon cancer Neg Hx    Esophageal cancer Neg Hx    Rectal cancer Neg Hx    Stomach cancer Neg Hx     Social History Social History   Tobacco Use   Smoking status: Some Days    Current packs/day: 0.25    Types: Cigarettes   Smokeless tobacco: Never  Substance Use Topics   Alcohol use: Yes    Comment: occ   Drug use: Yes    Types: Marijuana    Comment: last used 2 weeks ago     Allergies    Patient has no known allergies.   Review of Systems Review of Systems Per HPI  Physical Exam Triage Vital Signs ED Triage Vitals  Encounter Vitals Group     BP 03/11/23 1419 106/70     Systolic BP Percentile --  Diastolic BP Percentile --      Pulse Rate 03/11/23 1419 (!) 59     Resp 03/11/23 1419 16     Temp 03/11/23 1419 98.3 F (36.8 C)     Temp Source 03/11/23 1419 Oral     SpO2 03/11/23 1419 97 %     Weight --      Height --      Head Circumference --      Peak Flow --      Pain Score 03/11/23 1422 4     Pain Loc --      Pain Education --      Exclude from Growth Chart --    No data found.  Updated Vital Signs BP 117/84   Pulse (!) 59   Temp 98.3 F (36.8 C) (Oral)   Resp 16   SpO2 97%   Visual Acuity Right Eye Distance:   Left Eye Distance:   Bilateral Distance:    Right Eye Near:   Left Eye Near:    Bilateral Near:     Physical Exam Vitals and nursing note reviewed.  Constitutional:      General: She is not in acute distress.    Appearance: Normal appearance. She is not ill-appearing or toxic-appearing.  HENT:     Head: Normocephalic and atraumatic.     Right Ear: Tympanic membrane, ear canal and external ear normal.     Left Ear: Tympanic membrane, ear canal and external ear normal.     Nose: No congestion or rhinorrhea.     Mouth/Throat:     Mouth: Mucous membranes are moist.     Pharynx: Oropharynx is clear. No oropharyngeal exudate or posterior oropharyngeal erythema.  Eyes:     General: No scleral icterus.    Extraocular Movements: Extraocular movements intact.  Cardiovascular:     Rate and Rhythm: Normal rate and regular rhythm.  Pulmonary:     Effort: Pulmonary effort is normal. No respiratory distress.     Breath sounds: Normal breath sounds. No wheezing, rhonchi or rales.  Musculoskeletal:     Cervical back: Normal range of motion and neck supple.  Lymphadenopathy:     Cervical: No cervical adenopathy.  Skin:    General:  Skin is warm and dry.     Coloration: Skin is not jaundiced or pale.     Findings: No erythema or rash.  Neurological:     Mental Status: She is alert and oriented to person, place, and time.  Psychiatric:        Behavior: Behavior is cooperative.      UC Treatments / Results  Labs (all labs ordered are listed, but only abnormal results are displayed) Labs Reviewed - No data to display  EKG   Radiology DG Chest 2 View Result Date: 03/11/2023 CLINICAL DATA:  Cough. EXAM: CHEST - 2 VIEW COMPARISON:  None Available. FINDINGS: The heart size and mediastinal contours are within normal limits. Both lungs are clear. The visualized skeletal structures are unremarkable. IMPRESSION: No active cardiopulmonary disease. Electronically Signed   By: Lupita Raider M.D.   On: 03/11/2023 15:01    Procedures Procedures (including critical care time)  Medications Ordered in UC Medications - No data to display  Initial Impression / Assessment and Plan / UC Course  I have reviewed the triage vital signs and the nursing notes.  Pertinent labs & imaging results that were available during my care of the patient were reviewed by me and considered  in my medical decision making (see chart for details).   Patient is well-appearing, normotensive, afebrile, not tachycardic, not tachypneic, oxygenating well on room air.    1. Subacute cough 2. Viral URI with cough Vitals and exam are reassuring today Chest x-ray imaging performed per patient request and negative-discussed results with patient while in urgent care Supportive care discussed including continue Mucinex, start Tessalon Perles Return and ER precautions discussed with patient  The patient was given the opportunity to ask questions.  All questions answered to their satisfaction.  The patient is in agreement to this plan.   Final Clinical Impressions(s) / UC Diagnoses   Final diagnoses:  Subacute cough  Viral URI with cough      Discharge Instructions      You have a viral upper respiratory infection.  Symptoms should improve over the next week few days.  If you develop chest pain or shortness of breath, go to the emergency room.  Chest x-ray is negative for pneumonia today.  Some things that can make you feel better are: - Increased rest - Increasing fluid with water/sugar free electrolytes - Acetaminophen and ibuprofen as needed for fever/pain - Salt water gargling, chloraseptic spray and throat lozenges - OTC guaifenesin (Mucinex) 600 mg twice daily - Saline sinus flushes or a neti pot - Humidifying the air -Tessalon Perles every 8 hours as needed for dry cough     ED Prescriptions     Medication Sig Dispense Auth. Provider   benzonatate (TESSALON) 100 MG capsule Take 1 capsule (100 mg total) by mouth 3 (three) times daily as needed for cough. Do not take with alcohol or while driving or operating heavy machinery.  May cause drowsiness. 21 capsule Valentino Nose, NP      PDMP not reviewed this encounter.   Valentino Nose, NP 03/11/23 [phone]

## 2023-03-24 ENCOUNTER — Ambulatory Visit
Admission: RE | Admit: 2023-03-24 | Discharge: 2023-03-24 | Disposition: A | Discharge status: Home / Self Care | Payer: Medicaid Other | Source: Ambulatory Visit | Attending: Oral Surgery | Admitting: Oral Surgery

## 2023-03-24 DIAGNOSIS — Z1231 Encounter for screening mammogram for malignant neoplasm of breast: Secondary | ICD-10-CM

## 2023-05-19 ENCOUNTER — Other Ambulatory Visit (HOSPITAL_COMMUNITY)
Admission: RE | Admit: 2023-05-19 | Discharge: 2023-05-19 | Disposition: A | Discharge status: Home / Self Care | Source: Ambulatory Visit | Attending: Family Medicine | Admitting: Family Medicine

## 2023-05-19 ENCOUNTER — Ambulatory Visit (INDEPENDENT_AMBULATORY_CARE_PROVIDER_SITE_OTHER)

## 2023-05-19 DIAGNOSIS — N760 Acute vaginitis: Secondary | ICD-10-CM

## 2023-05-19 NOTE — Progress Notes (Signed)
 SUBJECTIVE:  48 y.o. female who desires a STI screen. Notes vaginal itching,Denies abnormal vaginal discharge, bleeding or significant pelvic pain. No UTI symptoms. Denies history of known exposure to STD.  No LMP recorded. Patient has had an ablation.  OBJECTIVE:  She appears well.   ASSESSMENT:  STI Screen   PLAN:  Pt offered STI blood screening-not indicated GC, chlamydia, BV, Yeast and trichomonas per pt request  probe sent to lab.  Treatment: To be determined once lab results are received.  Pt follow up as needed.

## 2023-05-23 LAB — CERVICOVAGINAL ANCILLARY ONLY
Bacterial Vaginitis (gardnerella): NEGATIVE
Candida Glabrata: NEGATIVE
Candida Vaginitis: NEGATIVE
Chlamydia: NEGATIVE
Comment: NEGATIVE
Comment: NEGATIVE
Comment: NEGATIVE
Comment: NEGATIVE
Comment: NEGATIVE
Comment: NORMAL
Neisseria Gonorrhea: NEGATIVE
Trichomonas: NEGATIVE

## 2023-05-29 ENCOUNTER — Ambulatory Visit: Admitting: Obstetrics and Gynecology

## 2023-06-05 DIAGNOSIS — G4733 Obstructive sleep apnea (adult) (pediatric): Secondary | ICD-10-CM | POA: Diagnosis not present

## 2023-06-22 ENCOUNTER — Ambulatory Visit: Admitting: Obstetrics & Gynecology

## 2023-06-22 ENCOUNTER — Other Ambulatory Visit (HOSPITAL_COMMUNITY)
Admission: RE | Admit: 2023-06-22 | Discharge: 2023-06-22 | Disposition: A | Discharge status: Home / Self Care | Source: Ambulatory Visit | Attending: Obstetrics & Gynecology | Admitting: Obstetrics & Gynecology

## 2023-06-22 ENCOUNTER — Other Ambulatory Visit

## 2023-06-22 ENCOUNTER — Encounter: Payer: Self-pay | Admitting: Obstetrics & Gynecology

## 2023-06-22 VITALS — BP 149/97 | HR 64 | Wt 231.0 lb

## 2023-06-22 DIAGNOSIS — N760 Acute vaginitis: Secondary | ICD-10-CM | POA: Diagnosis not present

## 2023-06-22 DIAGNOSIS — N898 Other specified noninflammatory disorders of vagina: Secondary | ICD-10-CM

## 2023-06-22 NOTE — Progress Notes (Signed)
 GYNECOLOGY OFFICE VISIT NOTE  History:  Chelsea Henson is a 48 y.o. [phone] here today for evaluation of recurrent vaginitis.  Reports continued vaginal odor, had negative evaluations here in office in March and April 2025.  Perceived ammonia smell sometimes. No itching. Wants repeat swab today.  She denies any abnormal vaginal discharge, bleeding, pelvic pain or other concerns.  Past Medical History:  Diagnosis Date   Abnormal uterine bleeding    Allergy    Anxiety and depression 11/15/2019   Back pain    Bacterial vaginosis    Chest pain of uncertain etiology 04/03/2020   Constipation    Enlarged thyroid     Fibroids    Joint pain    OSA (obstructive sleep apnea) 02/14/2022   Pre-diabetes    Prediabetes    Recurrent vaginitis 01/23/2019   Seasonal allergies    Sleep apnea    Snores    Tobacco abuse     Past Surgical History:  Procedure Laterality Date   CESAREAN SECTION     X3   DILATATION & CURETTAGE/HYSTEROSCOPY WITH MYOSURE N/A 05/22/2019   Procedure: DILATATION & CURETTAGE/HYSTEROSCOPY WITH MYOSURE;  Surgeon: Julianne Octave, MD;  Location: [facility];  Service: Gynecology;  Laterality: N/A;   HYSTEROSCOPY N/A 05/22/2019   Procedure: Hysteroscopy with Hydrothermal Ablation ;  Surgeon: Julianne Octave, MD;  Location: [facility];  Service: Gynecology;  Laterality: N/A;   TUBAL LIGATION      The following portions of the patient's history were reviewed and updated as appropriate: allergies, current medications, past family history, past medical history, past social history, past surgical history and problem list.   Health Maintenance:  Normal pap and negative HRHPV on 05/09/2022.  Normal mammogram on 03/24/2023.   Review of Systems:  Pertinent items noted in HPI and remainder of comprehensive ROS otherwise negative.  Physical Exam:  BP (!) 149/97   Pulse 64   Wt 231 lb (104.8 kg)   BMI 38.44 kg/m  CONSTITUTIONAL:  Well-developed, well-nourished female in no acute distress.  HEENT:  Normocephalic, atraumatic. External right and left ear normal. No scleral icterus.  NECK: Normal range of motion, supple, no masses noted on observation SKIN: No rash noted. Not diaphoretic. No erythema. No pallor. MUSCULOSKELETAL: Normal range of motion. No edema noted. NEUROLOGIC: Alert and oriented to person, place, and time. Normal muscle tone coordination. No cranial nerve deficit noted on observation. PSYCHIATRIC: Normal mood and affect. Normal behavior. Normal judgment and thought content. CARDIOVASCULAR: Normal heart rate noted RESPIRATORY: Effort and breath sounds normal, no problems with respiration noted ABDOMEN: No masses or other overt distention noted on observation. No tenderness.   PELVIC: Normal appearing external genitalia; normal urethral meatus; normal appearing vaginal mucosa and cervix.  Some thin, white discharge noted, testing sample obtained. No odor noted.  Performed in the presence of a chaperone  Assessment and Plan:     1. Vaginal odor 2. Recurrent vaginitis (Primary) - Cervicovaginal ancillary only( Colon) done, will follow up results and manage accordingly. Had a long discussion about vaginal odor, the ammonia smell could be coming from urine, not her vagina. But will follow up swab results.  Discussed possibility of prolonged vaginal boric acid therapy to help maintain proper vaginal pH balance or prolonged vaginal metronidazole  (if positive for BV).  Will follow up results and manage accordingly. Proper vulvovaginal hygiene practices re-emphasized.  Please refer to After Visit Summary for other counseling recommendations.   Return for follow  up as recommended.    I spent 25 minutes dedicated to the care of this patient including pre-visit review of records, face to face time with the patient discussing her conditions and treatments, post visit ordering of medications and appropriate  tests or procedures, coordinating care and documenting this visit encounter.    Lenoard Rad, MD, FACOG Obstetrician & Gynecologist, [facility] for [employer], [facility]

## 2023-06-23 ENCOUNTER — Other Ambulatory Visit

## 2023-06-23 DIAGNOSIS — Z113 Encounter for screening for infections with a predominantly sexual mode of transmission: Secondary | ICD-10-CM

## 2023-06-23 LAB — CERVICOVAGINAL ANCILLARY ONLY
Bacterial Vaginitis (gardnerella): NEGATIVE
Candida Glabrata: NEGATIVE
Candida Vaginitis: NEGATIVE
Chlamydia: NEGATIVE
Comment: NEGATIVE
Comment: NEGATIVE
Comment: NEGATIVE
Comment: NEGATIVE
Comment: NEGATIVE
Comment: NORMAL
Neisseria Gonorrhea: NEGATIVE
Trichomonas: NEGATIVE

## 2023-06-24 LAB — RPR+HBSAG+HCVAB+...
HIV Screen 4th Generation wRfx: NONREACTIVE
Hep C Virus Ab: NONREACTIVE
Hepatitis B Surface Ag: NEGATIVE
RPR Ser Ql: NONREACTIVE

## 2023-06-26 ENCOUNTER — Ambulatory Visit: Payer: Self-pay | Admitting: Obstetrics & Gynecology

## 2023-09-26 DIAGNOSIS — J029 Acute pharyngitis, unspecified: Secondary | ICD-10-CM | POA: Diagnosis not present

## 2023-09-26 DIAGNOSIS — Z20822 Contact with and (suspected) exposure to covid-19: Secondary | ICD-10-CM | POA: Diagnosis not present

## 2023-09-26 DIAGNOSIS — J0100 Acute maxillary sinusitis, unspecified: Secondary | ICD-10-CM | POA: Diagnosis not present

## 2023-10-05 ENCOUNTER — Emergency Department (HOSPITAL_COMMUNITY)

## 2023-10-05 ENCOUNTER — Other Ambulatory Visit: Payer: Self-pay

## 2023-10-05 ENCOUNTER — Emergency Department (HOSPITAL_COMMUNITY)
Admission: EM | Admit: 2023-10-05 | Discharge: 2023-10-05 | Disposition: A | Discharge status: Home / Self Care | Attending: Emergency Medicine | Admitting: Emergency Medicine

## 2023-10-05 DIAGNOSIS — J329 Chronic sinusitis, unspecified: Secondary | ICD-10-CM | POA: Diagnosis not present

## 2023-10-05 DIAGNOSIS — B349 Viral infection, unspecified: Secondary | ICD-10-CM | POA: Diagnosis not present

## 2023-10-05 DIAGNOSIS — R0789 Other chest pain: Secondary | ICD-10-CM | POA: Diagnosis not present

## 2023-10-05 DIAGNOSIS — R079 Chest pain, unspecified: Secondary | ICD-10-CM | POA: Diagnosis not present

## 2023-10-05 DIAGNOSIS — R0981 Nasal congestion: Secondary | ICD-10-CM | POA: Diagnosis present

## 2023-10-05 LAB — CBC
HCT: 44.1 % (ref 36.0–46.0)
Hemoglobin: 14.3 g/dL (ref 12.0–15.0)
MCH: 29.2 pg (ref 26.0–34.0)
MCHC: 32.4 g/dL (ref 30.0–36.0)
MCV: 90 fL (ref 80.0–100.0)
Platelets: 250 K/uL (ref 150–400)
RBC: 4.9 MIL/uL (ref 3.87–5.11)
RDW: 14.4 % (ref 11.5–15.5)
WBC: 5.2 K/uL (ref 4.0–10.5)
nRBC: 0 % (ref 0.0–0.2)

## 2023-10-05 LAB — HCG, SERUM, QUALITATIVE: Preg, Serum: NEGATIVE

## 2023-10-05 LAB — RESP PANEL BY RT-PCR (RSV, FLU A&B, COVID)  RVPGX2
Influenza A by PCR: NEGATIVE
Influenza B by PCR: NEGATIVE
Resp Syncytial Virus by PCR: NEGATIVE
SARS Coronavirus 2 by RT PCR: NEGATIVE

## 2023-10-05 LAB — BASIC METABOLIC PANEL WITH GFR
Anion gap: 9 (ref 5–15)
BUN: 18 mg/dL (ref 6–20)
CO2: 27 mmol/L (ref 22–32)
Calcium: 9.1 mg/dL (ref 8.9–10.3)
Chloride: 103 mmol/L (ref 98–111)
Creatinine, Ser: 0.91 mg/dL (ref 0.44–1.00)
GFR, Estimated: >60 mL/min (ref >60)
Glucose, Bld: 116 mg/dL — ABNORMAL HIGH (ref 70–99)
Potassium: 3.9 mmol/L (ref 3.5–5.1)
Sodium: 139 mmol/L (ref 135–145)

## 2023-10-05 LAB — TROPONIN I (HIGH SENSITIVITY): Troponin I (High Sensitivity): 5 ng/L (ref <18)

## 2023-10-05 MED ORDER — ALBUTEROL SULFATE HFA 108 (90 BASE) MCG/ACT IN AERS
2.0000 | INHALATION_SPRAY | RESPIRATORY_TRACT | Status: DC | PRN
  Filled 2023-10-05: qty 6.7

## 2023-10-05 NOTE — ED Provider Notes (Signed)
 [place] [facility] Provider Note   CSN: [id number] Arrival date & time: 10/05/23  9255     Patient presents with: Chest Pain and Generalized Body Aches   Chelsea Henson is a 48 y.o. female.   Patient to ED with symptoms of nasal congestion, facial pressure, sore throat, body aches for the past several weeks. Seen 8/11 at Urgent Care and given Augmentin and a 6-day Medrol dose taper. She states she finished the steroid but was unable to complete the Augmentin longer than 5 days secondary to GI burning and upset. She reports minimal change in symptoms while taking the medication. No vomiting or ongoing GI symptoms. She presents today for evaluation of continued URI symptoms above and onset of chest tightness without cough. No fever at any time.   The history is provided by the patient. No language interpreter was used.  Chest Pain      Prior to Admission medications   Medication Sig Start Date End Date Taking? Authorizing Provider  acetaminophen  (TYLENOL ) 500 MG tablet Take 500 mg by mouth daily as needed (pain).    [provider]  albuterol  (VENTOLIN  HFA) 108 (90 Base) MCG/ACT inhaler Inhale 2 puffs into the lungs every 6 (six) hours as needed for wheezing or shortness of breath. 03/07/23   Enedelia Dorna HERO, FNP  benzonatate  (TESSALON ) 100 MG capsule Take 1 capsule (100 mg total) by mouth 3 (three) times daily as needed for cough. Do not take with alcohol or while driving or operating heavy machinery.  May cause drowsiness. Patient not taking: Reported on 06/22/2023 03/11/23   Chandra Harlene LABOR, NP  conjugated estrogens  (PREMARIN ) vaginal cream Place 1 Applicatorful vaginally at bedtime. Apply every night for one month then three times a week Patient not taking: Reported on 06/22/2023 05/09/22   Herchel Gloris LABOR, MD  diphenhydrAMINE  (BENADRYL ) 25 MG tablet Take 25 mg by mouth daily as needed for itching. Patient not taking: Reported  on 06/22/2023    [provider]  ondansetron  (ZOFRAN -ODT) 4 MG disintegrating tablet Take 1 tablet (4 mg total) by mouth every 8 (eight) hours as needed for nausea or vomiting. 03/07/23   Enedelia Dorna HERO, FNP  Probiotic Product (PROBIOTIC DAILY PO) Take by mouth.    [provider]  promethazine -dextromethorphan (PROMETHAZINE -DM) 6.25-15 MG/5ML syrup Take 5 mLs by mouth at bedtime as needed for cough. Patient not taking: Reported on 06/22/2023 03/07/23   Enedelia Dorna HERO, FNP  Vitamin D , Ergocalciferol , (DRISDOL ) 1.25 MG (50000 UNIT) CAPS capsule TAKE 1 CAPSULE (50,000 UNITS TOTAL) BY MOUTH EVERY 7 (SEVEN) DAYS Patient not taking: Reported on 06/22/2023 04/07/22   [name] A, DO    Allergies: Patient has no known allergies.    Review of Systems  Cardiovascular:  Positive for chest pain.    Updated Vital Signs BP 123/71 (BP Location: Right Arm)   Pulse (!) 59   Temp 98.5 F (36.9 C) (Oral)   Resp 16   Ht 5' 5 (1.651 m)   Wt 98.4 kg   SpO2 100%   BMI 36.11 kg/m   Physical Exam Vitals and nursing note reviewed.  Constitutional:      Appearance: She is well-developed.  HENT:     Head: Normocephalic.     Right Ear: Tympanic membrane normal.     Left Ear: Tympanic membrane normal.     Nose: Mucosal edema present.     Right Sinus: Maxillary sinus tenderness present.  Left Sinus: Maxillary sinus tenderness present.     Mouth/Throat:     Lips: Pink.     Mouth: Mucous membranes are moist.     Tongue: No lesions.     Pharynx: Oropharynx is clear.     Tonsils: No tonsillar exudate.  Cardiovascular:     Rate and Rhythm: Normal rate and regular rhythm.     Heart sounds: No murmur heard. Pulmonary:     Effort: Pulmonary effort is normal.     Breath sounds: Normal breath sounds. No wheezing, rhonchi or rales.  Chest:     Chest wall: Tenderness present.  Abdominal:     General: Bowel sounds are normal.     Palpations: Abdomen is soft.      Tenderness: There is no abdominal tenderness. There is no guarding or rebound.  Musculoskeletal:        General: Normal range of motion.     Cervical back: Normal range of motion and neck supple.  Lymphadenopathy:     Cervical: No cervical adenopathy.  Skin:    General: Skin is warm and dry.     Findings: No rash.  Neurological:     General: No focal deficit present.     Mental Status: She is alert and oriented to person, place, and time.     (all labs ordered are listed, but only abnormal results are displayed) Labs Reviewed  BASIC METABOLIC PANEL WITH GFR - Abnormal; Notable for the following components:      Result Value   Glucose, Bld 116 (*)    All other components within normal limits  RESP PANEL BY RT-PCR (RSV, FLU A&B, COVID)  [id number]  CBC  HCG, SERUM, QUALITATIVE  TROPONIN I (HIGH SENSITIVITY)  TROPONIN I (HIGH SENSITIVITY)   Results for orders placed or performed during the hospital encounter of 10/05/23  Resp panel by RT-PCR (RSV, Flu A&B, Covid) Anterior Nasal Swab   Collection Time: 10/05/23  7:59 AM   Specimen: Anterior Nasal Swab  Result Value Ref Range   SARS Coronavirus 2 by RT PCR NEGATIVE NEGATIVE   Influenza A by PCR NEGATIVE NEGATIVE   Influenza B by PCR NEGATIVE NEGATIVE   Resp Syncytial Virus by PCR NEGATIVE NEGATIVE  Basic metabolic panel   Collection Time: 10/05/23  7:59 AM  Result Value Ref Range   Sodium 139 135 - 145 mmol/L   Potassium 3.9 3.5 - 5.1 mmol/L   Chloride 103 98 - 111 mmol/L   CO2 27 22 - 32 mmol/L   Glucose, Bld 116 (H) 70 - 99 mg/dL   BUN 18 6 - 20 mg/dL   Creatinine, Ser 9.08 0.44 - 1.00 mg/dL   Calcium 9.1 8.9 - 89.6 mg/dL   GFR, Estimated >39 >39 mL/min   Anion gap 9 5 - 15  CBC   Collection Time: 10/05/23  7:59 AM  Result Value Ref Range   WBC 5.2 4.0 - 10.5 K/uL   RBC 4.90 3.87 - 5.11 MIL/uL   Hemoglobin 14.3 12.0 - 15.0 g/dL   HCT 55.8 63.9 - 53.9 %   MCV 90.0 80.0 - 100.0 fL   MCH 29.2 26.0 - 34.0 pg   MCHC 32.4  30.0 - 36.0 g/dL   RDW 85.5 88.4 - 84.4 %   Platelets 250 150 - 400 K/uL   nRBC 0.0 0.0 - 0.2 %  hCG, serum, qualitative   Collection Time: 10/05/23  7:59 AM  Result Value Ref Range   Preg, Serum NEGATIVE  NEGATIVE  Troponin I (High Sensitivity)   Collection Time: 10/05/23  7:59 AM  Result Value Ref Range   Troponin I (High Sensitivity) 5 <18 ng/L     EKG: None  Radiology: DG Chest 2 View Result Date: 10/05/2023 EXAM: 2 VIEW(S) XRAY OF THE CHEST 10/05/2023 08:22:17 AM COMPARISON: 03/11/2023 CLINICAL HISTORY: Chest pain. Per chart - Pt. Stated, 2 weeks ago I had a respiratory infection and given an antibiotic and steroid. I only took 1/2 cause it was tearing my stomach up. My chest has been hurting for a couple of weeks. All of my bones feel achy. I was tested for a lot. I just want to be tested for everything. I went to UC and was told I had a sinus infection and given the medication. FINDINGS: LUNGS AND PLEURA: No focal pulmonary opacity. No pulmonary edema. No pleural effusion. No pneumothorax. HEART AND MEDIASTINUM: No acute abnormality of the cardiac and mediastinal silhouettes. BONES AND SOFT TISSUES: Degenerative spine changes. No acute osseous abnormality. IMPRESSION: 1. No acute process. Electronically signed by: Waddell Calk MD 10/05/2023 08:36 AM EDT RP Workstation: [id number]     Procedures   Medications Ordered in the ED  albuterol  (VENTOLIN  HFA) 108 (90 Base) MCG/ACT inhaler 2 puff (has no administration in time range)    Clinical Course as of 10/05/23 0946  Thu Oct 05, 2023  0942 Patient with URI symptoms x 2-3 weeks without fever. Has taken steroid dosepack, 6-day, and 5 days of Augmentin without little change. Now with chest tightness. Exam is reassuring and labs without indicative pathology. Viral panel negative. CXR clear. Troponin 5. Did not repeat troponin.   She is well appearing. Discussed likely viral nature of illness. Did not recommend repeat antibiotics and  patient agrees. She will continue supportive care and follow up with PCP in one week for recheck.  [SU]    Clinical Course User Index [SU] Odell Balls, PA-C                                 Medical Decision Making Amount and/or Complexity of Data Reviewed Labs: ordered. Radiology: ordered.        Final diagnoses:  Viral illness    ED Discharge Orders     None          Odell Balls, PA-C 10/05/23 9053    Yolande Lamar BROCKS, MD 10/09/23 1030

## 2023-10-05 NOTE — ED Triage Notes (Signed)
 Pt. Stated, 2 weeks ago I had a respiratory infection and given an antibiotic and steroid. I only took 1/2 cause it was tearing my stomach up. My chest has been hurting for a couple  of weeks. All of my bones feel achy. I was tested for a lot . I just want to be tested for everything. I went to UC and was told I had a sinus infection and given the medication.

## 2023-10-05 NOTE — Discharge Instructions (Signed)
 As we discussed, follow up with your doctor in one week. Use the albuterol  inhaler IF this provides any symptomatic relief. Continue the steroid nasal spray as previously recommended. Tylenol  as needed. Lots of fluids and lots of rest.   Return to the ED with any new or concerning symptoms.

## 2023-10-16 ENCOUNTER — Ambulatory Visit: Payer: Self-pay | Admitting: Student

## 2023-10-16 ENCOUNTER — Encounter: Payer: Self-pay | Admitting: Student

## 2023-10-16 ENCOUNTER — Ambulatory Visit: Admitting: Student

## 2023-10-16 VITALS — BP 129/82 | HR 61 | Ht 65.0 in | Wt 221.5 lb

## 2023-10-16 DIAGNOSIS — E0590 Thyrotoxicosis, unspecified without thyrotoxic crisis or storm: Secondary | ICD-10-CM | POA: Diagnosis not present

## 2023-10-16 DIAGNOSIS — Z114 Encounter for screening for human immunodeficiency virus [HIV]: Secondary | ICD-10-CM | POA: Diagnosis not present

## 2023-10-16 DIAGNOSIS — D500 Iron deficiency anemia secondary to blood loss (chronic): Secondary | ICD-10-CM

## 2023-10-16 DIAGNOSIS — R252 Cramp and spasm: Secondary | ICD-10-CM

## 2023-10-16 DIAGNOSIS — K219 Gastro-esophageal reflux disease without esophagitis: Secondary | ICD-10-CM

## 2023-10-16 DIAGNOSIS — Z1159 Encounter for screening for other viral diseases: Secondary | ICD-10-CM | POA: Diagnosis not present

## 2023-10-16 DIAGNOSIS — Z0000 Encounter for general adult medical examination without abnormal findings: Secondary | ICD-10-CM

## 2023-10-16 DIAGNOSIS — R7303 Prediabetes: Secondary | ICD-10-CM

## 2023-10-16 DIAGNOSIS — Z23 Encounter for immunization: Secondary | ICD-10-CM | POA: Diagnosis not present

## 2023-10-16 LAB — POCT GLYCOSYLATED HEMOGLOBIN (HGB A1C): HbA1c, POC (prediabetic range): 6 % (ref 5.7–6.4)

## 2023-10-16 MED ORDER — PANTOPRAZOLE SODIUM 40 MG PO TBEC: 40.0000 mg | DELAYED_RELEASE_TABLET | Freq: Every day | ORAL | 3 refills | Status: AC

## 2023-10-16 NOTE — Assessment & Plan Note (Signed)
 Nocturnal leg cramps, suspect RLS based on chart review - BMP, magnesium - Follow-up in 2 to 4 weeks

## 2023-10-16 NOTE — Patient Instructions (Addendum)
 It was great to see you! Thank you for allowing me to participate in your care!   I recommend that you always bring your medications to each appointment as this makes it easy to ensure we are on the correct medications and helps us  not miss when refills are needed.  Our plans for today:  - We are checking some labs today, I will call you if they are abnormal will send you a MyChart message or a letter if they are normal.  If you do not hear about your labs in the next 2 weeks please let us  know. - Follow-up in 2-4 weeks  Take care and seek immediate care sooner if you develop any concerns. Please remember to show up 15 minutes before your scheduled appointment time!  Gladis Church, DO [facility]

## 2023-10-16 NOTE — Assessment & Plan Note (Signed)
 A1c of 6.0 today. - Lipid panel

## 2023-10-16 NOTE — Assessment & Plan Note (Signed)
CBC, ferritin today 

## 2023-10-16 NOTE — Progress Notes (Signed)
    SUBJECTIVE:   Chief compliant/HPI: annual examination  Chelsea Henson is a 48 y.o. who presents today for an annual exam.   CC: Leg cramps, thyroid , numbness of tongue, chest pain, GERD, HIV/hep B screening, iron deficiency Discussed that we cannot talk about all topics today, as time does not allow for this.  Briefly touched on the topics above, she will follow-up for further management.  With regard to leg cramps, patient voices this has been worsening over the last couple months.  On chart review, this been going on for years.  She has restless leg syndrome on her from list, although I do not know who diagnosed her with this.  We will check basic labs today.  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  Requesting thyroid  check today.  Numbness of tongue and mouth, several weeks ago, now resolved.  Chest pain is intermittent, not exertional.  She cannot describe exact sensation.  She feels that this may be related to GERD.  She does not take anything for GERD.  She would like her hemoglobin and iron checked, as she has not been taking iron supplementation has history of iron deficiency anemia.  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  OBJECTIVE:   BP 129/82   Pulse 61   Ht 5' 5 (1.651 m)   Wt 221 lb 8 oz (100.5 kg)   SpO2 100%   BMI 36.86 kg/m    General: NAD, pleasant Cardio: RRR, no MRG. Respiratory: CTAB, normal wob on RA Skin: Warm and dry  ASSESSMENT/PLAN:   Assessment & Plan Annual physical exam  Blood pressure reviewed and at goal  Considered the following items based upon USPSTF recommendations: Diabetes screening: ordered Screening for elevated cholesterol: ordered HIV testing: ordered Hepatitis C: discussed Hepatitis B: ordered GC/CT not at high risk and not ordered.   Discussed family history, BRCA testing not indicated. Cervical cancer screening: prior Pap reviewed, repeat  due in April 2029 Breast cancer screening: Already done Colorectal cancer screening: up to date on screening for CRC.  Follow up in 1 year or sooner if indicated.  MyChart Activation: Already signed up Iron deficiency anemia due to chronic blood loss -CBC, ferritin today Prediabetes A1c of 6.0 today. 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  Patient would like testing, given her prior numbness of the tongue. - TSH w/ reflex Gastroesophageal reflux disease, unspecified whether esophagitis present Symptoms of GERD - Trial Protonix  40 mg daily for 1 month  Gladis Church, DO [facility]

## 2023-10-17 LAB — BASIC METABOLIC PANEL WITH GFR
BUN/Creatinine Ratio: 13 (ref 9–23)
BUN: 11 mg/dL (ref 6–24)
CO2: 24 mmol/L (ref 20–29)
Calcium: 9.1 mg/dL (ref 8.7–10.2)
Chloride: 104 mmol/L (ref 96–106)
Creatinine, Ser: 0.87 mg/dL (ref 0.57–1.00)
Glucose: 105 mg/dL — ABNORMAL HIGH (ref 70–99)
Potassium: 3.8 mmol/L (ref 3.5–5.2)
Sodium: 141 mmol/L (ref 134–144)
eGFR: 82 mL/min/1.73 (ref >59)

## 2023-10-17 LAB — CBC WITH DIFFERENTIAL/PLATELET

## 2023-10-17 LAB — LIPID PANEL
Chol/HDL Ratio: 4.2 ratio (ref 0.0–4.4)
Cholesterol, Total: 144 mg/dL (ref 100–199)
HDL: 34 mg/dL — ABNORMAL LOW (ref >39)
LDL Chol Calc (NIH): 86 mg/dL (ref 0–99)
Triglycerides: 131 mg/dL (ref 0–149)
VLDL Cholesterol Cal: 24 mg/dL (ref 5–40)

## 2023-10-17 LAB — MAGNESIUM: Magnesium: 1.9 mg/dL (ref 1.6–2.3)

## 2023-10-17 LAB — T4F: T4,Free (Direct): 1.32 ng/dL (ref 0.82–1.77)

## 2023-10-17 LAB — FERRITIN: Ferritin: 68 ng/mL (ref 15–150)

## 2023-10-17 LAB — HEPATITIS B SURFACE ANTIBODY, QUANTITATIVE: Hepatitis B Surf Ab Quant: 41.4 m[IU]/mL

## 2023-10-17 LAB — TSH RFX ON ABNORMAL TO FREE T4: TSH: 0.402 u[IU]/mL — ABNORMAL LOW (ref 0.450–4.500)

## 2023-10-17 LAB — HIV ANTIBODY (ROUTINE TESTING W REFLEX): HIV Screen 4th Generation wRfx: NONREACTIVE
